# Patient Record
Sex: Female | Born: 1940 | Race: White | Hispanic: No | Smoking: Former smoker
Health system: Southern US, Community
[De-identification: ages and names within clinical notes are randomized; demographics above are authoritative.]

## PROBLEM LIST (undated history)

## (undated) DIAGNOSIS — D649 Anemia, unspecified: Secondary | ICD-10-CM

## (undated) DIAGNOSIS — F419 Anxiety disorder, unspecified: Secondary | ICD-10-CM

## (undated) DIAGNOSIS — D469 Myelodysplastic syndrome, unspecified: Secondary | ICD-10-CM

## (undated) DIAGNOSIS — F329 Major depressive disorder, single episode, unspecified: Secondary | ICD-10-CM

## (undated) HISTORY — PX: ABDOMINOPLASTY/PANNICULECTOMY: SHX5578

## (undated) HISTORY — PX: APPENDECTOMY: SHX54

## (undated) HISTORY — DX: Anxiety disorder, unspecified: F41.9

## (undated) HISTORY — DX: Myelodysplastic syndrome, unspecified: D46.9

## (undated) HISTORY — PX: OTHER SURGICAL HISTORY: SHX169

## (undated) HISTORY — PX: BREAST REDUCTION SURGERY: SHX8

## (undated) HISTORY — DX: Major depressive disorder, single episode, unspecified: F32.9

## (undated) HISTORY — PX: TUBAL LIGATION: SHX77

## (undated) HISTORY — DX: Hemochromatosis due to repeated red blood cell transfusions: E83.111

---

## 1997-11-12 ENCOUNTER — Ambulatory Visit (HOSPITAL_COMMUNITY): Admission: RE | Admit: 1997-11-12 | Discharge: 1997-11-12 | Payer: Self-pay | Admitting: *Deleted

## 1998-03-10 ENCOUNTER — Ambulatory Visit (HOSPITAL_COMMUNITY): Admission: RE | Admit: 1998-03-10 | Discharge: 1998-03-10 | Payer: Self-pay | Admitting: Gastroenterology

## 1998-04-13 ENCOUNTER — Ambulatory Visit (HOSPITAL_COMMUNITY): Admission: RE | Admit: 1998-04-13 | Discharge: 1998-04-13 | Payer: Self-pay | Admitting: *Deleted

## 1998-04-18 ENCOUNTER — Ambulatory Visit (HOSPITAL_BASED_OUTPATIENT_CLINIC_OR_DEPARTMENT_OTHER): Admission: RE | Admit: 1998-04-18 | Discharge: 1998-04-18 | Payer: Self-pay | Admitting: Specialist

## 1998-08-29 ENCOUNTER — Other Ambulatory Visit: Admission: RE | Admit: 1998-08-29 | Discharge: 1998-08-29 | Payer: Self-pay | Admitting: *Deleted

## 1999-04-17 ENCOUNTER — Ambulatory Visit (HOSPITAL_COMMUNITY): Admission: RE | Admit: 1999-04-17 | Discharge: 1999-04-17 | Payer: Self-pay | Admitting: *Deleted

## 1999-04-17 ENCOUNTER — Encounter: Payer: Self-pay | Admitting: *Deleted

## 1999-08-02 ENCOUNTER — Other Ambulatory Visit: Admission: RE | Admit: 1999-08-02 | Discharge: 1999-08-02 | Payer: Self-pay | Admitting: *Deleted

## 2000-04-18 ENCOUNTER — Encounter: Payer: Self-pay | Admitting: *Deleted

## 2000-04-18 ENCOUNTER — Ambulatory Visit (HOSPITAL_COMMUNITY): Admission: RE | Admit: 2000-04-18 | Discharge: 2000-04-18 | Payer: Self-pay | Admitting: *Deleted

## 2000-07-17 ENCOUNTER — Other Ambulatory Visit: Admission: RE | Admit: 2000-07-17 | Discharge: 2000-07-17 | Payer: Self-pay | Admitting: *Deleted

## 2001-02-19 ENCOUNTER — Ambulatory Visit (HOSPITAL_COMMUNITY): Admission: RE | Admit: 2001-02-19 | Discharge: 2001-02-19 | Payer: Self-pay | Admitting: *Deleted

## 2001-02-19 ENCOUNTER — Encounter: Payer: Self-pay | Admitting: *Deleted

## 2001-07-21 ENCOUNTER — Other Ambulatory Visit: Admission: RE | Admit: 2001-07-21 | Discharge: 2001-07-21 | Payer: Self-pay | Admitting: *Deleted

## 2001-09-05 ENCOUNTER — Observation Stay (HOSPITAL_COMMUNITY): Admission: EM | Admit: 2001-09-05 | Discharge: 2001-09-06 | Payer: Self-pay | Admitting: Emergency Medicine

## 2001-09-05 ENCOUNTER — Encounter: Payer: Self-pay | Admitting: Emergency Medicine

## 2002-02-20 ENCOUNTER — Encounter: Payer: Self-pay | Admitting: *Deleted

## 2002-02-20 ENCOUNTER — Ambulatory Visit (HOSPITAL_COMMUNITY): Admission: RE | Admit: 2002-02-20 | Discharge: 2002-02-20 | Payer: Self-pay | Admitting: *Deleted

## 2002-07-23 ENCOUNTER — Other Ambulatory Visit: Admission: RE | Admit: 2002-07-23 | Discharge: 2002-07-23 | Payer: Self-pay | Admitting: *Deleted

## 2003-02-23 ENCOUNTER — Ambulatory Visit (HOSPITAL_COMMUNITY): Admission: RE | Admit: 2003-02-23 | Discharge: 2003-02-23 | Payer: Self-pay | Admitting: *Deleted

## 2003-02-23 ENCOUNTER — Encounter: Payer: Self-pay | Admitting: *Deleted

## 2003-07-26 ENCOUNTER — Other Ambulatory Visit: Admission: RE | Admit: 2003-07-26 | Discharge: 2003-07-26 | Payer: Self-pay | Admitting: *Deleted

## 2004-02-24 ENCOUNTER — Ambulatory Visit (HOSPITAL_COMMUNITY): Admission: RE | Admit: 2004-02-24 | Discharge: 2004-02-24 | Payer: Self-pay | Admitting: *Deleted

## 2004-04-20 ENCOUNTER — Encounter: Admission: RE | Admit: 2004-04-20 | Discharge: 2004-04-20 | Payer: Self-pay | Admitting: *Deleted

## 2004-08-03 ENCOUNTER — Other Ambulatory Visit: Admission: RE | Admit: 2004-08-03 | Discharge: 2004-08-03 | Payer: Self-pay | Admitting: *Deleted

## 2005-02-27 ENCOUNTER — Ambulatory Visit (HOSPITAL_COMMUNITY): Admission: RE | Admit: 2005-02-27 | Discharge: 2005-02-27 | Payer: Self-pay | Admitting: *Deleted

## 2005-07-25 ENCOUNTER — Other Ambulatory Visit: Admission: RE | Admit: 2005-07-25 | Discharge: 2005-07-25 | Payer: Self-pay | Admitting: Gynecology

## 2006-03-05 ENCOUNTER — Ambulatory Visit (HOSPITAL_COMMUNITY): Admission: RE | Admit: 2006-03-05 | Discharge: 2006-03-05 | Payer: Self-pay | Admitting: Gynecology

## 2006-07-25 ENCOUNTER — Other Ambulatory Visit: Admission: RE | Admit: 2006-07-25 | Discharge: 2006-07-25 | Payer: Self-pay | Admitting: Gynecology

## 2006-09-16 ENCOUNTER — Ambulatory Visit: Payer: Self-pay | Admitting: Hematology & Oncology

## 2006-10-02 LAB — CBC & DIFF AND RETIC
Eosinophils Absolute: 0.6 10*3/uL — ABNORMAL HIGH (ref 0.0–0.5)
IRF: 0.35 — ABNORMAL HIGH (ref 0.130–0.330)
MONO#: 0.6 10*3/uL (ref 0.1–0.9)
NEUT#: 7 10*3/uL — ABNORMAL HIGH (ref 1.5–6.5)
Platelets: 471 10*3/uL — ABNORMAL HIGH (ref 145–400)
RBC: 3.01 10*6/uL — ABNORMAL LOW (ref 3.70–5.32)
RDW: 15.1 % — ABNORMAL HIGH (ref 11.3–14.5)
RETIC #: 88.2 10*3/uL (ref 19.7–115.1)
Retic %: 2.9 % — ABNORMAL HIGH (ref 0.4–2.3)
WBC: 12.3 10*3/uL — ABNORMAL HIGH (ref 3.9–10.0)
lymph#: 4 10*3/uL — ABNORMAL HIGH (ref 0.9–3.3)

## 2006-10-02 LAB — CHCC SMEAR

## 2006-10-04 LAB — FERRITIN: Ferritin: 793 ng/mL — ABNORMAL HIGH (ref 10–291)

## 2006-10-04 LAB — PROTEIN ELECTROPHORESIS, SERUM: Gamma Globulin: 12.3 % (ref 11.1–18.8)

## 2006-10-04 LAB — DIRECT ANTIGLOBULIN TEST (NOT AT ARMC)
DAT (Complement): NEGATIVE
DAT IgG: NEGATIVE

## 2006-10-04 LAB — VITAMIN B12: Vitamin B-12: 489 pg/mL (ref 211–911)

## 2006-10-04 LAB — HAPTOGLOBIN: Haptoglobin: 98 mg/dL (ref 16–200)

## 2006-10-16 ENCOUNTER — Ambulatory Visit (HOSPITAL_COMMUNITY): Admission: RE | Admit: 2006-10-16 | Discharge: 2006-10-16 | Payer: Self-pay | Admitting: Hematology & Oncology

## 2006-10-16 ENCOUNTER — Encounter: Payer: Self-pay | Admitting: Hematology & Oncology

## 2006-10-16 ENCOUNTER — Ambulatory Visit: Payer: Self-pay | Admitting: Hematology & Oncology

## 2006-11-14 ENCOUNTER — Ambulatory Visit: Payer: Self-pay | Admitting: Hematology & Oncology

## 2006-11-18 LAB — CBC WITH DIFFERENTIAL/PLATELET
BASO%: 0.9 % (ref 0.0–2.0)
LYMPH%: 35.9 % (ref 14.0–48.0)
MCHC: 35.5 g/dL (ref 32.0–36.0)
MONO#: 0.7 10*3/uL (ref 0.1–0.9)
RBC: 2.81 10*6/uL — ABNORMAL LOW (ref 3.70–5.32)
WBC: 12.7 10*3/uL — ABNORMAL HIGH (ref 3.9–10.0)
lymph#: 4.5 10*3/uL — ABNORMAL HIGH (ref 0.9–3.3)

## 2006-12-06 LAB — CBC & DIFF AND RETIC
Basophils Absolute: 0.4 10*3/uL — ABNORMAL HIGH (ref 0.0–0.1)
EOS%: 5.8 % (ref 0.0–7.0)
HGB: 10.5 g/dL — ABNORMAL LOW (ref 11.6–15.9)
IRF: 0.28 (ref 0.130–0.330)
MCH: 35.3 pg — ABNORMAL HIGH (ref 26.0–34.0)
NEUT#: 5 10*3/uL (ref 1.5–6.5)
RBC: 2.97 10*6/uL — ABNORMAL LOW (ref 3.70–5.32)
RDW: 16.3 % — ABNORMAL HIGH (ref 11.3–14.5)
RETIC #: 59.4 10*3/uL (ref 19.7–115.1)
Retic %: 2 % (ref 0.4–2.3)
lymph#: 4.4 10*3/uL — ABNORMAL HIGH (ref 0.9–3.3)

## 2006-12-06 LAB — CHCC SMEAR

## 2006-12-18 LAB — CBC WITH DIFFERENTIAL/PLATELET
BASO%: 0.3 % (ref 0.0–2.0)
EOS%: 4.1 % (ref 0.0–7.0)
MCH: 36 pg — ABNORMAL HIGH (ref 26.0–34.0)
MCHC: 34.9 g/dL (ref 32.0–36.0)
MCV: 103.1 fL — ABNORMAL HIGH (ref 81.0–101.0)
MONO%: 3.8 % (ref 0.0–13.0)
NEUT#: 7.4 10*3/uL — ABNORMAL HIGH (ref 1.5–6.5)
RBC: 3.05 10*6/uL — ABNORMAL LOW (ref 3.70–5.32)
RDW: 16.6 % — ABNORMAL HIGH (ref 11.3–14.5)

## 2006-12-30 ENCOUNTER — Ambulatory Visit: Payer: Self-pay | Admitting: Hematology & Oncology

## 2007-01-01 LAB — CBC WITH DIFFERENTIAL/PLATELET
BASO%: 1.6 % (ref 0.0–2.0)
Eosinophils Absolute: 0.5 10*3/uL (ref 0.0–0.5)
MCHC: 34.4 g/dL (ref 32.0–36.0)
MONO#: 0.4 10*3/uL (ref 0.1–0.9)
NEUT#: 5.8 10*3/uL (ref 1.5–6.5)
RBC: 3.23 10*6/uL — ABNORMAL LOW (ref 3.70–5.32)
RDW: 16.4 % — ABNORMAL HIGH (ref 11.3–14.5)
WBC: 11.1 10*3/uL — ABNORMAL HIGH (ref 3.9–10.0)

## 2007-01-14 ENCOUNTER — Encounter: Admission: RE | Admit: 2007-01-14 | Discharge: 2007-01-14 | Payer: Self-pay | Admitting: Family Medicine

## 2007-01-15 LAB — CBC WITH DIFFERENTIAL/PLATELET
BASO%: 1.4 % (ref 0.0–2.0)
Basophils Absolute: 0.2 10*3/uL — ABNORMAL HIGH (ref 0.0–0.1)
Eosinophils Absolute: 0.5 10*3/uL (ref 0.0–0.5)
HCT: 32.7 % — ABNORMAL LOW (ref 34.8–46.6)
HGB: 11.4 g/dL — ABNORMAL LOW (ref 11.6–15.9)
MCHC: 34.9 g/dL (ref 32.0–36.0)
MONO#: 0.5 10*3/uL (ref 0.1–0.9)
NEUT#: 6.2 10*3/uL (ref 1.5–6.5)
NEUT%: 55.2 % (ref 39.6–76.8)
WBC: 11.2 10*3/uL — ABNORMAL HIGH (ref 3.9–10.0)
lymph#: 3.9 10*3/uL — ABNORMAL HIGH (ref 0.9–3.3)

## 2007-01-22 LAB — CBC WITH DIFFERENTIAL/PLATELET
Basophils Absolute: 0.2 10*3/uL — ABNORMAL HIGH (ref 0.0–0.1)
EOS%: 4.3 % (ref 0.0–7.0)
HCT: 32.1 % — ABNORMAL LOW (ref 34.8–46.6)
HGB: 11.1 g/dL — ABNORMAL LOW (ref 11.6–15.9)
MCH: 35.6 pg — ABNORMAL HIGH (ref 26.0–34.0)
NEUT%: 56.9 % (ref 39.6–76.8)
Platelets: 614 10*3/uL — ABNORMAL HIGH (ref 145–400)
lymph#: 5 10*3/uL — ABNORMAL HIGH (ref 0.9–3.3)

## 2007-01-29 LAB — CBC WITH DIFFERENTIAL/PLATELET
Eosinophils Absolute: 0.6 10*3/uL — ABNORMAL HIGH (ref 0.0–0.5)
HCT: 32.3 % — ABNORMAL LOW (ref 34.8–46.6)
LYMPH%: 34.7 % (ref 14.0–48.0)
MCHC: 34.5 g/dL (ref 32.0–36.0)
MONO#: 0.5 10*3/uL (ref 0.1–0.9)
NEUT#: 6.2 10*3/uL (ref 1.5–6.5)
NEUT%: 54.5 % (ref 39.6–76.8)
Platelets: 335 10*3/uL (ref 145–400)
WBC: 11.4 10*3/uL — ABNORMAL HIGH (ref 3.9–10.0)

## 2007-02-12 LAB — CBC WITH DIFFERENTIAL/PLATELET
Basophils Absolute: 0.1 10*3/uL (ref 0.0–0.1)
Eosinophils Absolute: 0.4 10*3/uL (ref 0.0–0.5)
HCT: 32.3 % — ABNORMAL LOW (ref 34.8–46.6)
HGB: 11.3 g/dL — ABNORMAL LOW (ref 11.6–15.9)
LYMPH%: 36.7 % (ref 14.0–48.0)
MCV: 102.5 fL — ABNORMAL HIGH (ref 81.0–101.0)
MONO#: 0.5 10*3/uL (ref 0.1–0.9)
MONO%: 4.8 % (ref 0.0–13.0)
NEUT#: 5.6 10*3/uL (ref 1.5–6.5)
NEUT%: 53.6 % (ref 39.6–76.8)
Platelets: 344 10*3/uL (ref 145–400)
WBC: 10.4 10*3/uL — ABNORMAL HIGH (ref 3.9–10.0)

## 2007-02-24 ENCOUNTER — Ambulatory Visit: Payer: Self-pay | Admitting: Hematology & Oncology

## 2007-02-28 LAB — CBC WITH DIFFERENTIAL/PLATELET
BASO%: 0.6 % (ref 0.0–2.0)
HCT: 31.5 % — ABNORMAL LOW (ref 34.8–46.6)
LYMPH%: 32.9 % (ref 14.0–48.0)
MCHC: 35.4 g/dL (ref 32.0–36.0)
MCV: 102.9 fL — ABNORMAL HIGH (ref 81.0–101.0)
MONO#: 0.6 10*3/uL (ref 0.1–0.9)
MONO%: 4.4 % (ref 0.0–13.0)
NEUT%: 58.8 % (ref 39.6–76.8)
Platelets: 396 10*3/uL (ref 145–400)
RBC: 3.06 10*6/uL — ABNORMAL LOW (ref 3.70–5.32)
WBC: 12.8 10*3/uL — ABNORMAL HIGH (ref 3.9–10.0)

## 2007-03-10 ENCOUNTER — Ambulatory Visit (HOSPITAL_COMMUNITY): Admission: RE | Admit: 2007-03-10 | Discharge: 2007-03-10 | Payer: Self-pay | Admitting: Gynecology

## 2007-03-12 LAB — CBC & DIFF AND RETIC
BASO%: 0.8 % (ref 0.0–2.0)
Basophils Absolute: 0.1 10*3/uL (ref 0.0–0.1)
EOS%: 4.4 % (ref 0.0–7.0)
HCT: 32.9 % — ABNORMAL LOW (ref 34.8–46.6)
HGB: 11.5 g/dL — ABNORMAL LOW (ref 11.6–15.9)
MCH: 35.4 pg — ABNORMAL HIGH (ref 26.0–34.0)
MCHC: 34.8 g/dL (ref 32.0–36.0)
MCV: 101.6 fL — ABNORMAL HIGH (ref 81.0–101.0)
MONO%: 5.4 % (ref 0.0–13.0)
NEUT%: 60.1 % (ref 39.6–76.8)
RDW: 16.1 % — ABNORMAL HIGH (ref 11.3–14.5)

## 2007-04-02 LAB — CBC WITH DIFFERENTIAL/PLATELET
Basophils Absolute: 0.1 10*3/uL (ref 0.0–0.1)
Eosinophils Absolute: 0.5 10*3/uL (ref 0.0–0.5)
HGB: 9.9 g/dL — ABNORMAL LOW (ref 11.6–15.9)
LYMPH%: 27.9 % (ref 14.0–48.0)
MCV: 101.8 fL — ABNORMAL HIGH (ref 81.0–101.0)
MONO%: 5 % (ref 0.0–13.0)
NEUT#: 10.4 10*3/uL — ABNORMAL HIGH (ref 1.5–6.5)
NEUT%: 63.1 % (ref 39.6–76.8)
Platelets: 483 10*3/uL — ABNORMAL HIGH (ref 145–400)

## 2007-04-21 ENCOUNTER — Ambulatory Visit: Payer: Self-pay | Admitting: Hematology & Oncology

## 2007-04-23 LAB — CBC WITH DIFFERENTIAL/PLATELET
BASO%: 1.2 % (ref 0.0–2.0)
EOS%: 4.3 % (ref 0.0–7.0)
HCT: 28.3 % — ABNORMAL LOW (ref 34.8–46.6)
MCH: 35.7 pg — ABNORMAL HIGH (ref 26.0–34.0)
MCHC: 34.9 g/dL (ref 32.0–36.0)
MONO#: 0.7 10*3/uL (ref 0.1–0.9)
NEUT%: 49.9 % (ref 39.6–76.8)
RBC: 2.77 10*6/uL — ABNORMAL LOW (ref 3.70–5.32)
RDW: 15.8 % — ABNORMAL HIGH (ref 11.3–14.5)
WBC: 12 10*3/uL — ABNORMAL HIGH (ref 3.9–10.0)
lymph#: 4.7 10*3/uL — ABNORMAL HIGH (ref 0.9–3.3)

## 2007-05-14 LAB — CBC & DIFF AND RETIC
EOS%: 4 % (ref 0.0–7.0)
Eosinophils Absolute: 0.5 10*3/uL (ref 0.0–0.5)
HGB: 10.5 g/dL — ABNORMAL LOW (ref 11.6–15.9)
IRF: 0.36 — ABNORMAL HIGH (ref 0.130–0.330)
MCV: 103.7 fL — ABNORMAL HIGH (ref 81.0–101.0)
MONO%: 1.7 % (ref 0.0–13.0)
NEUT#: 8.6 10*3/uL — ABNORMAL HIGH (ref 1.5–6.5)
RBC: 2.92 10*6/uL — ABNORMAL LOW (ref 3.70–5.32)
RDW: 16.7 % — ABNORMAL HIGH (ref 11.3–14.5)
RETIC #: 62.5 10*3/uL (ref 19.7–115.1)
Retic %: 2.1 % (ref 0.4–2.3)
lymph#: 4 10*3/uL — ABNORMAL HIGH (ref 0.9–3.3)

## 2007-05-14 LAB — CHCC SMEAR

## 2007-06-04 LAB — CBC WITH DIFFERENTIAL/PLATELET
Basophils Absolute: 0.1 10*3/uL (ref 0.0–0.1)
EOS%: 3.8 % (ref 0.0–7.0)
Eosinophils Absolute: 0.5 10*3/uL (ref 0.0–0.5)
MCHC: 35.4 g/dL (ref 32.0–36.0)
MCV: 102.5 fL — ABNORMAL HIGH (ref 81.0–101.0)
NEUT%: 51.6 % (ref 39.6–76.8)
Platelets: 495 10*3/uL — ABNORMAL HIGH (ref 145–400)
RBC: 2.72 10*6/uL — ABNORMAL LOW (ref 3.70–5.32)
RDW: 17 % — ABNORMAL HIGH (ref 11.3–14.5)
WBC: 12.5 10*3/uL — ABNORMAL HIGH (ref 3.9–10.0)

## 2007-06-25 ENCOUNTER — Ambulatory Visit: Payer: Self-pay | Admitting: Hematology & Oncology

## 2007-06-27 LAB — CBC & DIFF AND RETIC
BASO%: 0.6 % (ref 0.0–2.0)
Basophils Absolute: 0.1 10*3/uL (ref 0.0–0.1)
EOS%: 3.6 % (ref 0.0–7.0)
HCT: 28.1 % — ABNORMAL LOW (ref 34.8–46.6)
LYMPH%: 30.4 % (ref 14.0–48.0)
MCH: 36.4 pg — ABNORMAL HIGH (ref 26.0–34.0)
MCHC: 35.2 g/dL (ref 32.0–36.0)
MCV: 103.5 fL — ABNORMAL HIGH (ref 81.0–101.0)
MONO%: 4.7 % (ref 0.0–13.0)
NEUT%: 60.7 % (ref 39.6–76.8)
Platelets: 490 10*3/uL — ABNORMAL HIGH (ref 145–400)
lymph#: 3.9 10*3/uL — ABNORMAL HIGH (ref 0.9–3.3)

## 2007-06-27 LAB — CHCC SMEAR

## 2007-07-11 LAB — CBC WITH DIFFERENTIAL/PLATELET
Basophils Absolute: 0.1 10*3/uL (ref 0.0–0.1)
EOS%: 4.1 % (ref 0.0–7.0)
Eosinophils Absolute: 0.5 10*3/uL (ref 0.0–0.5)
HCT: 32.3 % — ABNORMAL LOW (ref 34.8–46.6)
HGB: 11 g/dL — ABNORMAL LOW (ref 11.6–15.9)
MCH: 35 pg — ABNORMAL HIGH (ref 26.0–34.0)
MCV: 103 fL — ABNORMAL HIGH (ref 81.0–101.0)
MONO%: 7 % (ref 0.0–13.0)
NEUT#: 7.4 10*3/uL — ABNORMAL HIGH (ref 1.5–6.5)
NEUT%: 57.5 % (ref 39.6–76.8)

## 2007-07-25 LAB — CBC WITH DIFFERENTIAL/PLATELET
Basophils Absolute: 0.1 10*3/uL (ref 0.0–0.1)
EOS%: 2.7 % (ref 0.0–7.0)
Eosinophils Absolute: 0.3 10*3/uL (ref 0.0–0.5)
HGB: 11.3 g/dL — ABNORMAL LOW (ref 11.6–15.9)
LYMPH%: 32.3 % (ref 14.0–48.0)
MCH: 35.4 pg — ABNORMAL HIGH (ref 26.0–34.0)
MCV: 104.4 fL — ABNORMAL HIGH (ref 81.0–101.0)
MONO%: 3.4 % (ref 0.0–13.0)
NEUT#: 7.3 10*3/uL — ABNORMAL HIGH (ref 1.5–6.5)
Platelets: 418 10*3/uL — ABNORMAL HIGH (ref 145–400)
RBC: 3.18 10*6/uL — ABNORMAL LOW (ref 3.70–5.32)
RDW: 16.8 % — ABNORMAL HIGH (ref 11.3–14.5)

## 2007-07-29 ENCOUNTER — Other Ambulatory Visit: Admission: RE | Admit: 2007-07-29 | Discharge: 2007-07-29 | Payer: Self-pay | Admitting: Gynecology

## 2007-08-08 LAB — COMPREHENSIVE METABOLIC PANEL
ALT: 30 U/L (ref 0–35)
AST: 35 U/L (ref 0–37)
Alkaline Phosphatase: 85 U/L (ref 39–117)
BUN: 12 mg/dL (ref 6–23)
Calcium: 9.4 mg/dL (ref 8.4–10.5)
Creatinine, Ser: 0.61 mg/dL (ref 0.40–1.20)
Total Bilirubin: 1.4 mg/dL — ABNORMAL HIGH (ref 0.3–1.2)

## 2007-08-08 LAB — CBC & DIFF AND RETIC
BASO%: 0.8 % (ref 0.0–2.0)
EOS%: 5 % (ref 0.0–7.0)
HGB: 10.3 g/dL — ABNORMAL LOW (ref 11.6–15.9)
IRF: 0.37 — ABNORMAL HIGH (ref 0.130–0.330)
MCH: 35 pg — ABNORMAL HIGH (ref 26.0–34.0)
MCHC: 34.1 g/dL (ref 32.0–36.0)
MONO#: 0.5 10*3/uL (ref 0.1–0.9)
RDW: 16.4 % — ABNORMAL HIGH (ref 11.3–14.5)
RETIC #: 57.5 10*3/uL (ref 19.7–115.1)
WBC: 14.9 10*3/uL — ABNORMAL HIGH (ref 3.9–10.0)
lymph#: 4.1 10*3/uL — ABNORMAL HIGH (ref 0.9–3.3)

## 2007-08-08 LAB — FERRITIN: Ferritin: 1091 ng/mL — ABNORMAL HIGH (ref 10–291)

## 2007-08-20 ENCOUNTER — Ambulatory Visit: Payer: Self-pay | Admitting: Oncology

## 2007-08-22 LAB — CBC WITH DIFFERENTIAL/PLATELET
Basophils Absolute: 0.1 10*3/uL (ref 0.0–0.1)
Eosinophils Absolute: 0.6 10*3/uL — ABNORMAL HIGH (ref 0.0–0.5)
HCT: 30.3 % — ABNORMAL LOW (ref 34.8–46.6)
HGB: 10.6 g/dL — ABNORMAL LOW (ref 11.6–15.9)
MCV: 102.7 fL — ABNORMAL HIGH (ref 81.0–101.0)
MONO%: 6.3 % (ref 0.0–13.0)
NEUT#: 6.2 10*3/uL (ref 1.5–6.5)
Platelets: 439 10*3/uL — ABNORMAL HIGH (ref 145–400)
RDW: 16.5 % — ABNORMAL HIGH (ref 11.3–14.5)

## 2007-09-05 LAB — CBC WITH DIFFERENTIAL/PLATELET
BASO%: 0.7 % (ref 0.0–2.0)
Basophils Absolute: 0.1 10*3/uL (ref 0.0–0.1)
EOS%: 2.6 % (ref 0.0–7.0)
HGB: 11.1 g/dL — ABNORMAL LOW (ref 11.6–15.9)
MCH: 36.3 pg — ABNORMAL HIGH (ref 26.0–34.0)
RDW: 16.3 % — ABNORMAL HIGH (ref 11.3–14.5)
lymph#: 3.5 10*3/uL — ABNORMAL HIGH (ref 0.9–3.3)

## 2007-09-19 LAB — CBC & DIFF AND RETIC
BASO%: 0.7 % (ref 0.0–2.0)
EOS%: 4.2 % (ref 0.0–7.0)
HCT: 31.8 % — ABNORMAL LOW (ref 34.8–46.6)
LYMPH%: 33.2 % (ref 14.0–48.0)
MCH: 35.8 pg — ABNORMAL HIGH (ref 26.0–34.0)
MCHC: 34.3 g/dL (ref 32.0–36.0)
MCV: 104.5 fL — ABNORMAL HIGH (ref 81.0–101.0)
MONO%: 7.5 % (ref 0.0–13.0)
NEUT%: 54.4 % (ref 39.6–76.8)
Platelets: 396 10*3/uL (ref 145–400)

## 2007-09-22 LAB — FERRITIN: Ferritin: 1179 ng/mL — ABNORMAL HIGH (ref 10–291)

## 2007-09-26 ENCOUNTER — Emergency Department (HOSPITAL_COMMUNITY): Admission: EM | Admit: 2007-09-26 | Discharge: 2007-09-26 | Payer: Self-pay | Admitting: Emergency Medicine

## 2007-11-11 ENCOUNTER — Ambulatory Visit: Payer: Self-pay | Admitting: Oncology

## 2007-11-14 LAB — CBC WITH DIFFERENTIAL/PLATELET
BASO%: 0.4 % (ref 0.0–2.0)
LYMPH%: 31 % (ref 14.0–48.0)
MCHC: 34.8 g/dL (ref 32.0–36.0)
MONO#: 0.9 10*3/uL (ref 0.1–0.9)
RBC: 2.5 10*6/uL — ABNORMAL LOW (ref 3.70–5.32)
RDW: 16.3 % — ABNORMAL HIGH (ref 11.3–14.5)
WBC: 13.9 10*3/uL — ABNORMAL HIGH (ref 3.9–10.0)
lymph#: 4.3 10*3/uL — ABNORMAL HIGH (ref 0.9–3.3)

## 2007-11-14 LAB — MORPHOLOGY: PLT EST: INCREASED

## 2008-01-07 ENCOUNTER — Ambulatory Visit: Payer: Self-pay | Admitting: Oncology

## 2008-01-09 LAB — MORPHOLOGY: PLT EST: INCREASED

## 2008-01-09 LAB — CBC WITH DIFFERENTIAL/PLATELET
Basophils Absolute: 0.2 10*3/uL — ABNORMAL HIGH (ref 0.0–0.1)
Eosinophils Absolute: 1.1 10*3/uL — ABNORMAL HIGH (ref 0.0–0.5)
MCV: 103.3 fL — ABNORMAL HIGH (ref 81.0–101.0)
NEUT%: 53.2 % (ref 39.6–76.8)
Platelets: 511 10*3/uL — ABNORMAL HIGH (ref 145–400)
RDW: 17.9 % — ABNORMAL HIGH (ref 11.3–14.5)
WBC: 15.8 10*3/uL — ABNORMAL HIGH (ref 3.9–10.0)
lymph#: 5.5 10*3/uL — ABNORMAL HIGH (ref 0.9–3.3)

## 2008-01-22 LAB — CBC WITH DIFFERENTIAL/PLATELET
Basophils Absolute: 0.1 10*3/uL (ref 0.0–0.1)
EOS%: 5.8 % (ref 0.0–7.0)
HGB: 8.9 g/dL — ABNORMAL LOW (ref 11.6–15.9)
MCH: 35.5 pg — ABNORMAL HIGH (ref 26.0–34.0)
MCV: 103.1 fL — ABNORMAL HIGH (ref 81.0–101.0)
MONO%: 6.1 % (ref 0.0–13.0)
RBC: 2.52 10*6/uL — ABNORMAL LOW (ref 3.70–5.32)
RDW: 17.8 % — ABNORMAL HIGH (ref 11.3–14.5)

## 2008-01-22 LAB — MORPHOLOGY
Blasts: 1 % (ref 0–0)
PLT EST: INCREASED

## 2008-02-13 ENCOUNTER — Encounter: Admission: RE | Admit: 2008-02-13 | Discharge: 2008-02-13 | Payer: Self-pay | Admitting: Gynecology

## 2008-02-29 ENCOUNTER — Ambulatory Visit: Payer: Self-pay | Admitting: Oncology

## 2008-03-05 LAB — CBC WITH DIFFERENTIAL/PLATELET
Basophils Absolute: 0.1 10*3/uL (ref 0.0–0.1)
Eosinophils Absolute: 0.6 10*3/uL — ABNORMAL HIGH (ref 0.0–0.5)
HGB: 9 g/dL — ABNORMAL LOW (ref 11.6–15.9)
LYMPH%: 29.6 % (ref 14.0–48.0)
MCV: 103.8 fL — ABNORMAL HIGH (ref 81.0–101.0)
MONO%: 6.8 % (ref 0.0–13.0)
NEUT#: 10.2 10*3/uL — ABNORMAL HIGH (ref 1.5–6.5)
NEUT%: 59.2 % (ref 39.6–76.8)
Platelets: 585 10*3/uL — ABNORMAL HIGH (ref 145–400)

## 2008-03-05 LAB — MORPHOLOGY: PLT EST: INCREASED

## 2008-03-12 ENCOUNTER — Ambulatory Visit (HOSPITAL_COMMUNITY): Admission: RE | Admit: 2008-03-12 | Discharge: 2008-03-12 | Payer: Self-pay | Admitting: Gynecology

## 2008-03-19 LAB — CBC & DIFF AND RETIC
BASO%: 0.1 % (ref 0.0–2.0)
Eosinophils Absolute: 0.6 10*3/uL — ABNORMAL HIGH (ref 0.0–0.5)
HCT: 25.9 % — ABNORMAL LOW (ref 34.8–46.6)
HGB: 8.8 g/dL — ABNORMAL LOW (ref 11.6–15.9)
IRF: 0.46 — ABNORMAL HIGH (ref 0.130–0.330)
MCHC: 34.1 g/dL (ref 32.0–36.0)
MONO#: 0.3 10*3/uL (ref 0.1–0.9)
NEUT#: 8.4 10*3/uL — ABNORMAL HIGH (ref 1.5–6.5)
NEUT%: 61.3 % (ref 39.6–76.8)
Platelets: 516 10*3/uL — ABNORMAL HIGH (ref 145–400)
WBC: 13.7 10*3/uL — ABNORMAL HIGH (ref 3.9–10.0)
lymph#: 4.4 10*3/uL — ABNORMAL HIGH (ref 0.9–3.3)

## 2008-05-19 ENCOUNTER — Ambulatory Visit: Payer: Self-pay | Admitting: Oncology

## 2008-05-21 LAB — CBC WITH DIFFERENTIAL/PLATELET
BASO%: 0.7 % (ref 0.0–2.0)
EOS%: 3.5 % (ref 0.0–7.0)
LYMPH%: 35.7 % (ref 14.0–48.0)
MCH: 36.3 pg — ABNORMAL HIGH (ref 26.0–34.0)
MCHC: 34.4 g/dL (ref 32.0–36.0)
MCV: 105.7 fL — ABNORMAL HIGH (ref 81.0–101.0)
MONO%: 4.3 % (ref 0.0–13.0)
NEUT#: 7.3 10*3/uL — ABNORMAL HIGH (ref 1.5–6.5)
Platelets: 501 10*3/uL — ABNORMAL HIGH (ref 145–400)
RBC: 2.36 10*6/uL — ABNORMAL LOW (ref 3.70–5.32)
RDW: 18.2 % — ABNORMAL HIGH (ref 11.3–14.5)

## 2008-05-21 LAB — MORPHOLOGY: PLT EST: INCREASED

## 2008-07-26 ENCOUNTER — Ambulatory Visit: Payer: Self-pay | Admitting: Oncology

## 2008-07-28 LAB — CBC WITH DIFFERENTIAL/PLATELET
Basophils Absolute: 0.1 10*3/uL (ref 0.0–0.1)
Eosinophils Absolute: 0.5 10*3/uL (ref 0.0–0.5)
HGB: 9.4 g/dL — ABNORMAL LOW (ref 11.6–15.9)
LYMPH%: 22.7 % (ref 14.0–48.0)
MCV: 106.6 fL — ABNORMAL HIGH (ref 81.0–101.0)
MONO#: 0.9 10*3/uL (ref 0.1–0.9)
MONO%: 8.7 % (ref 0.0–13.0)
NEUT#: 6.8 10*3/uL — ABNORMAL HIGH (ref 1.5–6.5)
Platelets: 440 10*3/uL — ABNORMAL HIGH (ref 145–400)
RBC: 2.61 10*6/uL — ABNORMAL LOW (ref 3.70–5.32)
RDW: 18.8 % — ABNORMAL HIGH (ref 11.3–14.5)
WBC: 10.9 10*3/uL — ABNORMAL HIGH (ref 3.9–10.0)

## 2008-07-28 LAB — MORPHOLOGY

## 2008-09-14 ENCOUNTER — Ambulatory Visit: Payer: Self-pay | Admitting: Oncology

## 2008-09-16 LAB — CBC WITH DIFFERENTIAL/PLATELET
Basophils Absolute: 0.1 10*3/uL (ref 0.0–0.1)
Eosinophils Absolute: 0.5 10*3/uL (ref 0.0–0.5)
HGB: 8.4 g/dL — ABNORMAL LOW (ref 11.6–15.9)
NEUT#: 6.9 10*3/uL — ABNORMAL HIGH (ref 1.5–6.5)
RBC: 2.48 10*6/uL — ABNORMAL LOW (ref 3.70–5.45)
RDW: 19.7 % — ABNORMAL HIGH (ref 11.2–14.5)
WBC: 13.6 10*3/uL — ABNORMAL HIGH (ref 3.9–10.3)
lymph#: 5.1 10*3/uL — ABNORMAL HIGH (ref 0.9–3.3)

## 2008-09-16 LAB — MORPHOLOGY

## 2008-10-06 ENCOUNTER — Inpatient Hospital Stay (HOSPITAL_COMMUNITY): Admission: EM | Admit: 2008-10-06 | Discharge: 2008-10-10 | Payer: Self-pay | Admitting: Emergency Medicine

## 2008-10-06 ENCOUNTER — Encounter: Admission: RE | Admit: 2008-10-06 | Discharge: 2008-10-06 | Payer: Self-pay | Admitting: Family Medicine

## 2008-10-06 ENCOUNTER — Encounter (INDEPENDENT_AMBULATORY_CARE_PROVIDER_SITE_OTHER): Payer: Self-pay | Admitting: General Surgery

## 2008-10-26 LAB — CBC WITH DIFFERENTIAL/PLATELET
Eosinophils Absolute: 0.7 10*3/uL — ABNORMAL HIGH (ref 0.0–0.5)
HCT: 28.9 % — ABNORMAL LOW (ref 34.8–46.6)
LYMPH%: 37.7 % (ref 14.0–49.7)
MCHC: 33.6 g/dL (ref 31.5–36.0)
MCV: 96 fL (ref 79.5–101.0)
MONO#: 1.1 10*3/uL — ABNORMAL HIGH (ref 0.1–0.9)
MONO%: 8.3 % (ref 0.0–14.0)
NEUT#: 6.3 10*3/uL (ref 1.5–6.5)
NEUT%: 47.7 % (ref 38.4–76.8)
Platelets: 629 10*3/uL — ABNORMAL HIGH (ref 145–400)
RBC: 3.01 10*6/uL — ABNORMAL LOW (ref 3.70–5.45)
WBC: 13.1 10*3/uL — ABNORMAL HIGH (ref 3.9–10.3)
nRBC: 0 % (ref 0–0)

## 2008-12-15 ENCOUNTER — Ambulatory Visit: Payer: Self-pay | Admitting: Oncology

## 2008-12-17 LAB — MORPHOLOGY: PLT EST: INCREASED

## 2008-12-17 LAB — CBC & DIFF AND RETIC
BASO%: 0.8 % (ref 0.0–2.0)
Eosinophils Absolute: 0.6 10*3/uL — ABNORMAL HIGH (ref 0.0–0.5)
MCHC: 33.7 g/dL (ref 31.5–36.0)
MONO#: 1.3 10*3/uL — ABNORMAL HIGH (ref 0.1–0.9)
NEUT#: 10 10*3/uL — ABNORMAL HIGH (ref 1.5–6.5)
RBC: 2.66 10*6/uL — ABNORMAL LOW (ref 3.70–5.45)
RDW: 22.5 % — ABNORMAL HIGH (ref 11.2–14.5)
RETIC #: 92 10*3/uL (ref 19.7–115.1)
Retic %: 3.5 % — ABNORMAL HIGH (ref 0.4–2.3)
WBC: 18 10*3/uL — ABNORMAL HIGH (ref 3.9–10.3)
lymph#: 6 10*3/uL — ABNORMAL HIGH (ref 0.9–3.3)

## 2008-12-17 LAB — COMPREHENSIVE METABOLIC PANEL
AST: 23 U/L (ref 0–37)
Albumin: 4.5 g/dL (ref 3.5–5.2)
Alkaline Phosphatase: 69 U/L (ref 39–117)
Potassium: 4.1 mEq/L (ref 3.5–5.3)
Sodium: 138 mEq/L (ref 135–145)
Total Bilirubin: 2.2 mg/dL — ABNORMAL HIGH (ref 0.3–1.2)
Total Protein: 7.1 g/dL (ref 6.0–8.3)

## 2009-02-08 ENCOUNTER — Ambulatory Visit: Payer: Self-pay | Admitting: Oncology

## 2009-02-11 LAB — CBC WITH DIFFERENTIAL/PLATELET
Basophils Absolute: 0 10*3/uL (ref 0.0–0.1)
Eosinophils Absolute: 0.3 10*3/uL (ref 0.0–0.5)
HCT: 25 % — ABNORMAL LOW (ref 34.8–46.6)
HGB: 8.6 g/dL — ABNORMAL LOW (ref 11.6–15.9)
LYMPH%: 23.9 % (ref 14.0–49.7)
MCHC: 34.3 g/dL (ref 31.5–36.0)
MONO#: 0.7 10*3/uL (ref 0.1–0.9)
NEUT#: 10.3 10*3/uL — ABNORMAL HIGH (ref 1.5–6.5)
NEUT%: 69.1 % (ref 38.4–76.8)
Platelets: 474 10*3/uL — ABNORMAL HIGH (ref 145–400)
WBC: 14.9 10*3/uL — ABNORMAL HIGH (ref 3.9–10.3)

## 2009-02-11 LAB — MORPHOLOGY: PLT EST: INCREASED

## 2009-02-18 ENCOUNTER — Emergency Department (HOSPITAL_COMMUNITY): Admission: EM | Admit: 2009-02-18 | Discharge: 2009-02-18 | Payer: Self-pay | Admitting: Emergency Medicine

## 2009-03-22 ENCOUNTER — Ambulatory Visit (HOSPITAL_COMMUNITY): Admission: RE | Admit: 2009-03-22 | Discharge: 2009-03-22 | Payer: Self-pay | Admitting: Obstetrics & Gynecology

## 2009-04-06 ENCOUNTER — Ambulatory Visit: Payer: Self-pay | Admitting: Oncology

## 2009-04-08 LAB — CBC WITH DIFFERENTIAL/PLATELET
BASO%: 0.5 % (ref 0.0–2.0)
LYMPH%: 32.4 % (ref 14.0–49.7)
MCHC: 34.9 g/dL (ref 31.5–36.0)
MONO#: 0.5 10*3/uL (ref 0.1–0.9)
Platelets: 540 10*3/uL — ABNORMAL HIGH (ref 145–400)
RBC: 2.34 10*6/uL — ABNORMAL LOW (ref 3.70–5.45)
WBC: 12.6 10*3/uL — ABNORMAL HIGH (ref 3.9–10.3)

## 2009-04-08 LAB — MORPHOLOGY

## 2009-05-18 ENCOUNTER — Ambulatory Visit: Payer: Self-pay | Admitting: Oncology

## 2009-05-20 LAB — CBC WITH DIFFERENTIAL/PLATELET
Basophils Absolute: 0.1 10*3/uL (ref 0.0–0.1)
Eosinophils Absolute: 0.5 10*3/uL (ref 0.0–0.5)
HGB: 8.5 g/dL — ABNORMAL LOW (ref 11.6–15.9)
MONO#: 0.9 10*3/uL (ref 0.1–0.9)
NEUT#: 8.1 10*3/uL — ABNORMAL HIGH (ref 1.5–6.5)
RDW: 18.9 % — ABNORMAL HIGH (ref 11.2–14.5)
WBC: 14.4 10*3/uL — ABNORMAL HIGH (ref 3.9–10.3)
lymph#: 4.7 10*3/uL — ABNORMAL HIGH (ref 0.9–3.3)

## 2009-05-20 LAB — MORPHOLOGY

## 2009-07-12 ENCOUNTER — Ambulatory Visit: Payer: Self-pay | Admitting: Oncology

## 2009-07-14 LAB — MORPHOLOGY: PLT EST: INCREASED

## 2009-07-14 LAB — CBC WITH DIFFERENTIAL/PLATELET
Basophils Absolute: 0.2 10*3/uL — ABNORMAL HIGH (ref 0.0–0.1)
EOS%: 2.8 % (ref 0.0–7.0)
HGB: 8.9 g/dL — ABNORMAL LOW (ref 11.6–15.9)
LYMPH%: 29.4 % (ref 14.0–49.7)
MCH: 37.3 pg — ABNORMAL HIGH (ref 25.1–34.0)
MCV: 110 fL — ABNORMAL HIGH (ref 79.5–101.0)
MONO%: 5.1 % (ref 0.0–14.0)
RDW: 19.3 % — ABNORMAL HIGH (ref 11.2–14.5)

## 2009-09-06 ENCOUNTER — Ambulatory Visit: Payer: Self-pay | Admitting: Oncology

## 2009-09-08 LAB — CBC WITH DIFFERENTIAL/PLATELET
BASO%: 0.6 % (ref 0.0–2.0)
Basophils Absolute: 0.1 10*3/uL (ref 0.0–0.1)
Eosinophils Absolute: 0.5 10*3/uL (ref 0.0–0.5)
HCT: 25.2 % — ABNORMAL LOW (ref 34.8–46.6)
HGB: 8.5 g/dL — ABNORMAL LOW (ref 11.6–15.9)
MCHC: 33.9 g/dL (ref 31.5–36.0)
MONO#: 0.7 10*3/uL (ref 0.1–0.9)
NEUT#: 10.4 10*3/uL — ABNORMAL HIGH (ref 1.5–6.5)
NEUT%: 61.9 % (ref 38.4–76.8)
WBC: 16.9 10*3/uL — ABNORMAL HIGH (ref 3.9–10.3)
lymph#: 5.1 10*3/uL — ABNORMAL HIGH (ref 0.9–3.3)

## 2009-09-08 LAB — MORPHOLOGY

## 2009-10-05 ENCOUNTER — Ambulatory Visit: Payer: Self-pay | Admitting: Oncology

## 2009-10-07 LAB — COMPREHENSIVE METABOLIC PANEL
Alkaline Phosphatase: 59 U/L (ref 39–117)
BUN: 13 mg/dL (ref 6–23)
Creatinine, Ser: 0.64 mg/dL (ref 0.40–1.20)
Glucose, Bld: 100 mg/dL — ABNORMAL HIGH (ref 70–99)
Total Bilirubin: 2.1 mg/dL — ABNORMAL HIGH (ref 0.3–1.2)

## 2009-10-07 LAB — MORPHOLOGY

## 2009-10-07 LAB — CBC WITH DIFFERENTIAL/PLATELET
Eosinophils Absolute: 0.5 10*3/uL (ref 0.0–0.5)
LYMPH%: 33.6 % (ref 14.0–49.7)
MONO#: 0.7 10*3/uL (ref 0.1–0.9)
NEUT#: 9.1 10*3/uL — ABNORMAL HIGH (ref 1.5–6.5)
Platelets: 587 10*3/uL — ABNORMAL HIGH (ref 145–400)
RBC: 2.44 10*6/uL — ABNORMAL LOW (ref 3.70–5.45)
WBC: 15.9 10*3/uL — ABNORMAL HIGH (ref 3.9–10.3)
lymph#: 5.3 10*3/uL — ABNORMAL HIGH (ref 0.9–3.3)

## 2009-10-07 LAB — URIC ACID: Uric Acid, Serum: 6.9 mg/dL (ref 2.4–7.0)

## 2009-12-01 ENCOUNTER — Ambulatory Visit: Payer: Self-pay | Admitting: Oncology

## 2009-12-02 LAB — MORPHOLOGY: PLT EST: INCREASED

## 2009-12-02 LAB — CBC WITH DIFFERENTIAL/PLATELET
Basophils Absolute: 0.1 10*3/uL (ref 0.0–0.1)
EOS%: 3.1 % (ref 0.0–7.0)
Eosinophils Absolute: 0.5 10*3/uL (ref 0.0–0.5)
HGB: 8.6 g/dL — ABNORMAL LOW (ref 11.6–15.9)
MCH: 34.8 pg — ABNORMAL HIGH (ref 25.1–34.0)
NEUT#: 7.8 10*3/uL — ABNORMAL HIGH (ref 1.5–6.5)
RDW: 19.9 % — ABNORMAL HIGH (ref 11.2–14.5)
lymph#: 5.8 10*3/uL — ABNORMAL HIGH (ref 0.9–3.3)
nRBC: 1 % — ABNORMAL HIGH (ref 0–0)

## 2010-01-13 ENCOUNTER — Ambulatory Visit: Payer: Self-pay | Admitting: Oncology

## 2010-01-13 LAB — CBC WITH DIFFERENTIAL/PLATELET
Eosinophils Absolute: 0.5 10*3/uL (ref 0.0–0.5)
HCT: 23.1 % — ABNORMAL LOW (ref 34.8–46.6)
LYMPH%: 30.8 % (ref 14.0–49.7)
MCHC: 33.8 g/dL (ref 31.5–36.0)
MCV: 106.1 fL — ABNORMAL HIGH (ref 79.5–101.0)
MONO#: 0.6 10*3/uL (ref 0.1–0.9)
MONO%: 4.2 % (ref 0.0–14.0)
NEUT%: 61.8 % (ref 38.4–76.8)
Platelets: 583 10*3/uL — ABNORMAL HIGH (ref 145–400)
RBC: 2.18 10*6/uL — ABNORMAL LOW (ref 3.70–5.45)
WBC: 15.6 10*3/uL — ABNORMAL HIGH (ref 3.9–10.3)

## 2010-01-13 LAB — MORPHOLOGY: PLT EST: INCREASED

## 2010-01-18 LAB — CBC WITH DIFFERENTIAL/PLATELET
Basophils Absolute: 0.1 10*3/uL (ref 0.0–0.1)
EOS%: 3 % (ref 0.0–7.0)
HGB: 8.1 g/dL — ABNORMAL LOW (ref 11.6–15.9)
MCH: 33.3 pg (ref 25.1–34.0)
MCHC: 32.1 g/dL (ref 31.5–36.0)
MCV: 103.7 fL — ABNORMAL HIGH (ref 79.5–101.0)
MONO%: 5.2 % (ref 0.0–14.0)
RDW: 25.5 % — ABNORMAL HIGH (ref 11.2–14.5)

## 2010-01-18 LAB — HOLD TUBE, BLOOD BANK

## 2010-01-30 LAB — CBC & DIFF AND RETIC
BASO%: 0.5 % (ref 0.0–2.0)
LYMPH%: 27.5 % (ref 14.0–49.7)
MCHC: 32.2 g/dL (ref 31.5–36.0)
MCV: 103.4 fL — ABNORMAL HIGH (ref 79.5–101.0)
MONO#: 1.2 10*3/uL — ABNORMAL HIGH (ref 0.1–0.9)
MONO%: 6.4 % (ref 0.0–14.0)
Platelets: 674 10*3/uL — ABNORMAL HIGH (ref 145–400)
RBC: 2.34 10*6/uL — ABNORMAL LOW (ref 3.70–5.45)
WBC: 19.1 10*3/uL — ABNORMAL HIGH (ref 3.9–10.3)
nRBC: 1 % — ABNORMAL HIGH (ref 0–0)

## 2010-01-31 LAB — ERYTHROPOIETIN: Erythropoietin: 99.5 m[IU]/mL — ABNORMAL HIGH (ref 2.6–34.0)

## 2010-01-31 LAB — IRON AND TIBC
%SAT: 39 % (ref 20–55)
TIBC: 274 ug/dL (ref 250–470)

## 2010-02-16 ENCOUNTER — Ambulatory Visit: Payer: Self-pay | Admitting: Oncology

## 2010-02-20 LAB — CBC WITH DIFFERENTIAL/PLATELET
Basophils Absolute: 0.2 10*3/uL — ABNORMAL HIGH (ref 0.0–0.1)
Eosinophils Absolute: 0.6 10*3/uL — ABNORMAL HIGH (ref 0.0–0.5)
HCT: 24.4 % — ABNORMAL LOW (ref 34.8–46.6)
HGB: 8.4 g/dL — ABNORMAL LOW (ref 11.6–15.9)
LYMPH%: 30.5 % (ref 14.0–49.7)
MCV: 106.1 fL — ABNORMAL HIGH (ref 79.5–101.0)
MONO#: 1 10*3/uL — ABNORMAL HIGH (ref 0.1–0.9)
MONO%: 5.7 % (ref 0.0–14.0)
NEUT#: 10.3 10*3/uL — ABNORMAL HIGH (ref 1.5–6.5)
Platelets: 565 10*3/uL — ABNORMAL HIGH (ref 145–400)
RBC: 2.3 10*6/uL — ABNORMAL LOW (ref 3.70–5.45)
WBC: 17.5 10*3/uL — ABNORMAL HIGH (ref 3.9–10.3)

## 2010-03-21 ENCOUNTER — Ambulatory Visit: Payer: Self-pay | Admitting: Oncology

## 2010-03-24 LAB — CBC WITH DIFFERENTIAL/PLATELET
BASO%: 0.2 % (ref 0.0–2.0)
EOS%: 0.7 % (ref 0.0–7.0)
Eosinophils Absolute: 0.2 10*3/uL (ref 0.0–0.5)
LYMPH%: 12.9 % — ABNORMAL LOW (ref 14.0–49.7)
MCH: 34 pg (ref 25.1–34.0)
MCHC: 32.4 g/dL (ref 31.5–36.0)
MCV: 105.1 fL — ABNORMAL HIGH (ref 79.5–101.0)
MONO%: 5.5 % (ref 0.0–14.0)
NEUT#: 20.1 10*3/uL — ABNORMAL HIGH (ref 1.5–6.5)
Platelets: 853 10*3/uL — ABNORMAL HIGH (ref 145–400)
RBC: 2.62 10*6/uL — ABNORMAL LOW (ref 3.70–5.45)
RDW: 24 % — ABNORMAL HIGH (ref 11.2–14.5)

## 2010-03-24 LAB — MORPHOLOGY

## 2010-04-20 ENCOUNTER — Ambulatory Visit (HOSPITAL_COMMUNITY): Admission: RE | Admit: 2010-04-20 | Discharge: 2010-04-20 | Payer: Self-pay | Admitting: Obstetrics & Gynecology

## 2010-04-20 ENCOUNTER — Ambulatory Visit: Payer: Self-pay | Admitting: Oncology

## 2010-04-24 LAB — CBC WITH DIFFERENTIAL/PLATELET
Basophils Absolute: 0 10*3/uL (ref 0.0–0.1)
Eosinophils Absolute: 0.4 10*3/uL (ref 0.0–0.5)
HGB: 7.8 g/dL — ABNORMAL LOW (ref 11.6–15.9)
LYMPH%: 18.2 % (ref 14.0–49.7)
MCV: 102.1 fL — ABNORMAL HIGH (ref 79.5–101.0)
MONO%: 6.1 % (ref 0.0–14.0)
NEUT#: 11.2 10*3/uL — ABNORMAL HIGH (ref 1.5–6.5)
NEUT%: 72.6 % (ref 38.4–76.8)
Platelets: 585 10*3/uL — ABNORMAL HIGH (ref 145–400)

## 2010-04-24 LAB — MORPHOLOGY: PLT EST: INCREASED

## 2010-04-24 LAB — CHCC SMEAR

## 2010-05-01 LAB — CBC WITH DIFFERENTIAL/PLATELET
BASO%: 0.7 % (ref 0.0–2.0)
EOS%: 3.8 % (ref 0.0–7.0)
HCT: 22.8 % — ABNORMAL LOW (ref 34.8–46.6)
MCH: 34.5 pg — ABNORMAL HIGH (ref 25.1–34.0)
MCHC: 33.8 g/dL (ref 31.5–36.0)
NEUT%: 60.4 % (ref 38.4–76.8)
RDW: 24.9 % — ABNORMAL HIGH (ref 11.2–14.5)
lymph#: 3.8 10*3/uL — ABNORMAL HIGH (ref 0.9–3.3)

## 2010-05-09 ENCOUNTER — Encounter (HOSPITAL_COMMUNITY)
Admission: RE | Admit: 2010-05-09 | Discharge: 2010-07-14 | Payer: Self-pay | Source: Home / Self Care | Attending: Oncology | Admitting: Oncology

## 2010-05-09 LAB — CBC WITH DIFFERENTIAL/PLATELET
BASO%: 0.7 % (ref 0.0–2.0)
Basophils Absolute: 0.1 10*3/uL (ref 0.0–0.1)
EOS%: 2.6 % (ref 0.0–7.0)
HGB: 7.7 g/dL — ABNORMAL LOW (ref 11.6–15.9)
MCH: 34.5 pg — ABNORMAL HIGH (ref 25.1–34.0)
RDW: 25.9 % — ABNORMAL HIGH (ref 11.2–14.5)
lymph#: 3.5 10*3/uL — ABNORMAL HIGH (ref 0.9–3.3)

## 2010-05-09 LAB — TYPE & CROSSMATCH - CHCC

## 2010-05-25 ENCOUNTER — Ambulatory Visit: Payer: Self-pay | Admitting: Oncology

## 2010-05-29 LAB — CBC WITH DIFFERENTIAL/PLATELET
BASO%: 2 % (ref 0.0–2.0)
Basophils Absolute: 0.3 10*3/uL — ABNORMAL HIGH (ref 0.0–0.1)
EOS%: 3.2 % (ref 0.0–7.0)
HCT: 24.4 % — ABNORMAL LOW (ref 34.8–46.6)
HGB: 8.5 g/dL — ABNORMAL LOW (ref 11.6–15.9)
MCH: 33.7 pg (ref 25.1–34.0)
MCHC: 34.7 g/dL (ref 31.5–36.0)
MCV: 97.3 fL (ref 79.5–101.0)
MONO%: 6.1 % (ref 0.0–14.0)
NEUT%: 60.7 % (ref 38.4–76.8)
RDW: 22.3 % — ABNORMAL HIGH (ref 11.2–14.5)
lymph#: 4.2 10*3/uL — ABNORMAL HIGH (ref 0.9–3.3)

## 2010-05-29 LAB — MORPHOLOGY: PLT EST: INCREASED

## 2010-06-30 ENCOUNTER — Ambulatory Visit: Payer: Self-pay | Admitting: Oncology

## 2010-07-03 LAB — CBC & DIFF AND RETIC
BASO%: 0.5 % (ref 0.0–2.0)
EOS%: 2.9 % (ref 0.0–7.0)
Eosinophils Absolute: 0.4 10*3/uL (ref 0.0–0.5)
MCH: 32.8 pg (ref 25.1–34.0)
MCV: 98 fL (ref 79.5–101.0)
MONO%: 7.5 % (ref 0.0–14.0)
NEUT#: 9.8 10*3/uL — ABNORMAL HIGH (ref 1.5–6.5)
RBC: 2.44 10*6/uL — ABNORMAL LOW (ref 3.70–5.45)
RDW: 23.1 % — ABNORMAL HIGH (ref 11.2–14.5)
Retic %: 1.87 % — ABNORMAL HIGH (ref 0.50–1.50)
nRBC: 1 % — ABNORMAL HIGH (ref 0–0)

## 2010-07-03 LAB — CHCC SMEAR

## 2010-07-03 LAB — COMPREHENSIVE METABOLIC PANEL
Alkaline Phosphatase: 59 U/L (ref 39–117)
BUN: 17 mg/dL (ref 6–23)
Glucose, Bld: 109 mg/dL — ABNORMAL HIGH (ref 70–99)
Total Bilirubin: 2.5 mg/dL — ABNORMAL HIGH (ref 0.3–1.2)

## 2010-07-03 LAB — URIC ACID: Uric Acid, Serum: 6.8 mg/dL (ref 2.4–7.0)

## 2010-07-03 LAB — MORPHOLOGY

## 2010-07-28 ENCOUNTER — Encounter
Admission: RE | Admit: 2010-07-28 | Discharge: 2010-07-28 | Payer: Self-pay | Source: Home / Self Care | Attending: Family Medicine | Admitting: Family Medicine

## 2010-08-01 ENCOUNTER — Ambulatory Visit (HOSPITAL_BASED_OUTPATIENT_CLINIC_OR_DEPARTMENT_OTHER): Payer: Medicare Other | Admitting: Oncology

## 2010-08-03 LAB — MORPHOLOGY: PLT EST: INCREASED

## 2010-08-03 LAB — CBC WITH DIFFERENTIAL/PLATELET
BASO%: 0.1 % (ref 0.0–2.0)
Basophils Absolute: 0 10*3/uL (ref 0.0–0.1)
EOS%: 3.1 % (ref 0.0–7.0)
Eosinophils Absolute: 0.5 10*3/uL (ref 0.0–0.5)
HCT: 24.7 % — ABNORMAL LOW (ref 34.8–46.6)
HGB: 8.3 g/dL — ABNORMAL LOW (ref 11.6–15.9)
LYMPH%: 17.7 % (ref 14.0–49.7)
MCH: 34.8 pg — ABNORMAL HIGH (ref 25.1–34.0)
MCHC: 33.3 g/dL (ref 31.5–36.0)
MCV: 104.4 fL — ABNORMAL HIGH (ref 79.5–101.0)
MONO#: 0.2 10*3/uL (ref 0.1–0.9)
MONO%: 1.5 % (ref 0.0–14.0)
NEUT#: 11.2 10*3/uL — ABNORMAL HIGH (ref 1.5–6.5)
NEUT%: 77.6 % — ABNORMAL HIGH (ref 38.4–76.8)
Platelets: 505 10*3/uL — ABNORMAL HIGH (ref 145–400)
RBC: 2.37 10*6/uL — ABNORMAL LOW (ref 3.70–5.45)
RDW: 26.4 % — ABNORMAL HIGH (ref 11.2–14.5)
WBC: 14.5 10*3/uL — ABNORMAL HIGH (ref 3.9–10.3)
lymph#: 2.6 10*3/uL (ref 0.9–3.3)

## 2010-08-03 LAB — HOLD TUBE, BLOOD BANK

## 2010-08-06 ENCOUNTER — Encounter: Payer: Self-pay | Admitting: Obstetrics & Gynecology

## 2010-08-09 LAB — JAK-2 V617F

## 2010-08-10 ENCOUNTER — Encounter (HOSPITAL_COMMUNITY)
Admission: RE | Admit: 2010-08-10 | Discharge: 2010-08-15 | Payer: Self-pay | Source: Home / Self Care | Attending: Oncology | Admitting: Oncology

## 2010-08-10 LAB — CBC & DIFF AND RETIC
BASO%: 0.5 % (ref 0.0–2.0)
EOS%: 3.4 % (ref 0.0–7.0)
HCT: 23 % — ABNORMAL LOW (ref 34.8–46.6)
MCH: 32 pg (ref 25.1–34.0)
MCHC: 31.7 g/dL (ref 31.5–36.0)
MONO#: 1.4 10*3/uL — ABNORMAL HIGH (ref 0.1–0.9)
NEUT%: 64 % (ref 38.4–76.8)
RBC: 2.28 10*6/uL — ABNORMAL LOW (ref 3.70–5.45)
RDW: 26.2 % — ABNORMAL HIGH (ref 11.2–14.5)
Retic Ct Abs: 66.58 10*3/uL (ref 18.30–72.70)
WBC: 18.6 10*3/uL — ABNORMAL HIGH (ref 3.9–10.3)
lymph#: 4.6 10*3/uL — ABNORMAL HIGH (ref 0.9–3.3)
nRBC: 1 % — ABNORMAL HIGH (ref 0–0)

## 2010-08-11 LAB — IRON AND TIBC
%SAT: 30 % (ref 20–55)
TIBC: 261 ug/dL (ref 250–470)
UIBC: 184 ug/dL

## 2010-08-11 LAB — ERYTHROPOIETIN: Erythropoietin: 141 m[IU]/mL — ABNORMAL HIGH (ref 2.6–34.0)

## 2010-08-12 LAB — CROSSMATCH: Unit division: 0

## 2010-08-17 ENCOUNTER — Encounter (HOSPITAL_BASED_OUTPATIENT_CLINIC_OR_DEPARTMENT_OTHER): Payer: Medicare Other | Admitting: Oncology

## 2010-08-17 DIAGNOSIS — D47Z9 Other specified neoplasms of uncertain behavior of lymphoid, hematopoietic and related tissue: Secondary | ICD-10-CM

## 2010-08-17 LAB — CBC WITH DIFFERENTIAL/PLATELET
BASO%: 0.6 % (ref 0.0–2.0)
Basophils Absolute: 0.1 10*3/uL (ref 0.0–0.1)
EOS%: 3.6 % (ref 0.0–7.0)
Eosinophils Absolute: 0.5 10*3/uL (ref 0.0–0.5)
HCT: 32.7 % — ABNORMAL LOW (ref 34.8–46.6)
HGB: 10.8 g/dL — ABNORMAL LOW (ref 11.6–15.9)
LYMPH%: 36.4 % (ref 14.0–49.7)
MCH: 30.9 pg (ref 25.1–34.0)
MCHC: 33 g/dL (ref 31.5–36.0)
MCV: 93.7 fL (ref 79.5–101.0)
MONO#: 1.1 10*3/uL — ABNORMAL HIGH (ref 0.1–0.9)
MONO%: 7.7 % (ref 0.0–14.0)
NEUT#: 7.1 10*3/uL — ABNORMAL HIGH (ref 1.5–6.5)
NEUT%: 51.7 % (ref 38.4–76.8)
Platelets: 451 10*3/uL — ABNORMAL HIGH (ref 145–400)
RBC: 3.49 10*6/uL — ABNORMAL LOW (ref 3.70–5.45)
RDW: 26.6 % — ABNORMAL HIGH (ref 11.2–14.5)
WBC: 13.7 10*3/uL — ABNORMAL HIGH (ref 3.9–10.3)
lymph#: 5 10*3/uL — ABNORMAL HIGH (ref 0.9–3.3)
nRBC: 0 % (ref 0–0)

## 2010-08-24 ENCOUNTER — Encounter (HOSPITAL_BASED_OUTPATIENT_CLINIC_OR_DEPARTMENT_OTHER): Payer: Medicare Other | Admitting: Oncology

## 2010-08-24 ENCOUNTER — Other Ambulatory Visit: Payer: Self-pay | Admitting: Oncology

## 2010-08-24 DIAGNOSIS — D47Z9 Other specified neoplasms of uncertain behavior of lymphoid, hematopoietic and related tissue: Secondary | ICD-10-CM

## 2010-08-24 LAB — CBC WITH DIFFERENTIAL/PLATELET
BASO%: 1.3 % (ref 0.0–2.0)
Basophils Absolute: 0.2 10*3/uL — ABNORMAL HIGH (ref 0.0–0.1)
EOS%: 3.1 % (ref 0.0–7.0)
Eosinophils Absolute: 0.4 10*3/uL (ref 0.0–0.5)
HCT: 28.6 % — ABNORMAL LOW (ref 34.8–46.6)
HGB: 9.5 g/dL — ABNORMAL LOW (ref 11.6–15.9)
LYMPH%: 34 % (ref 14.0–49.7)
MCH: 32.2 pg (ref 25.1–34.0)
MCHC: 33.2 g/dL (ref 31.5–36.0)
MCV: 97.1 fL (ref 79.5–101.0)
MONO#: 0.9 10*3/uL (ref 0.1–0.9)
MONO%: 7.2 % (ref 0.0–14.0)
NEUT#: 6.4 10*3/uL (ref 1.5–6.5)
NEUT%: 54.4 % (ref 38.4–76.8)
Platelets: 591 10*3/uL — ABNORMAL HIGH (ref 145–400)
RBC: 2.95 10*6/uL — ABNORMAL LOW (ref 3.70–5.45)
RDW: 29.9 % — ABNORMAL HIGH (ref 11.2–14.5)
WBC: 11.8 10*3/uL — ABNORMAL HIGH (ref 3.9–10.3)
lymph#: 4 10*3/uL — ABNORMAL HIGH (ref 0.9–3.3)

## 2010-08-31 ENCOUNTER — Encounter (HOSPITAL_BASED_OUTPATIENT_CLINIC_OR_DEPARTMENT_OTHER): Payer: Medicare Other | Admitting: Oncology

## 2010-08-31 ENCOUNTER — Other Ambulatory Visit: Payer: Self-pay | Admitting: Oncology

## 2010-08-31 DIAGNOSIS — D47Z9 Other specified neoplasms of uncertain behavior of lymphoid, hematopoietic and related tissue: Secondary | ICD-10-CM

## 2010-08-31 LAB — CBC WITH DIFFERENTIAL/PLATELET
Basophils Absolute: 0.1 10*3/uL (ref 0.0–0.1)
Eosinophils Absolute: 0.4 10*3/uL (ref 0.0–0.5)
HGB: 9.2 g/dL — ABNORMAL LOW (ref 11.6–15.9)
MCV: 96.5 fL (ref 79.5–101.0)
MONO#: 0.9 10*3/uL (ref 0.1–0.9)
MONO%: 7.5 % (ref 0.0–14.0)
NEUT#: 7 10*3/uL — ABNORMAL HIGH (ref 1.5–6.5)
Platelets: 557 10*3/uL — ABNORMAL HIGH (ref 145–400)
RDW: 29.4 % — ABNORMAL HIGH (ref 11.2–14.5)
WBC: 11.9 10*3/uL — ABNORMAL HIGH (ref 3.9–10.3)

## 2010-08-31 LAB — MORPHOLOGY: PLT EST: INCREASED

## 2010-09-07 ENCOUNTER — Encounter (HOSPITAL_BASED_OUTPATIENT_CLINIC_OR_DEPARTMENT_OTHER): Payer: Medicare Other | Admitting: Oncology

## 2010-09-07 ENCOUNTER — Other Ambulatory Visit: Payer: Self-pay | Admitting: Oncology

## 2010-09-07 DIAGNOSIS — D47Z9 Other specified neoplasms of uncertain behavior of lymphoid, hematopoietic and related tissue: Secondary | ICD-10-CM

## 2010-09-07 LAB — CBC WITH DIFFERENTIAL/PLATELET
Basophils Absolute: 0.1 10*3/uL (ref 0.0–0.1)
Eosinophils Absolute: 0.6 10*3/uL — ABNORMAL HIGH (ref 0.0–0.5)
HGB: 9.6 g/dL — ABNORMAL LOW (ref 11.6–15.9)
MONO#: 1.1 10*3/uL — ABNORMAL HIGH (ref 0.1–0.9)
MONO%: 6.8 % (ref 0.0–14.0)
NEUT#: 9.1 10*3/uL — ABNORMAL HIGH (ref 1.5–6.5)
RBC: 2.96 10*6/uL — ABNORMAL LOW (ref 3.70–5.45)
RDW: 29.7 % — ABNORMAL HIGH (ref 11.2–14.5)
WBC: 15.9 10*3/uL — ABNORMAL HIGH (ref 3.9–10.3)
lymph#: 5 10*3/uL — ABNORMAL HIGH (ref 0.9–3.3)

## 2010-09-14 ENCOUNTER — Other Ambulatory Visit: Payer: Self-pay | Admitting: Oncology

## 2010-09-14 ENCOUNTER — Encounter (HOSPITAL_BASED_OUTPATIENT_CLINIC_OR_DEPARTMENT_OTHER): Payer: Medicare Other | Admitting: Oncology

## 2010-09-14 DIAGNOSIS — D696 Thrombocytopenia, unspecified: Secondary | ICD-10-CM

## 2010-09-14 DIAGNOSIS — D47Z9 Other specified neoplasms of uncertain behavior of lymphoid, hematopoietic and related tissue: Secondary | ICD-10-CM

## 2010-09-14 LAB — CBC WITH DIFFERENTIAL/PLATELET
Basophils Absolute: 0.2 10*3/uL — ABNORMAL HIGH (ref 0.0–0.1)
Eosinophils Absolute: 0.6 10*3/uL — ABNORMAL HIGH (ref 0.0–0.5)
HCT: 27.3 % — ABNORMAL LOW (ref 34.8–46.6)
HGB: 9.2 g/dL — ABNORMAL LOW (ref 11.6–15.9)
LYMPH%: 32.6 % (ref 14.0–49.7)
MONO#: 0.4 10*3/uL (ref 0.1–0.9)
NEUT#: 7.6 10*3/uL — ABNORMAL HIGH (ref 1.5–6.5)
Platelets: 611 10*3/uL — ABNORMAL HIGH (ref 145–400)
RBC: 2.81 10*6/uL — ABNORMAL LOW (ref 3.70–5.45)
WBC: 13 10*3/uL — ABNORMAL HIGH (ref 3.9–10.3)

## 2010-09-21 ENCOUNTER — Other Ambulatory Visit: Payer: Self-pay | Admitting: Oncology

## 2010-09-21 ENCOUNTER — Encounter (HOSPITAL_BASED_OUTPATIENT_CLINIC_OR_DEPARTMENT_OTHER): Payer: Medicare Other | Admitting: Oncology

## 2010-09-21 DIAGNOSIS — D47Z9 Other specified neoplasms of uncertain behavior of lymphoid, hematopoietic and related tissue: Secondary | ICD-10-CM

## 2010-09-21 LAB — CBC WITH DIFFERENTIAL/PLATELET
BASO%: 0.6 % (ref 0.0–2.0)
Eosinophils Absolute: 0.6 10*3/uL — ABNORMAL HIGH (ref 0.0–0.5)
HCT: 25.6 % — ABNORMAL LOW (ref 34.8–46.6)
LYMPH%: 35.5 % (ref 14.0–49.7)
MCHC: 33.2 g/dL (ref 31.5–36.0)
MCV: 95.2 fL (ref 79.5–101.0)
MONO#: 1.2 10*3/uL — ABNORMAL HIGH (ref 0.1–0.9)
NEUT%: 51.4 % (ref 38.4–76.8)
Platelets: 612 10*3/uL — ABNORMAL HIGH (ref 145–400)
WBC: 14.1 10*3/uL — ABNORMAL HIGH (ref 3.9–10.3)

## 2010-09-25 LAB — SAMPLE TO BLOOD BANK

## 2010-09-27 LAB — CROSSMATCH
ABO/RH(D): A POS
Unit division: 0

## 2010-09-28 ENCOUNTER — Encounter (HOSPITAL_BASED_OUTPATIENT_CLINIC_OR_DEPARTMENT_OTHER): Payer: Medicare Other | Admitting: Oncology

## 2010-09-28 ENCOUNTER — Other Ambulatory Visit: Payer: Self-pay | Admitting: Oncology

## 2010-09-28 DIAGNOSIS — D47Z9 Other specified neoplasms of uncertain behavior of lymphoid, hematopoietic and related tissue: Secondary | ICD-10-CM

## 2010-09-28 DIAGNOSIS — D649 Anemia, unspecified: Secondary | ICD-10-CM

## 2010-09-28 LAB — CBC WITH DIFFERENTIAL/PLATELET
Eosinophils Absolute: 0.5 10*3/uL (ref 0.0–0.5)
MCV: 94.7 fL (ref 79.5–101.0)
MONO%: 7.7 % (ref 0.0–14.0)
NEUT#: 8.5 10*3/uL — ABNORMAL HIGH (ref 1.5–6.5)
RBC: 2.85 10*6/uL — ABNORMAL LOW (ref 3.70–5.45)
RDW: 27.4 % — ABNORMAL HIGH (ref 11.2–14.5)
WBC: 14.4 10*3/uL — ABNORMAL HIGH (ref 3.9–10.3)
lymph#: 4.2 10*3/uL — ABNORMAL HIGH (ref 0.9–3.3)

## 2010-10-05 ENCOUNTER — Encounter (HOSPITAL_BASED_OUTPATIENT_CLINIC_OR_DEPARTMENT_OTHER): Payer: Medicare Other | Admitting: Oncology

## 2010-10-05 ENCOUNTER — Other Ambulatory Visit: Payer: Self-pay | Admitting: Oncology

## 2010-10-05 DIAGNOSIS — D47Z9 Other specified neoplasms of uncertain behavior of lymphoid, hematopoietic and related tissue: Secondary | ICD-10-CM

## 2010-10-05 LAB — MORPHOLOGY

## 2010-10-05 LAB — CBC WITH DIFFERENTIAL/PLATELET
Eosinophils Absolute: 0.5 10*3/uL (ref 0.0–0.5)
HGB: 8.6 g/dL — ABNORMAL LOW (ref 11.6–15.9)
LYMPH%: 32.8 % (ref 14.0–49.7)
MONO#: 0.6 10*3/uL (ref 0.1–0.9)
NEUT#: 8.8 10*3/uL — ABNORMAL HIGH (ref 1.5–6.5)
Platelets: 611 10*3/uL — ABNORMAL HIGH (ref 145–400)
RBC: 2.6 10*6/uL — ABNORMAL LOW (ref 3.70–5.45)
RDW: 29.5 % — ABNORMAL HIGH (ref 11.2–14.5)
WBC: 15.3 10*3/uL — ABNORMAL HIGH (ref 3.9–10.3)

## 2010-10-12 ENCOUNTER — Encounter (HOSPITAL_BASED_OUTPATIENT_CLINIC_OR_DEPARTMENT_OTHER): Payer: Medicare Other | Admitting: Oncology

## 2010-10-12 ENCOUNTER — Other Ambulatory Visit: Payer: Self-pay | Admitting: Oncology

## 2010-10-12 DIAGNOSIS — D47Z9 Other specified neoplasms of uncertain behavior of lymphoid, hematopoietic and related tissue: Secondary | ICD-10-CM

## 2010-10-12 DIAGNOSIS — D72829 Elevated white blood cell count, unspecified: Secondary | ICD-10-CM

## 2010-10-12 LAB — CBC WITH DIFFERENTIAL/PLATELET
BASO%: 0.7 % (ref 0.0–2.0)
EOS%: 3.1 % (ref 0.0–7.0)
LYMPH%: 27.4 % (ref 14.0–49.7)
MCHC: 32.4 g/dL (ref 31.5–36.0)
MCV: 97 fL (ref 79.5–101.0)
MONO%: 9.1 % (ref 0.0–14.0)
Platelets: 630 10*3/uL — ABNORMAL HIGH (ref 145–400)
RBC: 2.67 10*6/uL — ABNORMAL LOW (ref 3.70–5.45)
nRBC: 0 % (ref 0–0)

## 2010-10-19 ENCOUNTER — Other Ambulatory Visit: Payer: Self-pay | Admitting: Oncology

## 2010-10-19 ENCOUNTER — Encounter (HOSPITAL_BASED_OUTPATIENT_CLINIC_OR_DEPARTMENT_OTHER): Payer: Medicare Other | Admitting: Oncology

## 2010-10-19 DIAGNOSIS — D47Z9 Other specified neoplasms of uncertain behavior of lymphoid, hematopoietic and related tissue: Secondary | ICD-10-CM

## 2010-10-19 DIAGNOSIS — D649 Anemia, unspecified: Secondary | ICD-10-CM

## 2010-10-19 LAB — CBC WITH DIFFERENTIAL/PLATELET
Basophils Absolute: 0 10*3/uL (ref 0.0–0.1)
EOS%: 2.3 % (ref 0.0–7.0)
HGB: 8 g/dL — ABNORMAL LOW (ref 11.6–15.9)
MCH: 33.6 pg (ref 25.1–34.0)
MCV: 101.3 fL — ABNORMAL HIGH (ref 79.5–101.0)
MONO%: 2.4 % (ref 0.0–14.0)
RBC: 2.39 10*6/uL — ABNORMAL LOW (ref 3.70–5.45)
RDW: 29.9 % — ABNORMAL HIGH (ref 11.2–14.5)

## 2010-10-22 LAB — DIFFERENTIAL
Eosinophils Absolute: 0.5 10*3/uL (ref 0.0–0.7)
Lymphs Abs: 4.8 10*3/uL — ABNORMAL HIGH (ref 0.7–4.0)
Monocytes Relative: 8 % (ref 3–12)
Neutro Abs: 7.6 10*3/uL (ref 1.7–7.7)
Neutrophils Relative %: 54 % (ref 43–77)

## 2010-10-22 LAB — POCT CARDIAC MARKERS
CKMB, poc: <1 ng/mL — ABNORMAL LOW (ref 1.0–8.0)
Myoglobin, poc: 33.6 ng/mL (ref 12–200)
Troponin i, poc: <0.05 ng/mL (ref 0.00–0.09)

## 2010-10-22 LAB — POCT I-STAT, CHEM 8
Calcium, Ion: 1.19 mmol/L (ref 1.12–1.32)
Chloride: 104 mEq/L (ref 96–112)
Glucose, Bld: 106 mg/dL — ABNORMAL HIGH (ref 70–99)
HCT: 25 % — ABNORMAL LOW (ref 36.0–46.0)

## 2010-10-22 LAB — CBC
MCV: 107.9 fL — ABNORMAL HIGH (ref 78.0–100.0)
Platelets: 445 10*3/uL — ABNORMAL HIGH (ref 150–400)
RBC: 2.21 MIL/uL — ABNORMAL LOW (ref 3.87–5.11)
WBC: 14 10*3/uL — ABNORMAL HIGH (ref 4.0–10.5)

## 2010-10-22 LAB — GLUCOSE, CAPILLARY

## 2010-10-26 ENCOUNTER — Other Ambulatory Visit: Payer: Self-pay | Admitting: Oncology

## 2010-10-26 ENCOUNTER — Encounter (HOSPITAL_BASED_OUTPATIENT_CLINIC_OR_DEPARTMENT_OTHER): Payer: Medicare Other | Admitting: Oncology

## 2010-10-26 DIAGNOSIS — D649 Anemia, unspecified: Secondary | ICD-10-CM

## 2010-10-26 DIAGNOSIS — D47Z9 Other specified neoplasms of uncertain behavior of lymphoid, hematopoietic and related tissue: Secondary | ICD-10-CM

## 2010-10-26 LAB — URINALYSIS, ROUTINE W REFLEX MICROSCOPIC
Bilirubin Urine: NEGATIVE
Glucose, UA: NEGATIVE mg/dL
Hgb urine dipstick: NEGATIVE
Ketones, ur: NEGATIVE mg/dL
Nitrite: NEGATIVE
Protein, ur: NEGATIVE mg/dL
Specific Gravity, Urine: 1.013 (ref 1.005–1.030)
Urobilinogen, UA: 0.2 mg/dL (ref 0.0–1.0)
pH: 6 (ref 5.0–8.0)

## 2010-10-26 LAB — COMPREHENSIVE METABOLIC PANEL
ALT: 16 U/L (ref 0–35)
AST: 34 U/L (ref 0–37)
Albumin: 4.3 g/dL (ref 3.5–5.2)
Alkaline Phosphatase: 106 U/L (ref 39–117)
BUN: 14 mg/dL (ref 6–23)
CO2: 23 mEq/L (ref 19–32)
Calcium: 9 mg/dL (ref 8.4–10.5)
Chloride: 100 mEq/L (ref 96–112)
Creatinine, Ser: 0.68 mg/dL (ref 0.4–1.2)
GFR calc Af Amer: >60 mL/min (ref >60)
GFR calc non Af Amer: >60 mL/min (ref >60)
Glucose, Bld: 108 mg/dL — ABNORMAL HIGH (ref 70–99)
Potassium: 4.7 mEq/L (ref 3.5–5.1)
Sodium: 131 mEq/L — ABNORMAL LOW (ref 135–145)
Total Bilirubin: 2.2 mg/dL — ABNORMAL HIGH (ref 0.3–1.2)
Total Protein: 6.9 g/dL (ref 6.0–8.3)

## 2010-10-26 LAB — DIFFERENTIAL
Basophils Absolute: 0.3 10*3/uL — ABNORMAL HIGH (ref 0.0–0.1)
Basophils Relative: 1 % (ref 0–1)
Eosinophils Absolute: 0.6 10*3/uL (ref 0.0–0.7)
Eosinophils Relative: 3 % (ref 0–5)
Lymphocytes Relative: 24 % (ref 12–46)
Lymphs Abs: 5.3 10*3/uL — ABNORMAL HIGH (ref 0.7–4.0)
Monocytes Absolute: 1.2 10*3/uL — ABNORMAL HIGH (ref 0.1–1.0)
Monocytes Relative: 5 % (ref 3–12)
Neutro Abs: 14.9 10*3/uL — ABNORMAL HIGH (ref 1.7–7.7)
Neutrophils Relative %: 67 % (ref 43–77)

## 2010-10-26 LAB — CBC WITH DIFFERENTIAL/PLATELET
Eosinophils Absolute: 0.4 10*3/uL (ref 0.0–0.5)
LYMPH%: 30.6 % (ref 14.0–49.7)
MCHC: 32.9 g/dL (ref 31.5–36.0)
MCV: 102.1 fL — ABNORMAL HIGH (ref 79.5–101.0)
MONO%: 6.1 % (ref 0.0–14.0)
Platelets: 487 10*3/uL — ABNORMAL HIGH (ref 145–400)
RBC: 2.43 10*6/uL — ABNORMAL LOW (ref 3.70–5.45)

## 2010-10-26 LAB — BASIC METABOLIC PANEL
CO2: 25 mEq/L (ref 19–32)
Calcium: 8.2 mg/dL — ABNORMAL LOW (ref 8.4–10.5)
Calcium: 8.3 mg/dL — ABNORMAL LOW (ref 8.4–10.5)
Creatinine, Ser: 0.71 mg/dL (ref 0.4–1.2)
GFR calc Af Amer: >60 mL/min (ref >60)
GFR calc Af Amer: >60 mL/min (ref >60)
GFR calc non Af Amer: >60 mL/min (ref >60)
Glucose, Bld: 141 mg/dL — ABNORMAL HIGH (ref 70–99)
Sodium: 136 mEq/L (ref 135–145)

## 2010-10-26 LAB — TYPE AND SCREEN
ABO/RH(D): A POS
Antibody Screen: NEGATIVE

## 2010-10-26 LAB — CBC
HCT: 27.7 % — ABNORMAL LOW (ref 36.0–46.0)
Hemoglobin: 7.1 g/dL — CL (ref 12.0–15.0)
Hemoglobin: 8.8 g/dL — ABNORMAL LOW (ref 12.0–15.0)
Hemoglobin: 9.3 g/dL — ABNORMAL LOW (ref 12.0–15.0)
MCHC: 33.5 g/dL (ref 30.0–36.0)
MCHC: 33.7 g/dL (ref 30.0–36.0)
MCHC: 34 g/dL (ref 30.0–36.0)
MCV: 102 fL — ABNORMAL HIGH (ref 78.0–100.0)
MCV: 106.8 fL — ABNORMAL HIGH (ref 78.0–100.0)
Platelets: 554 10*3/uL — ABNORMAL HIGH (ref 150–400)
RBC: 1.93 MIL/uL — ABNORMAL LOW (ref 3.87–5.11)
RBC: 2.34 MIL/uL — ABNORMAL LOW (ref 3.87–5.11)
RBC: 2.53 MIL/uL — ABNORMAL LOW (ref 3.87–5.11)
RBC: 2.59 MIL/uL — ABNORMAL LOW (ref 3.87–5.11)
RDW: 19 % — ABNORMAL HIGH (ref 11.5–15.5)
RDW: 19 % — ABNORMAL HIGH (ref 11.5–15.5)
WBC: 22.2 10*3/uL — ABNORMAL HIGH (ref 4.0–10.5)

## 2010-10-26 LAB — APTT: aPTT: 25 seconds (ref 24–37)

## 2010-10-26 LAB — PROTIME-INR
INR: 1 (ref 0.00–1.49)
Prothrombin Time: 13.5 seconds (ref 11.6–15.2)

## 2010-10-26 LAB — TISSUE CULTURE

## 2010-10-26 LAB — ABO/RH: ABO/RH(D): A POS

## 2010-10-26 LAB — ANAEROBIC CULTURE

## 2010-11-03 ENCOUNTER — Other Ambulatory Visit: Payer: Self-pay | Admitting: Oncology

## 2010-11-03 ENCOUNTER — Encounter (HOSPITAL_BASED_OUTPATIENT_CLINIC_OR_DEPARTMENT_OTHER): Payer: Medicare Other | Admitting: Oncology

## 2010-11-03 DIAGNOSIS — D47Z9 Other specified neoplasms of uncertain behavior of lymphoid, hematopoietic and related tissue: Secondary | ICD-10-CM

## 2010-11-03 DIAGNOSIS — D649 Anemia, unspecified: Secondary | ICD-10-CM

## 2010-11-03 LAB — CBC & DIFF AND RETIC
BASO%: 0.4 % (ref 0.0–2.0)
EOS%: 3 % (ref 0.0–7.0)
LYMPH%: 29 % (ref 14.0–49.7)
MCH: 32.4 pg (ref 25.1–34.0)
MCHC: 32.1 g/dL (ref 31.5–36.0)
MONO#: 1.1 10*3/uL — ABNORMAL HIGH (ref 0.1–0.9)
MONO%: 6.5 % (ref 0.0–14.0)
Platelets: 597 10*3/uL — ABNORMAL HIGH (ref 145–400)
RBC: 2.47 10*6/uL — ABNORMAL LOW (ref 3.70–5.45)
Retic %: 3.81 % — ABNORMAL HIGH (ref 0.50–1.50)
WBC: 16.7 10*3/uL — ABNORMAL HIGH (ref 3.9–10.3)

## 2010-11-03 LAB — COMPREHENSIVE METABOLIC PANEL
ALT: 15 U/L (ref 0–35)
AST: 15 U/L (ref 0–37)
Alkaline Phosphatase: 59 U/L (ref 39–117)
Creatinine, Ser: 0.66 mg/dL (ref 0.40–1.20)
Sodium: 139 mEq/L (ref 135–145)
Total Bilirubin: 2.3 mg/dL — ABNORMAL HIGH (ref 0.3–1.2)
Total Protein: 6.6 g/dL (ref 6.0–8.3)

## 2010-11-03 LAB — MORPHOLOGY: PLT EST: INCREASED

## 2010-11-09 ENCOUNTER — Other Ambulatory Visit: Payer: Self-pay | Admitting: Oncology

## 2010-11-09 ENCOUNTER — Encounter (HOSPITAL_BASED_OUTPATIENT_CLINIC_OR_DEPARTMENT_OTHER): Payer: Medicare Other | Admitting: Oncology

## 2010-11-09 DIAGNOSIS — D47Z9 Other specified neoplasms of uncertain behavior of lymphoid, hematopoietic and related tissue: Secondary | ICD-10-CM

## 2010-11-09 LAB — CBC & DIFF AND RETIC
BASO%: 0.4 % (ref 0.0–2.0)
Basophils Absolute: 0.1 10*3/uL (ref 0.0–0.1)
EOS%: 3.3 % (ref 0.0–7.0)
HCT: 24.8 % — ABNORMAL LOW (ref 34.8–46.6)
HGB: 8.1 g/dL — ABNORMAL LOW (ref 11.6–15.9)
Immature Retic Fract: 10.9 % — ABNORMAL HIGH (ref 0.00–10.70)
LYMPH%: 29.7 % (ref 14.0–49.7)
MCH: 32.8 pg (ref 25.1–34.0)
MCHC: 32.7 g/dL (ref 31.5–36.0)
NEUT%: 59.3 % (ref 38.4–76.8)
Platelets: 523 10*3/uL — ABNORMAL HIGH (ref 145–400)

## 2010-11-09 LAB — HM PAP SMEAR: HM Pap smear: NORMAL

## 2010-11-10 LAB — COMPREHENSIVE METABOLIC PANEL
ALT: 18 U/L (ref 0–35)
AST: 19 U/L (ref 0–37)
Calcium: 9.3 mg/dL (ref 8.4–10.5)
Chloride: 104 mEq/L (ref 96–112)
Creatinine, Ser: 0.63 mg/dL (ref 0.40–1.20)

## 2010-11-10 LAB — ANTI-NUCLEAR AB-TITER (ANA TITER): ANA Titer 1: 1:40 {titer} — ABNORMAL HIGH

## 2010-11-16 ENCOUNTER — Encounter (HOSPITAL_BASED_OUTPATIENT_CLINIC_OR_DEPARTMENT_OTHER): Payer: Medicare Other | Admitting: Oncology

## 2010-11-16 ENCOUNTER — Other Ambulatory Visit: Payer: Self-pay | Admitting: Oncology

## 2010-11-16 DIAGNOSIS — D649 Anemia, unspecified: Secondary | ICD-10-CM

## 2010-11-16 DIAGNOSIS — D47Z9 Other specified neoplasms of uncertain behavior of lymphoid, hematopoietic and related tissue: Secondary | ICD-10-CM

## 2010-11-16 LAB — CBC WITH DIFFERENTIAL/PLATELET
Basophils Absolute: 0.2 10*3/uL — ABNORMAL HIGH (ref 0.0–0.1)
Eosinophils Absolute: 0.4 10*3/uL (ref 0.0–0.5)
HGB: 7.9 g/dL — ABNORMAL LOW (ref 11.6–15.9)
MONO#: 1 10*3/uL — ABNORMAL HIGH (ref 0.1–0.9)
NEUT#: 8.8 10*3/uL — ABNORMAL HIGH (ref 1.5–6.5)
RDW: 28.3 % — ABNORMAL HIGH (ref 11.2–14.5)
WBC: 14.3 10*3/uL — ABNORMAL HIGH (ref 3.9–10.3)
lymph#: 3.9 10*3/uL — ABNORMAL HIGH (ref 0.9–3.3)

## 2010-11-23 ENCOUNTER — Encounter (HOSPITAL_BASED_OUTPATIENT_CLINIC_OR_DEPARTMENT_OTHER): Payer: Medicare Other | Admitting: Oncology

## 2010-11-23 ENCOUNTER — Other Ambulatory Visit: Payer: Self-pay | Admitting: Oncology

## 2010-11-23 DIAGNOSIS — D47Z9 Other specified neoplasms of uncertain behavior of lymphoid, hematopoietic and related tissue: Secondary | ICD-10-CM

## 2010-11-23 DIAGNOSIS — D649 Anemia, unspecified: Secondary | ICD-10-CM

## 2010-11-23 LAB — CBC WITH DIFFERENTIAL/PLATELET
Basophils Absolute: 0.1 10*3/uL (ref 0.0–0.1)
Eosinophils Absolute: 0.6 10*3/uL — ABNORMAL HIGH (ref 0.0–0.5)
HGB: 7.9 g/dL — ABNORMAL LOW (ref 11.6–15.9)
LYMPH%: 30 % (ref 14.0–49.7)
MCV: 100.4 fL (ref 79.5–101.0)
MONO#: 1.6 10*3/uL — ABNORMAL HIGH (ref 0.1–0.9)
MONO%: 9.9 % (ref 0.0–14.0)
NEUT#: 9.1 10*3/uL — ABNORMAL HIGH (ref 1.5–6.5)
Platelets: 452 10*3/uL — ABNORMAL HIGH (ref 145–400)

## 2010-11-23 LAB — TECHNOLOGIST REVIEW

## 2010-11-28 NOTE — H&P (Signed)
NAMETELLY, BROBERG              ACCOUNT NO.:  [id number]   MEDICAL RECORD NO.:  [id number]          PATIENT TYPE:  INP   LOCATION:  0098                         FACILITY:  [facility]   PHYSICIAN:  Angelia Mould. Derrell Lolling, M.D.DATE OF BIRTH:  February 19, 1941   DATE OF ADMISSION:  10/06/2008  DATE OF DISCHARGE:                              HISTORY & PHYSICAL   CHIEF COMPLAINT:  Abdominal pain and abnormal CT of appendix.   HISTORY:  This is a 70 year old Caucasian female, referred to me by Dr.  Shaune Pollack for possible appendicitis.   The patient states that 45 weeks ago she saw Dr. Kevan Ny because she felt  weird.  Nothing specific was found.  She has not any fever, chills,or  diarrhea or blood in her stools.   One week ago she had some abdominal cramps and gassiness and anorexia,  but her bowel movements were normal and she felt fine after 24 hours.   Starting yesterday, she has had right lower quadrant pain and gassiness  and cramps and bloating sensation, but no nausea, no vomiting.  She has  been able to eat.  She said she had a bowel movement today, but he does  not remember what it looked like.  She denies fever or chills.  She is a  little bit worse today, drove her car.  Had a CT scan of the abdomen and  pelvis today.  This is read as showing acute appendicitis with some  fluid in the area suggesting a possibility of a rupture and the appendix  seems to be stuck onto the sigmoid colon with perhaps a little bit of  secondary inflammation.  There is no free air or obstruction, but the x-  ray is read as showing acute appendicitis.  She is being evaluated and  admitted for possible appendectomy.   PAST HISTORY:  Myelodysplastic syndrome for 2 years followed by Dr.  Cyndie Chime.  Remote history of rheumatic fever.  Remote history of  bilateral tubal ligation.  Remote history of the uterine cryosurgery.  She has had abdominoplasty and breast reduction by Dr. Shon Hough.  She  had a  colonoscopy by Dr. Sherin Quarry in July of 2009, showing only a few  diverticula.   CURRENT MEDICATIONS:  1. Prozac 40 mg a day.  2. Aspirin 81 mg a day.  3. Caltrate.  4. Vitamin D.  5. Multivitamins.  6. Premarin cream.   DRUG ALLERGIES:  NONE KNOWN.   FAMILY HISTORY:  Mother died of breast cancer.  Father died of colon  cancer.  She is only child.   SOCIAL HISTORY:  She lives alone in [place].  She is divorced.  She  has no children.  She quit smoking and quit alcohol intake about 10  years ago.  She is from [place], but lived in [place] for some  time and moved back to [place] 10-15 years ago.   REVIEW OF SYSTEMS:  A 10-system review of systems is performed and is  noncontributory except as described above.   PHYSICAL EXAMINATION:  GENERAL:  Pleasant, stoic Caucasian female in  minimal distress.  VITAL SIGNS:  Temperature 99.1.  Pulse 88, respirations 18, blood  pressure 121/69.  EYES:  Sclerae clear.  Extraocular movements intact.  ENT:  Ears, nose, mouth and throat, lips, tongue and oropharynx are  without gross lesions.  NECK:  Supple, nontender.  No mass.  No jugular distention.  LUNGS: Clear to auscultation.  No chest wall tenderness.  HEART:  Regular rate and rhythm.  No murmur.  No ectopy, no tachycardia.  Radial and femoral pulses are palpable.  BREASTS:  Show well-healed scars of reduction mammoplasty  ABDOMEN:  Slightly obese.  Mostly soft in the upper abdomen without any  tenderness or guarding in the upper abdomen.  She does have some  tenderness in the lower abdomen, more so in the right lower quadrant  than the left lower quadrant.  There has been a mass.  There are scars  from an abdominoplasty.  I do not feel any hernias.  EXTREMITIES:  She moves all 4 extremities well, without pain.  NEUROLOGIC:  No gross motor or sensory deficits.   ADMISSION DATA:  CT scan as described above.  Lab work is pending.   ASSESSMENT:  1. Appendicitis is by  far the most likely diagnosis.  I cannot      completely rule out a Meckel's diverticulitis, Crohn disease or      sigmoid diverticulitis, but I think the chances of those other      diagnoses are certainly 5% or less in probability.  I feel that she      should undergo diagnostic laparoscopy tonight.  2. Myelodysplastic syndrome.  3. Status post abdominoplasty.  4. Status post breast reduction.   PLAN:  1. The patient will be admitted to the hospital, started on IV      antibiotics and IV fluid hydration.  2. She will be taken to operating room for diagnostic laparoscopy,      probable laparoscopic appendectomy, possible open appendectomy.   I have discussed the indication and details with the patient and her  family.  Risks and complications have been outlined, including, but not  limited to bleeding, infection, conversion to open laparotomy, more  extensive surgery  including colon resection if necessary given the diagnosis, wound  problems, cardiac, pulmonary and thromboembolic problems.  She seems to  understand these issues well.  At this time all of her questions are  answered.  She is in full agreement with this plan.      Angelia Mould. Derrell Lolling, M.D.  Electronically Signed     HMI/MEDQ  D:  10/06/2008  T:  10/06/2008  Job:  [id number]   cc:   Duncan Dull, M.D.  Fax: [phone]   Genene Churn. Cyndie Chime, M.D.  Fax: [phone]

## 2010-11-28 NOTE — Discharge Summary (Signed)
Carolyn Brennan, Carolyn Brennan              ACCOUNT NO.:  [id number]   MEDICAL RECORD NO.:  [id number]          PATIENT TYPE:  INP   LOCATION:  1527                         FACILITY[facility]  [facility]   PHYSICIAN:  Angelia Mould. Derrell Lolling, M.D.DATE OF BIRTH:  [birth year]   DATE OF ADMISSION:  10/06/2008  DATE OF DISCHARGE:  10/10/2008                               DISCHARGE SUMMARY   FINAL DIAGNOSIS:  Ruptured appendicitis with abscess.   OPERATION PERFORMED:  Diagnostic laparoscopy, conversion to open  appendectomy and drainage of appendiceal abscess on October 06, 2008.   HISTORY OF PRESENT ILLNESS:  This is a 70 year old female who reported  of some abdominal cramps and gases one week prior to admission which  improved.  She presented with a 24-hour history of right lower quadrant  pain and cramping and bloating but denied nausea or vomiting.  On  examination, she had some tenderness in the lower abdomen, especially in  the right lower quadrant with some guarding.  She had scars from a  abdominoplasty.  No hernias were felt.  Dr. Kevan Ny sent her for a CT scan  which showed acute appendicitis with some fluid in the area suggesting  the possibility of rupture and possibly that the appendix was stuck onto  the sigmoid colon.  She was sent to [facility] emergency room for my  evaluation and admitted for further management.   It is notable that she has a myelodysplastic syndrome and has been  followed for 2 years by Dr. Cephas Darby and this has been stable.   HOSPITAL COURSE:  On the day of admission, the patient was hydrated with  IV fluids, given broad-spectrum antibiotics and taken to the operating  room.  Diagnostic laparoscopy was performed, and she had lots of  adhesions from her abdominoplasty.  We were able to take those down, but  we found that the terminal ileum, appendix, cecum and sigmoid colon were  densely adherent to each other and it was not safe to dissect these  laparoscopically.  We  converted to an open operation and found that she  had advanced appendicitis which looked like it had been present for  several days.  There was a clear-cut abscess in the area which was  drained.  Ultimately, I was able to perform an appendectomy with good  closure of the appendiceal stump.  There did not appear to be prior  primary disease of the terminal ileum, cecum or sigmoid colon.   Postoperatively, she was maintained on broad-spectrum antibiotics.  She  had a little bit of bleeding from her wound which was self-limited and  resolved quickly.  On March 26, it was noted that her hemoglobin was 7.1  which was lower than her baseline of around 9.  She was doing well at  this time.  This was a multifactorial anemia, due to both acute blood  loss as well as her chronic anemia for myelodysplastic syndrome.  She  was transfused 2 units of packed  red blood cells.  Hemoglobin came up to 8.8 and she did well.  She  progressed in her diet and activities  fairly well thereafter, and was  discharged home on March 28.  At that time, she was afebrile.  Abdomen  was soft, wound looked good.  She tolerated diet.  She was asked to  return see me in the office in about one week.      Angelia Mould. Derrell Lolling, M.D.  Electronically Signed     HMI/MEDQ  D:  10/22/2008  T:  10/22/2008  Job:  [id number]   cc:   Duncan Dull, M.D.  Fax: [phone]

## 2010-11-28 NOTE — Op Note (Signed)
NAMELEROY, TRIM              ACCOUNT NO.:  [id number]   MEDICAL RECORD NO.:  [id number]          PATIENT TYPE:  INP   LOCATION:  0098                         FACILITY:  [facility]   PHYSICIAN:  Angelia Mould. Derrell Lolling, M.D.DATE OF BIRTH:  01/12/1941   DATE OF PROCEDURE:  10/06/2008  DATE OF DISCHARGE:                               OPERATIVE REPORT   PREOPERATIVE DIAGNOSIS:  Acute appendicitis, possibly ruptured.   POSTOPERATIVE DIAGNOSIS:  Ruptured appendicitis with abscess and  peritonitis.   OPERATION PERFORMED:  Diagnostic laparoscopy, open appendectomy.   SURGEON:  Dr. Claud Kelp.   OPERATIVE INDICATIONS:  This is a 70 year old Caucasian female who has  had some anorexia and abdominal cramping for about a week.  She really  said it was not bothering her very much except for the last 24 hours and  then now it bothers her more with more pain in the right lower quadrant.  She denies nausea, vomiting, fever or chills or change her bowel habits.  She was seen by Dr. Shaune Pollack as an outpatient.  A CT scan was  obtained which showed acute appendicitis and specifically showed a very  thickened appendix with some fluid around it with the appendix extending  across and touching the sigmoid colon that could not exclude rupture or  abscess, although no well defined abscess was seen.  There was no bowel  obstruction.   OPERATIVE FINDINGS:  The patient had advanced appendicitis with walled  off abscess and localized peritonitis in the lower abdomen.  There were  extensive adhesions in the lower midline from her previous  abdominoplasty.  The appendix was very densely adherent to the terminal  ileum as well as the sigmoid colon but there was no primary disease  process of the sigmoid colon or terminal ileum that I could see.  The  appendix had obviously ruptured and there was an abscess with foul  smelling pus present which was cultured and evacuated.   OPERATIVE TECHNIQUE:  Following  induction of general endotracheal  anesthesia, a Foley catheter was placed.  The abdomen was prepped and  draped in a sterile fashion.  Intravenous antibiotics had been  previously given in the emergency room.  The patient was identified as  correct patient, correct procedure.   I focused my attention on the umbilicus.  I made a curved incision in  the superior rim of the umbilicus, through the previous abdominoplasty  scar.  The fascia above the appendix was incised in the midline.  We  entered the peritoneal cavity.  The 11 mm Hassan trocar was inserted and  secured with the simple sutures of 0 Vicryl.  Pneumoperitoneum was  created.  5 mm trocar was placed in the right upper quadrant and a 5-mm  trocar placed the left midabdomen.  We spent some time trying to  identify the anatomy.  We took down some omental adhesions from the  midline.  These got more extensive as we got toward the symphysis pubis.  We went down toward the right lower quadrant and we found that the  sigmoid colon and terminal ileum and cecum  were very densely adherent to  each other and rock hard and could not be dissected laparoscopically.  At this point we abandoned the laparoscopic approach.  All the trocars  were removed.  The fascia above the umbilicus was closed with 3  interrupted sutures of #1 Novofil.   A lower midline incision was made.  Dissection was carried down through  subcutaneous tissue.  The fascia was incised in the midline.  The  abdominal cavity was entered and explored with findings as described  above.  We had some more omental adhesions that we took down from the  undersurface of the abdominal wall, but ultimately these were completely  taken down with good hemostasis.   We then explored the right lower quadrant and pelvis.  We slowly,  bluntly dissected the terminal ileum and the appendix and the cecum away  from the sigmoid colon.  In doing so I entered the abscess cavity which  was  cultured and evacuated.  At this point I could see the appendix was  obviously inflamed with a large ruptured area but the base of the  appendix where it inserted in the cecum looked fairly normal.  The  sigmoid colon showed only mild signs of secondary inflammation as did  the terminal ileum.  We divided the appendiceal mesentery with Harmonic  scalpel.  We transected the base of the appendix at the level of the  cecum with a GIA stapler and removed the specimen.  We imbricated the  staple line with 3 interrupted imbricating sutures of 3-0 Vicryl.  This  provided a very nice closure to the appendiceal stump.  Hemostasis was  excellent and achieved with electrocautery and harmonic scalpel.  We  irrigated it out thoroughly.  The upper abdomen really was not involved  in the inflammatory process.  After thoroughly irrigating everything, we  were satisfied that we had evacuated all of the infected fluid and that  we had thoroughly irrigated out the lower abdomen.  We did not feel that  drains were necessary because the abscess cavity was not that well  formed.  We returned the intestine and omentum back to their anatomic  positions.  The midline fascia was closed with running suture of double-  stranded #1 PDS and 4 or 5 interrupted sutures of #1 Novofil.  The skin  incision was irrigated with saline and the skin closed loosely with skin  staples and Telfa wicks were placed in between the staples to promote  drainage.  Clean bandages were placed and the patient taken recovery in  stable condition.  Estimated blood loss was about 50 mL.  Complications  were none.  Sponge, needle and instrument counts were correct.      Angelia Mould. Derrell Lolling, M.D.  Electronically Signed     HMI/MEDQ  D:  10/06/2008  T:  10/07/2008  Job:  [id number]   cc:   Duncan Dull, M.D.  Fax: [phone]   Genene Churn. Cyndie Chime, M.D.  Fax: [phone]

## 2010-11-30 ENCOUNTER — Other Ambulatory Visit: Payer: Self-pay | Admitting: Oncology

## 2010-11-30 ENCOUNTER — Encounter (HOSPITAL_BASED_OUTPATIENT_CLINIC_OR_DEPARTMENT_OTHER): Payer: Medicare Other | Admitting: Oncology

## 2010-11-30 DIAGNOSIS — D47Z9 Other specified neoplasms of uncertain behavior of lymphoid, hematopoietic and related tissue: Secondary | ICD-10-CM

## 2010-11-30 LAB — CBC WITH DIFFERENTIAL/PLATELET
Eosinophils Absolute: 0.4 10*3/uL (ref 0.0–0.5)
MCV: 106 fL — ABNORMAL HIGH (ref 79.5–101.0)
MONO#: 0.8 10*3/uL (ref 0.1–0.9)
MONO%: 7.3 % (ref 0.0–14.0)
NEUT#: 6.4 10*3/uL (ref 1.5–6.5)
RBC: 2.27 10*6/uL — ABNORMAL LOW (ref 3.70–5.45)
RDW: 26.2 % — ABNORMAL HIGH (ref 11.2–14.5)
WBC: 11 10*3/uL — ABNORMAL HIGH (ref 3.9–10.3)
lymph#: 3.3 10*3/uL (ref 0.9–3.3)

## 2010-12-07 ENCOUNTER — Encounter (HOSPITAL_BASED_OUTPATIENT_CLINIC_OR_DEPARTMENT_OTHER): Payer: Medicare Other | Admitting: Oncology

## 2010-12-07 ENCOUNTER — Other Ambulatory Visit: Payer: Self-pay | Admitting: Oncology

## 2010-12-07 DIAGNOSIS — D72829 Elevated white blood cell count, unspecified: Secondary | ICD-10-CM

## 2010-12-07 DIAGNOSIS — D47Z9 Other specified neoplasms of uncertain behavior of lymphoid, hematopoietic and related tissue: Secondary | ICD-10-CM

## 2010-12-07 LAB — CBC WITH DIFFERENTIAL/PLATELET
Eosinophils Absolute: 0.5 10*3/uL (ref 0.0–0.5)
HGB: 8.2 g/dL — ABNORMAL LOW (ref 11.6–15.9)
LYMPH%: 23 % (ref 14.0–49.7)
MONO#: 0.4 10*3/uL (ref 0.1–0.9)
NEUT#: 11.9 10*3/uL — ABNORMAL HIGH (ref 1.5–6.5)
Platelets: 563 10*3/uL — ABNORMAL HIGH (ref 145–400)
RBC: 2.32 10*6/uL — ABNORMAL LOW (ref 3.70–5.45)
WBC: 16.8 10*3/uL — ABNORMAL HIGH (ref 3.9–10.3)

## 2010-12-14 ENCOUNTER — Other Ambulatory Visit: Payer: Self-pay | Admitting: Oncology

## 2010-12-14 ENCOUNTER — Encounter (HOSPITAL_BASED_OUTPATIENT_CLINIC_OR_DEPARTMENT_OTHER): Payer: Medicare Other | Admitting: Oncology

## 2010-12-14 DIAGNOSIS — D47Z9 Other specified neoplasms of uncertain behavior of lymphoid, hematopoietic and related tissue: Secondary | ICD-10-CM

## 2010-12-14 DIAGNOSIS — D696 Thrombocytopenia, unspecified: Secondary | ICD-10-CM

## 2010-12-14 LAB — CBC WITH DIFFERENTIAL/PLATELET
Eosinophils Absolute: 0.4 10*3/uL (ref 0.0–0.5)
HCT: 24.7 % — ABNORMAL LOW (ref 34.8–46.6)
LYMPH%: 31.8 % (ref 14.0–49.7)
MCHC: 32.4 g/dL (ref 31.5–36.0)
MONO#: 1.4 10*3/uL — ABNORMAL HIGH (ref 0.1–0.9)
NEUT#: 8.2 10*3/uL — ABNORMAL HIGH (ref 1.5–6.5)
NEUT%: 55.6 % (ref 38.4–76.8)
Platelets: 510 10*3/uL — ABNORMAL HIGH (ref 145–400)
WBC: 14.8 10*3/uL — ABNORMAL HIGH (ref 3.9–10.3)

## 2010-12-21 ENCOUNTER — Encounter (HOSPITAL_BASED_OUTPATIENT_CLINIC_OR_DEPARTMENT_OTHER): Payer: Medicare Other | Admitting: Oncology

## 2010-12-21 ENCOUNTER — Other Ambulatory Visit: Payer: Self-pay | Admitting: Oncology

## 2010-12-21 DIAGNOSIS — D47Z9 Other specified neoplasms of uncertain behavior of lymphoid, hematopoietic and related tissue: Secondary | ICD-10-CM

## 2010-12-21 LAB — CBC WITH DIFFERENTIAL/PLATELET
Basophils Absolute: 0.1 10*3/uL (ref 0.0–0.1)
EOS%: 3.3 % (ref 0.0–7.0)
HCT: 23.7 % — ABNORMAL LOW (ref 34.8–46.6)
HGB: 7.9 g/dL — ABNORMAL LOW (ref 11.6–15.9)
MCH: 35.7 pg — ABNORMAL HIGH (ref 25.1–34.0)
MCV: 106.9 fL — ABNORMAL HIGH (ref 79.5–101.0)
MONO%: 5.4 % (ref 0.0–14.0)
NEUT%: 58.5 % (ref 38.4–76.8)
Platelets: 495 10*3/uL — ABNORMAL HIGH (ref 145–400)

## 2010-12-28 ENCOUNTER — Other Ambulatory Visit: Payer: Self-pay | Admitting: Oncology

## 2010-12-28 ENCOUNTER — Encounter (HOSPITAL_BASED_OUTPATIENT_CLINIC_OR_DEPARTMENT_OTHER): Payer: Medicare Other | Admitting: Oncology

## 2010-12-28 DIAGNOSIS — D649 Anemia, unspecified: Secondary | ICD-10-CM

## 2010-12-28 DIAGNOSIS — D47Z9 Other specified neoplasms of uncertain behavior of lymphoid, hematopoietic and related tissue: Secondary | ICD-10-CM

## 2010-12-28 LAB — CBC WITH DIFFERENTIAL/PLATELET
Eosinophils Absolute: 0.5 10*3/uL (ref 0.0–0.5)
HCT: 23.8 % — ABNORMAL LOW (ref 34.8–46.6)
LYMPH%: 28.9 % (ref 14.0–49.7)
MONO#: 0.6 10*3/uL (ref 0.1–0.9)
NEUT#: 7.6 10*3/uL — ABNORMAL HIGH (ref 1.5–6.5)
NEUT%: 59.3 % (ref 38.4–76.8)
Platelets: 527 10*3/uL — ABNORMAL HIGH (ref 145–400)
RBC: 2.24 10*6/uL — ABNORMAL LOW (ref 3.70–5.45)
WBC: 12.9 10*3/uL — ABNORMAL HIGH (ref 3.9–10.3)
lymph#: 3.7 10*3/uL — ABNORMAL HIGH (ref 0.9–3.3)

## 2011-01-25 ENCOUNTER — Other Ambulatory Visit: Payer: Self-pay | Admitting: Oncology

## 2011-01-25 ENCOUNTER — Ambulatory Visit (HOSPITAL_COMMUNITY): Payer: Medicare Other | Attending: Oncology

## 2011-01-25 DIAGNOSIS — D469 Myelodysplastic syndrome, unspecified: Secondary | ICD-10-CM

## 2011-01-25 DIAGNOSIS — D4620 Refractory anemia with excess of blasts, unspecified: Secondary | ICD-10-CM | POA: Insufficient documentation

## 2011-01-25 LAB — DIFFERENTIAL
Basophils Absolute: 0.1 10*3/uL (ref 0.0–0.1)
Basophils Relative: 1 % (ref 0–1)
Lymphs Abs: 3.6 10*3/uL (ref 0.7–4.0)
Monocytes Relative: 8 % (ref 3–12)
Neutro Abs: 6.5 10*3/uL (ref 1.7–7.7)

## 2011-01-25 LAB — CBC
HCT: 22.4 % — ABNORMAL LOW (ref 36.0–46.0)
Hemoglobin: 7.4 g/dL — ABNORMAL LOW (ref 12.0–15.0)
MCV: 102.8 fL — ABNORMAL HIGH (ref 78.0–100.0)
RDW: 27.2 % — ABNORMAL HIGH (ref 11.5–15.5)
WBC: 11.6 10*3/uL — ABNORMAL HIGH (ref 4.0–10.5)

## 2011-01-29 NOTE — Op Note (Signed)
  [name], Carolyn Brennan              ACCOUNT NO.:  [id number]  MEDICAL RECORD NO.:  [id number]  LOCATION:  MDC                          FACILITY:  [facility]  PHYSICIAN:  Levert Feinstein, M.D., F.A.C.P.DATE OF BIRTH: [birth year]  DATE OF PROCEDURE:  01/25/2011 DATE OF DISCHARGE:                              OPERATIVE REPORT   PROCEDURE NOTE:  Right posterior iliac crest bone marrow aspiration and biopsy done under local 2% lidocaine anesthesia with 5 mg IV Versed premedication without complication.  ASA class I, airway status II.  INDICATIONS:  Reevaluation of refractory anemia/myelodysplastic syndrome.     Levert Feinstein, M.D., F.A.C.P.     JMG/MEDQ  D:  01/25/2011  T:  01/25/2011  Job:  [id number]  Electronically Signed by Cephas Darby M.D. on 01/29/2011 02:08:33 PM

## 2011-02-02 ENCOUNTER — Encounter (HOSPITAL_BASED_OUTPATIENT_CLINIC_OR_DEPARTMENT_OTHER): Payer: Medicare Other | Admitting: Oncology

## 2011-02-02 DIAGNOSIS — D4620 Refractory anemia with excess of blasts, unspecified: Secondary | ICD-10-CM

## 2011-02-15 ENCOUNTER — Encounter (HOSPITAL_BASED_OUTPATIENT_CLINIC_OR_DEPARTMENT_OTHER): Payer: Medicare Other | Admitting: Oncology

## 2011-02-15 ENCOUNTER — Other Ambulatory Visit: Payer: Self-pay | Admitting: Oncology

## 2011-02-15 DIAGNOSIS — D649 Anemia, unspecified: Secondary | ICD-10-CM

## 2011-02-15 DIAGNOSIS — D47Z9 Other specified neoplasms of uncertain behavior of lymphoid, hematopoietic and related tissue: Secondary | ICD-10-CM

## 2011-02-15 LAB — COMPREHENSIVE METABOLIC PANEL
ALT: 21 U/L (ref 0–35)
AST: 21 U/L (ref 0–37)
Albumin: 4.7 g/dL (ref 3.5–5.2)
Alkaline Phosphatase: 57 U/L (ref 39–117)
Glucose, Bld: 112 mg/dL — ABNORMAL HIGH (ref 70–99)
Potassium: 4.7 mEq/L (ref 3.5–5.3)
Sodium: 138 mEq/L (ref 135–145)
Total Protein: 6.6 g/dL (ref 6.0–8.3)

## 2011-02-15 LAB — CBC WITH DIFFERENTIAL/PLATELET
BASO%: 1.2 % (ref 0.0–2.0)
EOS%: 3.2 % (ref 0.0–7.0)
Eosinophils Absolute: 0.4 10*3/uL (ref 0.0–0.5)
MCV: 106.5 fL — ABNORMAL HIGH (ref 79.5–101.0)
MONO%: 5.1 % (ref 0.0–14.0)
NEUT#: 8.5 10*3/uL — ABNORMAL HIGH (ref 1.5–6.5)
RBC: 2.28 10*6/uL — ABNORMAL LOW (ref 3.70–5.45)
RDW: 26 % — ABNORMAL HIGH (ref 11.2–14.5)
WBC: 13.7 10*3/uL — ABNORMAL HIGH (ref 3.9–10.3)

## 2011-02-27 ENCOUNTER — Other Ambulatory Visit: Payer: Self-pay | Admitting: Oncology

## 2011-02-27 ENCOUNTER — Encounter (HOSPITAL_BASED_OUTPATIENT_CLINIC_OR_DEPARTMENT_OTHER): Payer: Medicare Other | Admitting: Oncology

## 2011-02-27 DIAGNOSIS — D649 Anemia, unspecified: Secondary | ICD-10-CM

## 2011-02-27 DIAGNOSIS — D47Z9 Other specified neoplasms of uncertain behavior of lymphoid, hematopoietic and related tissue: Secondary | ICD-10-CM

## 2011-02-27 LAB — COMPREHENSIVE METABOLIC PANEL
AST: 15 U/L (ref 0–37)
Albumin: 4.5 g/dL (ref 3.5–5.2)
Alkaline Phosphatase: 53 U/L (ref 39–117)
BUN: 14 mg/dL (ref 6–23)
Potassium: 4.3 mEq/L (ref 3.5–5.3)
Sodium: 134 mEq/L — ABNORMAL LOW (ref 135–145)
Total Protein: 6.5 g/dL (ref 6.0–8.3)

## 2011-02-27 LAB — CBC WITH DIFFERENTIAL/PLATELET
EOS%: 3 % (ref 0.0–7.0)
MCH: 36.4 pg — ABNORMAL HIGH (ref 25.1–34.0)
MCHC: 34.3 g/dL (ref 31.5–36.0)
MCV: 106.2 fL — ABNORMAL HIGH (ref 79.5–101.0)
MONO%: 3.6 % (ref 0.0–14.0)
RBC: 2.18 10*6/uL — ABNORMAL LOW (ref 3.70–5.45)
RDW: 27.3 % — ABNORMAL HIGH (ref 11.2–14.5)

## 2011-03-15 ENCOUNTER — Other Ambulatory Visit: Payer: Self-pay | Admitting: Oncology

## 2011-03-15 ENCOUNTER — Encounter (HOSPITAL_BASED_OUTPATIENT_CLINIC_OR_DEPARTMENT_OTHER): Payer: Medicare Other | Admitting: Oncology

## 2011-03-15 DIAGNOSIS — D649 Anemia, unspecified: Secondary | ICD-10-CM

## 2011-03-15 DIAGNOSIS — D47Z9 Other specified neoplasms of uncertain behavior of lymphoid, hematopoietic and related tissue: Secondary | ICD-10-CM

## 2011-03-15 LAB — CBC WITH DIFFERENTIAL/PLATELET
Basophils Absolute: 0 10*3/uL (ref 0.0–0.1)
EOS%: 3.6 % (ref 0.0–7.0)
HCT: 23.8 % — ABNORMAL LOW (ref 34.8–46.6)
HGB: 8 g/dL — ABNORMAL LOW (ref 11.6–15.9)
LYMPH%: 16.1 % (ref 14.0–49.7)
MCH: 35.5 pg — ABNORMAL HIGH (ref 25.1–34.0)
MCV: 105.9 fL — ABNORMAL HIGH (ref 79.5–101.0)
NEUT%: 79.2 % — ABNORMAL HIGH (ref 38.4–76.8)
Platelets: 600 10*3/uL — ABNORMAL HIGH (ref 145–400)
lymph#: 2.3 10*3/uL (ref 0.9–3.3)

## 2011-03-15 LAB — COMPREHENSIVE METABOLIC PANEL
Albumin: 4.9 g/dL (ref 3.5–5.2)
BUN: 20 mg/dL (ref 6–23)
CO2: 21 mEq/L (ref 19–32)
Glucose, Bld: 92 mg/dL (ref 70–99)
Potassium: 4.5 mEq/L (ref 3.5–5.3)
Sodium: 138 mEq/L (ref 135–145)
Total Bilirubin: 2.9 mg/dL — ABNORMAL HIGH (ref 0.3–1.2)
Total Protein: 6.7 g/dL (ref 6.0–8.3)

## 2011-03-29 ENCOUNTER — Other Ambulatory Visit: Payer: Self-pay | Admitting: Oncology

## 2011-03-29 ENCOUNTER — Encounter (HOSPITAL_BASED_OUTPATIENT_CLINIC_OR_DEPARTMENT_OTHER): Payer: Medicare Other | Admitting: Oncology

## 2011-03-29 DIAGNOSIS — D649 Anemia, unspecified: Secondary | ICD-10-CM

## 2011-03-29 DIAGNOSIS — D47Z9 Other specified neoplasms of uncertain behavior of lymphoid, hematopoietic and related tissue: Secondary | ICD-10-CM

## 2011-03-29 LAB — CBC WITH DIFFERENTIAL/PLATELET
Eosinophils Absolute: 0.5 10*3/uL (ref 0.0–0.5)
MONO#: 0.3 10*3/uL (ref 0.1–0.9)
NEUT#: 7.2 10*3/uL — ABNORMAL HIGH (ref 1.5–6.5)
RBC: 2.22 10*6/uL — ABNORMAL LOW (ref 3.70–5.45)
RDW: 27.5 % — ABNORMAL HIGH (ref 11.2–14.5)
WBC: 12.8 10*3/uL — ABNORMAL HIGH (ref 3.9–10.3)

## 2011-03-29 LAB — COMPREHENSIVE METABOLIC PANEL
Albumin: 4.8 g/dL (ref 3.5–5.2)
CO2: 26 mEq/L (ref 19–32)
Chloride: 106 mEq/L (ref 96–112)
Glucose, Bld: 100 mg/dL — ABNORMAL HIGH (ref 70–99)
Potassium: 4.5 mEq/L (ref 3.5–5.3)
Sodium: 140 mEq/L (ref 135–145)
Total Protein: 6.6 g/dL (ref 6.0–8.3)

## 2011-04-06 ENCOUNTER — Encounter (HOSPITAL_BASED_OUTPATIENT_CLINIC_OR_DEPARTMENT_OTHER): Payer: Medicare Other | Admitting: Oncology

## 2011-04-06 DIAGNOSIS — D47Z9 Other specified neoplasms of uncertain behavior of lymphoid, hematopoietic and related tissue: Secondary | ICD-10-CM

## 2011-04-06 DIAGNOSIS — D4620 Refractory anemia with excess of blasts, unspecified: Secondary | ICD-10-CM

## 2011-04-09 LAB — D-DIMER, QUANTITATIVE: D-Dimer, Quant: 0.37

## 2011-05-09 ENCOUNTER — Encounter (HOSPITAL_COMMUNITY)
Admission: RE | Admit: 2011-05-09 | Discharge: 2011-05-09 | Disposition: A | Discharge status: Home / Self Care | Payer: Medicare Other | Source: Ambulatory Visit | Attending: Oncology | Admitting: Oncology

## 2011-05-09 ENCOUNTER — Encounter (HOSPITAL_BASED_OUTPATIENT_CLINIC_OR_DEPARTMENT_OTHER): Payer: Medicare Other | Admitting: Oncology

## 2011-05-09 ENCOUNTER — Other Ambulatory Visit: Payer: Self-pay | Admitting: Oncology

## 2011-05-09 DIAGNOSIS — D4620 Refractory anemia with excess of blasts, unspecified: Secondary | ICD-10-CM

## 2011-05-09 DIAGNOSIS — D649 Anemia, unspecified: Secondary | ICD-10-CM | POA: Insufficient documentation

## 2011-05-09 DIAGNOSIS — D47Z9 Other specified neoplasms of uncertain behavior of lymphoid, hematopoietic and related tissue: Secondary | ICD-10-CM

## 2011-05-09 DIAGNOSIS — D696 Thrombocytopenia, unspecified: Secondary | ICD-10-CM

## 2011-05-09 LAB — CBC WITH DIFFERENTIAL/PLATELET
Basophils Absolute: 0 10*3/uL (ref 0.0–0.1)
EOS%: 3.5 % (ref 0.0–7.0)
Eosinophils Absolute: 0.5 10*3/uL (ref 0.0–0.5)
HCT: 22.1 % — ABNORMAL LOW (ref 34.8–46.6)
HGB: 7.4 g/dL — ABNORMAL LOW (ref 11.6–15.9)
MCH: 34.9 pg — ABNORMAL HIGH (ref 25.1–34.0)
MCV: 104.3 fL — ABNORMAL HIGH (ref 79.5–101.0)
NEUT#: 8.1 10*3/uL — ABNORMAL HIGH (ref 1.5–6.5)
NEUT%: 58.2 % (ref 38.4–76.8)
RDW: 28.6 % — ABNORMAL HIGH (ref 11.2–14.5)
lymph#: 4.8 10*3/uL — ABNORMAL HIGH (ref 0.9–3.3)

## 2011-05-10 LAB — TYPE & CROSSMATCH - CHCC

## 2011-05-11 ENCOUNTER — Encounter (HOSPITAL_BASED_OUTPATIENT_CLINIC_OR_DEPARTMENT_OTHER): Payer: Medicare Other | Admitting: Oncology

## 2011-05-11 DIAGNOSIS — R5381 Other malaise: Secondary | ICD-10-CM

## 2011-05-11 DIAGNOSIS — R0602 Shortness of breath: Secondary | ICD-10-CM

## 2011-05-11 DIAGNOSIS — D47Z9 Other specified neoplasms of uncertain behavior of lymphoid, hematopoietic and related tissue: Secondary | ICD-10-CM

## 2011-05-12 LAB — CROSSMATCH
ABO/RH(D): A POS
Antibody Screen: NEGATIVE
Unit division: 0
Unit division: 0

## 2011-06-11 ENCOUNTER — Other Ambulatory Visit: Payer: Self-pay | Admitting: Oncology

## 2011-06-11 ENCOUNTER — Other Ambulatory Visit (HOSPITAL_BASED_OUTPATIENT_CLINIC_OR_DEPARTMENT_OTHER): Payer: Medicare Other | Admitting: Lab

## 2011-06-11 DIAGNOSIS — D47Z9 Other specified neoplasms of uncertain behavior of lymphoid, hematopoietic and related tissue: Secondary | ICD-10-CM

## 2011-06-11 DIAGNOSIS — D4620 Refractory anemia with excess of blasts, unspecified: Secondary | ICD-10-CM

## 2011-06-11 DIAGNOSIS — D649 Anemia, unspecified: Secondary | ICD-10-CM

## 2011-06-11 LAB — CBC WITH DIFFERENTIAL/PLATELET
BASO%: 1.3 % (ref 0.0–2.0)
EOS%: 3.9 % (ref 0.0–7.0)
MCH: 32.7 pg (ref 25.1–34.0)
MCHC: 33.1 g/dL (ref 31.5–36.0)
MONO#: 0.9 10*3/uL (ref 0.1–0.9)
NEUT%: 62.1 % (ref 38.4–76.8)
RBC: 2.42 10*6/uL — ABNORMAL LOW (ref 3.70–5.45)
RDW: 28.1 % — ABNORMAL HIGH (ref 11.2–14.5)
WBC: 15.7 10*3/uL — ABNORMAL HIGH (ref 3.9–10.3)
lymph#: 4.3 10*3/uL — ABNORMAL HIGH (ref 0.9–3.3)

## 2011-06-13 ENCOUNTER — Telehealth: Payer: Self-pay | Admitting: *Deleted

## 2011-06-13 NOTE — Telephone Encounter (Signed)
Pt. Notified that HGB at her baseline & she reports that she is very happy.

## 2011-06-29 ENCOUNTER — Other Ambulatory Visit: Payer: Self-pay

## 2011-06-29 ENCOUNTER — Other Ambulatory Visit (HOSPITAL_BASED_OUTPATIENT_CLINIC_OR_DEPARTMENT_OTHER): Payer: Medicare Other | Admitting: Lab

## 2011-06-29 DIAGNOSIS — D649 Anemia, unspecified: Secondary | ICD-10-CM

## 2011-06-29 DIAGNOSIS — D471 Chronic myeloproliferative disease: Secondary | ICD-10-CM

## 2011-06-29 DIAGNOSIS — D47Z9 Other specified neoplasms of uncertain behavior of lymphoid, hematopoietic and related tissue: Secondary | ICD-10-CM

## 2011-06-29 LAB — MORPHOLOGY

## 2011-06-29 LAB — COMPREHENSIVE METABOLIC PANEL
ALT: 12 U/L (ref 0–35)
Albumin: 4.7 g/dL (ref 3.5–5.2)
CO2: 24 mEq/L (ref 19–32)
Calcium: 9.6 mg/dL (ref 8.4–10.5)
Chloride: 105 mEq/L (ref 96–112)
Glucose, Bld: 94 mg/dL (ref 70–99)
Potassium: 4.3 mEq/L (ref 3.5–5.3)
Sodium: 138 mEq/L (ref 135–145)
Total Bilirubin: 2.5 mg/dL — ABNORMAL HIGH (ref 0.3–1.2)
Total Protein: 6.1 g/dL (ref 6.0–8.3)

## 2011-06-29 LAB — CBC WITH DIFFERENTIAL/PLATELET
Eosinophils Absolute: 0.5 10*3/uL (ref 0.0–0.5)
HCT: 22.4 % — ABNORMAL LOW (ref 34.8–46.6)
LYMPH%: 31.4 % (ref 14.0–49.7)
MONO#: 0.5 10*3/uL (ref 0.1–0.9)
NEUT#: 7.8 10*3/uL — ABNORMAL HIGH (ref 1.5–6.5)
Platelets: 538 10*3/uL — ABNORMAL HIGH (ref 145–400)
RBC: 2.27 10*6/uL — ABNORMAL LOW (ref 3.70–5.45)
WBC: 13.1 10*3/uL — ABNORMAL HIGH (ref 3.9–10.3)
lymph#: 4.1 10*3/uL — ABNORMAL HIGH (ref 0.9–3.3)

## 2011-06-29 LAB — LACTATE DEHYDROGENASE: LDH: 149 U/L (ref 94–250)

## 2011-07-02 ENCOUNTER — Other Ambulatory Visit: Payer: Self-pay

## 2011-07-02 ENCOUNTER — Telehealth: Payer: Self-pay

## 2011-07-02 DIAGNOSIS — D471 Chronic myeloproliferative disease: Secondary | ICD-10-CM

## 2011-07-02 NOTE — Telephone Encounter (Signed)
Received VM from pt requesting a call back.  Pt notes that her last Hgb was 7.4 on 12/14.  Pt states that she would like to "resist transfusions," but is questioning if she should have another lab appt before 08/10/11 as scheduled.    Note to Dr Cyndie Chime. dph

## 2011-07-02 NOTE — Telephone Encounter (Signed)
Message to schedulers per Dr Cyndie Chime - pt to have repeat CBC in 2 weeks.  dph

## 2011-07-11 ENCOUNTER — Telehealth: Payer: Self-pay | Admitting: Oncology

## 2011-07-11 NOTE — Telephone Encounter (Signed)
Talked to pt , gave her appt with MD at the end of January 2013

## 2011-07-16 ENCOUNTER — Telehealth: Payer: Self-pay

## 2011-07-16 ENCOUNTER — Other Ambulatory Visit: Payer: Self-pay | Admitting: Oncology

## 2011-07-16 ENCOUNTER — Encounter (HOSPITAL_COMMUNITY)
Admission: RE | Admit: 2011-07-16 | Discharge: 2011-07-16 | Disposition: A | Discharge status: Home / Self Care | Payer: Medicare Other | Source: Ambulatory Visit | Attending: Oncology | Admitting: Oncology

## 2011-07-16 ENCOUNTER — Other Ambulatory Visit (HOSPITAL_BASED_OUTPATIENT_CLINIC_OR_DEPARTMENT_OTHER): Payer: Medicare Other | Admitting: Lab

## 2011-07-16 ENCOUNTER — Other Ambulatory Visit: Payer: Self-pay | Admitting: Nurse Practitioner

## 2011-07-16 DIAGNOSIS — D649 Anemia, unspecified: Secondary | ICD-10-CM | POA: Insufficient documentation

## 2011-07-16 DIAGNOSIS — D47Z9 Other specified neoplasms of uncertain behavior of lymphoid, hematopoietic and related tissue: Secondary | ICD-10-CM

## 2011-07-16 DIAGNOSIS — D471 Chronic myeloproliferative disease: Secondary | ICD-10-CM

## 2011-07-16 LAB — CBC WITH DIFFERENTIAL/PLATELET
BASO%: 0.5 % (ref 0.0–2.0)
Basophils Absolute: 0.1 10e3/uL (ref 0.0–0.1)
EOS%: 3 % (ref 0.0–7.0)
Eosinophils Absolute: 0.4 10e3/uL (ref 0.0–0.5)
HCT: 19.9 % — ABNORMAL LOW (ref 34.8–46.6)
HGB: 6.4 g/dL — CL (ref 11.6–15.9)
LYMPH%: 24.3 % (ref 14.0–49.7)
MCH: 31.1 pg (ref 25.1–34.0)
MCHC: 32.2 g/dL (ref 31.5–36.0)
MCV: 96.6 fL (ref 79.5–101.0)
MONO#: 1.1 10e3/uL — ABNORMAL HIGH (ref 0.1–0.9)
MONO%: 8 % (ref 0.0–14.0)
NEUT#: 8.8 10e3/uL — ABNORMAL HIGH (ref 1.5–6.5)
NEUT%: 64.2 % (ref 38.4–76.8)
Platelets: 555 10e3/uL — ABNORMAL HIGH (ref 145–400)
RBC: 2.06 10e6/uL — ABNORMAL LOW (ref 3.70–5.45)
RDW: 29.4 % — ABNORMAL HIGH (ref 11.2–14.5)
WBC: 13.8 10e3/uL — ABNORMAL HIGH (ref 3.9–10.3)
lymph#: 3.4 10e3/uL — ABNORMAL HIGH (ref 0.9–3.3)
nRBC: 1 % — ABNORMAL HIGH (ref 0–0)

## 2011-07-16 NOTE — Telephone Encounter (Signed)
Spoke with pt re: lab results &  appt for blood transfusion on Wednesday, 1/2 at 1315.  Pt agrees and is comfortable waiting until Wednesday.  Pt confirms she is wearing a blue bloodbank bracelet.   Called admitting to initiate HAR, but was instructed to call back on Wednesday, since it is a new month.  Misty Stanley, NP to order T&C and PRBC x 2. dph

## 2011-07-18 ENCOUNTER — Ambulatory Visit (HOSPITAL_COMMUNITY)
Admission: RE | Admit: 2011-07-18 | Discharge: 2011-07-18 | Disposition: A | Discharge status: Home / Self Care | Payer: Medicare Other | Source: Ambulatory Visit | Attending: Oncology | Admitting: Oncology

## 2011-07-18 ENCOUNTER — Ambulatory Visit (HOSPITAL_BASED_OUTPATIENT_CLINIC_OR_DEPARTMENT_OTHER): Payer: Medicare Other

## 2011-07-18 VITALS — BP 125/58 | HR 69 | Temp 98.2°F

## 2011-07-18 DIAGNOSIS — D649 Anemia, unspecified: Secondary | ICD-10-CM

## 2011-07-18 LAB — HOLD TUBE, BLOOD BANK

## 2011-07-19 ENCOUNTER — Encounter (HOSPITAL_COMMUNITY): Payer: Medicare Other | Attending: Oncology

## 2011-07-19 DIAGNOSIS — D649 Anemia, unspecified: Secondary | ICD-10-CM | POA: Insufficient documentation

## 2011-07-19 LAB — TYPE AND SCREEN
Antibody Screen: NEGATIVE
Unit division: 0

## 2011-07-20 ENCOUNTER — Encounter (HOSPITAL_COMMUNITY): Payer: Medicare Other

## 2011-08-10 ENCOUNTER — Other Ambulatory Visit (HOSPITAL_BASED_OUTPATIENT_CLINIC_OR_DEPARTMENT_OTHER): Payer: Medicare Other | Admitting: Lab

## 2011-08-10 ENCOUNTER — Ambulatory Visit (HOSPITAL_BASED_OUTPATIENT_CLINIC_OR_DEPARTMENT_OTHER): Payer: Medicare Other | Admitting: Oncology

## 2011-08-10 ENCOUNTER — Telehealth: Payer: Self-pay | Admitting: Oncology

## 2011-08-10 ENCOUNTER — Other Ambulatory Visit: Payer: Medicare Other | Admitting: Lab

## 2011-08-10 ENCOUNTER — Ambulatory Visit: Payer: Medicare Other | Admitting: Oncology

## 2011-08-10 VITALS — BP 134/58 | HR 103 | Temp 99.0°F | Ht 62.5 in | Wt 173.9 lb

## 2011-08-10 DIAGNOSIS — F419 Anxiety disorder, unspecified: Secondary | ICD-10-CM

## 2011-08-10 DIAGNOSIS — F341 Dysthymic disorder: Secondary | ICD-10-CM

## 2011-08-10 DIAGNOSIS — D4620 Refractory anemia with excess of blasts, unspecified: Secondary | ICD-10-CM

## 2011-08-10 DIAGNOSIS — D72829 Elevated white blood cell count, unspecified: Secondary | ICD-10-CM

## 2011-08-10 DIAGNOSIS — D47Z9 Other specified neoplasms of uncertain behavior of lymphoid, hematopoietic and related tissue: Secondary | ICD-10-CM

## 2011-08-10 DIAGNOSIS — D649 Anemia, unspecified: Secondary | ICD-10-CM

## 2011-08-10 DIAGNOSIS — D469 Myelodysplastic syndrome, unspecified: Secondary | ICD-10-CM

## 2011-08-10 LAB — CBC WITH DIFFERENTIAL/PLATELET
Basophils Absolute: 0 10*3/uL (ref 0.0–0.1)
Eosinophils Absolute: 0.7 10*3/uL — ABNORMAL HIGH (ref 0.0–0.5)
HCT: 24.1 % — ABNORMAL LOW (ref 34.8–46.6)
HGB: 8 g/dL — ABNORMAL LOW (ref 11.6–15.9)
MONO#: 0.8 10*3/uL (ref 0.1–0.9)
NEUT%: 66.9 % (ref 38.4–76.8)
WBC: 16.7 10*3/uL — ABNORMAL HIGH (ref 3.9–10.3)
lymph#: 4 10*3/uL — ABNORMAL HIGH (ref 0.9–3.3)

## 2011-08-10 LAB — COMPREHENSIVE METABOLIC PANEL
ALT: 22 U/L (ref 0–35)
AST: 21 U/L (ref 0–37)
Albumin: 5 g/dL (ref 3.5–5.2)
CO2: 28 mEq/L (ref 19–32)
Calcium: 9.5 mg/dL (ref 8.4–10.5)
Chloride: 105 mEq/L (ref 96–112)
Creatinine, Ser: 0.72 mg/dL (ref 0.50–1.10)
Potassium: 4.2 mEq/L (ref 3.5–5.3)

## 2011-08-10 LAB — MORPHOLOGY

## 2011-08-10 LAB — LACTATE DEHYDROGENASE: LDH: 169 U/L (ref 94–250)

## 2011-08-10 NOTE — Telephone Encounter (Signed)
Talked to pt , gave her appt for  February, March , April and May with MD visit

## 2011-08-12 ENCOUNTER — Encounter: Payer: Self-pay | Admitting: Oncology

## 2011-08-12 DIAGNOSIS — D469 Myelodysplastic syndrome, unspecified: Secondary | ICD-10-CM

## 2011-08-12 DIAGNOSIS — C946 Myelodysplastic disease, not classified: Secondary | ICD-10-CM | POA: Insufficient documentation

## 2011-08-12 DIAGNOSIS — F329 Major depressive disorder, single episode, unspecified: Secondary | ICD-10-CM | POA: Insufficient documentation

## 2011-08-12 DIAGNOSIS — F32A Depression, unspecified: Secondary | ICD-10-CM

## 2011-08-12 DIAGNOSIS — F419 Anxiety disorder, unspecified: Secondary | ICD-10-CM

## 2011-08-12 HISTORY — DX: Myelodysplastic syndrome, unspecified: D46.9

## 2011-08-12 HISTORY — DX: Depression, unspecified: F32.A

## 2011-08-12 HISTORY — DX: Myelodysplastic disease, not elsewhere classified: C94.6

## 2011-08-12 HISTORY — DX: Anxiety disorder, unspecified: F41.9

## 2011-08-12 NOTE — Progress Notes (Signed)
Hematology and Oncology Follow Up Visit  LANDY MACE [id number] 07-Mar-1941 71 y.o. 08/12/2011 12:50 PM   Principle Diagnosis: Encounter Diagnoses  Name Primary?  [name] Anemia   . MDS/MPN (myelodysplastic/myeloproliferative neoplasms) Yes     Interim History:  Followup visit for this 71 year old woman with a myeloproliferative disorder with dysplastic features. She tested negative for the Jack-2 mutation. 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 Recent bone marrow done 01/25/2011 shows normal cytogenetics. No evidence for the 5Q deletion. This marrow changed little compared with the initial study done at diagnosis in April of 2008. Marrow hypercellular for age 48% with prominent erythroid proliferation, megakaryocyte proliferation with the large and abnormal forms. No excess blasts. Normal maturation in the myeloid series. dysplastic changes in the erythroid series. She had no convincing response to erythrocyte stimulating agents. She did not tolerate Aranesp. She tolerated Epogen but had no response despite dose increases. She is now requiring periodic blood transfusions. Hemoglobin has fallen as low as 6.4 g. (07/16/2011). Main symptom is fatigue which is significantly limits her daily activities.  Medications: reviewed  Allergies: GI intolerance to codeine  Review of Systems: Constitutional:   See above Respiratory: Dyspnea on exertion. Dyspnea at rest when her hemoglobin falls Cardiovascular: No ischemic type chest pain or pressure no palpitations  Gastrointestinal: No abdominal pain no change in bowel habit Genito-Urinary: No urinary tract symptoms Musculoskeletal: No bone pain Neurologic: No headache or change in vision Skin: No rash or ecchymosis Remaining ROS negative.  Physical Exam: Blood pressure 134/58, pulse 103, temperature 99 F (37.2 C), temperature source Oral, height 5' 2.5" (1.588 m), weight 173 lb 14.4 oz (78.881 kg). Wt Readings from Last 3 Encounters:    08/10/11 173 lb 14.4 oz (78.881 kg)     General appearance: Well-nourished Caucasian woman HENNT: Pharynx no erythema or exudate Lymph nodes: No adenopathy Breasts: Not examined Lungs: Clear to auscultation resonant to percussion Heart: Regular cardiac rhythm Abdomen: Soft nontender no mass no organomegaly Extremities: No edema no calf tenderness Vascular: No cyanosis Neurologic: Mental status intact cranial nerves intact motor strength 5 over 5 reflexes 1+ symmetric Skin: No rash or ecchymosis  Lab Results: Lab Results  Component Value Date   WBC 16.7* 08/10/2011   HGB 8.0* 08/10/2011   HCT 24.1* 08/10/2011   MCV 97.8 08/10/2011   PLT 603* 08/10/2011     Chemistry      Component Value Date/Time   NA 141 08/10/2011 1338   K 4.2 08/10/2011 1338   CL 105 08/10/2011 1338   CO2 28 08/10/2011 1338   BUN 12 08/10/2011 1338   CREATININE 0.72 08/10/2011 1338      Component Value Date/Time   CALCIUM 9.5 08/10/2011 1338   ALKPHOS 75 08/10/2011 1338   AST 21 08/10/2011 1338   ALT 22 08/10/2011 1338   BILITOT 2.0* 08/10/2011 1338       Radiological Studies:   Impression and Plan: Myeloproliferative disorder with dysplastic features. We had another lengthy discussion with respect to risks versus benefit of Revlimid. She is still contact so during a trial of this drug but still not ready to start. We discussed the problems with repetitive transfusions with respect to iron overload. Once about 10 transfusions are given, there is significant iron accumulation in the tissues particularly the heart and liver. If we reach that point we will need to start her on a chelating agent.   CC:. Dr. Merlene Laughter Dr. Teodora Medici   Levert Feinstein, MD 1/27/201312:50  PM  

## 2011-08-31 ENCOUNTER — Telehealth: Payer: Self-pay | Admitting: *Deleted

## 2011-08-31 ENCOUNTER — Other Ambulatory Visit: Payer: Medicare Other | Admitting: Lab

## 2011-08-31 DIAGNOSIS — C946 Myelodysplastic disease, not classified: Secondary | ICD-10-CM

## 2011-08-31 DIAGNOSIS — D469 Myelodysplastic syndrome, unspecified: Secondary | ICD-10-CM

## 2011-08-31 DIAGNOSIS — D649 Anemia, unspecified: Secondary | ICD-10-CM

## 2011-08-31 LAB — CBC WITH DIFFERENTIAL/PLATELET
BASO%: 0.4 % (ref 0.0–2.0)
Basophils Absolute: 0 10*3/uL (ref 0.0–0.1)
EOS%: 3 % (ref 0.0–7.0)
MCH: 32.7 pg (ref 25.1–34.0)
MCHC: 33.1 g/dL (ref 31.5–36.0)
MCV: 98.7 fL (ref 79.5–101.0)
MONO%: 4.2 % (ref 0.0–14.0)
RBC: 2.34 10*6/uL — ABNORMAL LOW (ref 3.70–5.45)
RDW: 29.9 % — ABNORMAL HIGH (ref 11.2–14.5)
lymph#: 3.2 10*3/uL (ref 0.9–3.3)

## 2011-08-31 NOTE — Telephone Encounter (Signed)
Pt called & notified of hgb result & pt was very happy with this result & doesn't want a transufsion at this time.

## 2011-08-31 NOTE — Telephone Encounter (Signed)
Message copied by Sabino Snipes on Fri Aug 31, 2011  6:22 PM ------      Message from: Levert Feinstein      Created: Caleen Essex Aug 31, 2011 12:19 PM       Call pt w result please  Transfusion optional

## 2011-09-20 ENCOUNTER — Telehealth: Payer: Self-pay | Admitting: Oncology

## 2011-09-20 ENCOUNTER — Other Ambulatory Visit: Payer: Self-pay

## 2011-09-20 ENCOUNTER — Ambulatory Visit (HOSPITAL_BASED_OUTPATIENT_CLINIC_OR_DEPARTMENT_OTHER): Payer: Medicare Other | Admitting: Lab

## 2011-09-20 ENCOUNTER — Encounter (HOSPITAL_COMMUNITY)
Admission: RE | Admit: 2011-09-20 | Discharge: 2011-09-20 | Disposition: A | Discharge status: Home / Self Care | Payer: Medicare Other | Source: Ambulatory Visit | Attending: Oncology | Admitting: Oncology

## 2011-09-20 ENCOUNTER — Ambulatory Visit (HOSPITAL_BASED_OUTPATIENT_CLINIC_OR_DEPARTMENT_OTHER): Payer: Medicare Other

## 2011-09-20 VITALS — BP 140/80 | HR 68 | Temp 98.3°F | Resp 18

## 2011-09-20 DIAGNOSIS — D649 Anemia, unspecified: Secondary | ICD-10-CM

## 2011-09-20 DIAGNOSIS — D469 Myelodysplastic syndrome, unspecified: Secondary | ICD-10-CM

## 2011-09-20 DIAGNOSIS — D72829 Elevated white blood cell count, unspecified: Secondary | ICD-10-CM

## 2011-09-20 LAB — CBC WITH DIFFERENTIAL/PLATELET
Basophils Absolute: 0.1 10*3/uL (ref 0.0–0.1)
Eosinophils Absolute: 0.4 10*3/uL (ref 0.0–0.5)
HGB: 6.2 g/dL — CL (ref 11.6–15.9)
MCV: 101.1 fL — ABNORMAL HIGH (ref 79.5–101.0)
MONO%: 2.8 % (ref 0.0–14.0)
NEUT#: 8.4 10*3/uL — ABNORMAL HIGH (ref 1.5–6.5)
RBC: 1.87 10*6/uL — ABNORMAL LOW (ref 3.70–5.45)
RDW: 32.6 % — ABNORMAL HIGH (ref 11.2–14.5)
WBC: 12.5 10*3/uL — ABNORMAL HIGH (ref 3.9–10.3)
lymph#: 3.3 10*3/uL (ref 0.9–3.3)

## 2011-09-20 LAB — TECHNOLOGIST REVIEW

## 2011-09-20 NOTE — Telephone Encounter (Signed)
Added appt inf appt for 3/8. gv pt new appt schedule for march - other appts remain the same.

## 2011-09-21 ENCOUNTER — Ambulatory Visit: Payer: Medicare Other

## 2011-09-21 ENCOUNTER — Other Ambulatory Visit: Payer: Medicare Other | Admitting: Lab

## 2011-09-21 LAB — TYPE AND SCREEN
ABO/RH(D): A POS
Antibody Screen: NEGATIVE

## 2011-10-10 ENCOUNTER — Telehealth: Payer: Self-pay | Admitting: Oncology

## 2011-10-10 NOTE — Telephone Encounter (Signed)
S/w the pt and she is aware of her r/s may 10th appt to 11/20/2011 due to the md will be on vac that day

## 2011-10-11 ENCOUNTER — Telehealth: Payer: Self-pay | Admitting: *Deleted

## 2011-10-11 NOTE — Telephone Encounter (Signed)
Pt called & reports that her trigger finger is worse & she saw the hand dr today, Dr. Dairl Ponder & surgery is planned for wed.  She reports that she was told that it would be minor surgery but she will have an anesthetic & will be done at the surgical center.  She would like to know if there is any problem with her labs.  She is scheduled for lab on Monday at our office but if Dr. Cyndie Chime is OK based on previous labs, it may be scheduled earlier.  Dr. Ronie Spies office is closed tomorrow, therefore if Dr. Cyndie Chime is able to tell her something today she could let Dr. Ronie Spies office know something today.

## 2011-10-12 ENCOUNTER — Other Ambulatory Visit (HOSPITAL_BASED_OUTPATIENT_CLINIC_OR_DEPARTMENT_OTHER): Payer: Medicare Other

## 2011-10-12 ENCOUNTER — Encounter: Payer: Self-pay | Admitting: *Deleted

## 2011-10-12 DIAGNOSIS — D469 Myelodysplastic syndrome, unspecified: Secondary | ICD-10-CM

## 2011-10-12 DIAGNOSIS — C946 Myelodysplastic disease, not classified: Secondary | ICD-10-CM

## 2011-10-12 DIAGNOSIS — D649 Anemia, unspecified: Secondary | ICD-10-CM

## 2011-10-12 LAB — CBC WITH DIFFERENTIAL/PLATELET
Basophils Absolute: 0.1 10*3/uL (ref 0.0–0.1)
HCT: 26.6 % — ABNORMAL LOW (ref 34.8–46.6)
HGB: 8.7 g/dL — ABNORMAL LOW (ref 11.6–15.9)
MCH: 30.9 pg (ref 25.1–34.0)
MONO#: 1.3 10*3/uL — ABNORMAL HIGH (ref 0.1–0.9)
NEUT%: 58.3 % (ref 38.4–76.8)
Platelets: 642 10*3/uL — ABNORMAL HIGH (ref 145–400)
WBC: 14.6 10*3/uL — ABNORMAL HIGH (ref 3.9–10.3)
lymph#: 4.2 10*3/uL — ABNORMAL HIGH (ref 0.9–3.3)

## 2011-10-12 NOTE — Progress Notes (Signed)
Pt picked up Handicap form today.

## 2011-10-15 ENCOUNTER — Telehealth: Payer: Self-pay

## 2011-10-15 NOTE — Telephone Encounter (Signed)
Message copied by Albertha Ghee on Mon Oct 15, 2011 10:58 AM ------      Message from: Levert Feinstein      Created: Fri Oct 12, 2011  5:19 PM       Call pt Hb 8.7  OK for hand surgery

## 2011-10-15 NOTE — Telephone Encounter (Signed)
Message left on personalized VM per Dr Patsy Lager attached note. dph

## 2011-10-23 ENCOUNTER — Telehealth: Payer: Self-pay | Admitting: *Deleted

## 2011-10-23 NOTE — Telephone Encounter (Signed)
patient confirmed over the phone the new date and time 

## 2011-11-02 ENCOUNTER — Other Ambulatory Visit (HOSPITAL_BASED_OUTPATIENT_CLINIC_OR_DEPARTMENT_OTHER): Payer: Medicare Other | Admitting: Lab

## 2011-11-02 ENCOUNTER — Telehealth: Payer: Self-pay | Admitting: *Deleted

## 2011-11-02 ENCOUNTER — Other Ambulatory Visit: Payer: Self-pay | Admitting: *Deleted

## 2011-11-02 DIAGNOSIS — D469 Myelodysplastic syndrome, unspecified: Secondary | ICD-10-CM

## 2011-11-02 DIAGNOSIS — D649 Anemia, unspecified: Secondary | ICD-10-CM

## 2011-11-02 LAB — CBC WITH DIFFERENTIAL/PLATELET
BASO%: 0.9 % (ref 0.0–2.0)
Basophils Absolute: 0.1 10*3/uL (ref 0.0–0.1)
EOS%: 3.2 % (ref 0.0–7.0)
Eosinophils Absolute: 0.4 10*3/uL (ref 0.0–0.5)
HCT: 22 % — ABNORMAL LOW (ref 34.8–46.6)
HGB: 7 g/dL — ABNORMAL LOW (ref 11.6–15.9)
MONO#: 1 10*3/uL — ABNORMAL HIGH (ref 0.1–0.9)
MONO%: 7.7 % (ref 0.0–14.0)
NEUT#: 8 10*3/uL — ABNORMAL HIGH (ref 1.5–6.5)
NEUT%: 59.3 % (ref 38.4–76.8)
Platelets: 537 10*3/uL — ABNORMAL HIGH (ref 145–400)
WBC: 13.6 10*3/uL — ABNORMAL HIGH (ref 3.9–10.3)

## 2011-11-02 LAB — TECHNOLOGIST REVIEW

## 2011-11-02 NOTE — Telephone Encounter (Signed)
I have scheduled the patient for lab/transfuison on Tuesday next week. I have called and gave the aptient the appt time and day.  JMW

## 2011-11-05 ENCOUNTER — Other Ambulatory Visit: Payer: Self-pay | Admitting: Certified Registered Nurse Anesthetist

## 2011-11-05 ENCOUNTER — Encounter (HOSPITAL_COMMUNITY)
Admission: RE | Admit: 2011-11-05 | Discharge: 2011-11-05 | Disposition: A | Discharge status: Home / Self Care | Payer: Medicare Other | Source: Ambulatory Visit | Attending: Oncology | Admitting: Oncology

## 2011-11-05 DIAGNOSIS — D649 Anemia, unspecified: Secondary | ICD-10-CM

## 2011-11-06 ENCOUNTER — Other Ambulatory Visit: Payer: Self-pay

## 2011-11-06 ENCOUNTER — Ambulatory Visit (HOSPITAL_BASED_OUTPATIENT_CLINIC_OR_DEPARTMENT_OTHER): Payer: Medicare Other

## 2011-11-06 ENCOUNTER — Other Ambulatory Visit: Payer: Self-pay | Admitting: Nurse Practitioner

## 2011-11-06 ENCOUNTER — Other Ambulatory Visit (HOSPITAL_BASED_OUTPATIENT_CLINIC_OR_DEPARTMENT_OTHER): Payer: Medicare Other | Admitting: Lab

## 2011-11-06 VITALS — BP 131/67 | HR 72 | Temp 98.1°F | Resp 20

## 2011-11-06 DIAGNOSIS — D47Z9 Other specified neoplasms of uncertain behavior of lymphoid, hematopoietic and related tissue: Secondary | ICD-10-CM

## 2011-11-06 DIAGNOSIS — D649 Anemia, unspecified: Secondary | ICD-10-CM

## 2011-11-06 DIAGNOSIS — D4620 Refractory anemia with excess of blasts, unspecified: Secondary | ICD-10-CM

## 2011-11-06 LAB — CBC WITH DIFFERENTIAL/PLATELET
BASO%: 0.4 % (ref 0.0–2.0)
Eosinophils Absolute: 0.4 10*3/uL (ref 0.0–0.5)
HCT: 23 % — ABNORMAL LOW (ref 34.8–46.6)
MCHC: 32.2 g/dL (ref 31.5–36.0)
MONO#: 1.1 10*3/uL — ABNORMAL HIGH (ref 0.1–0.9)
NEUT#: 8.2 10*3/uL — ABNORMAL HIGH (ref 1.5–6.5)
NEUT%: 58.7 % (ref 38.4–76.8)
Platelets: 555 10*3/uL — ABNORMAL HIGH (ref 145–400)
RBC: 2.41 10*6/uL — ABNORMAL LOW (ref 3.70–5.45)
WBC: 13.9 10*3/uL — ABNORMAL HIGH (ref 3.9–10.3)
lymph#: 4.2 10*3/uL — ABNORMAL HIGH (ref 0.9–3.3)

## 2011-11-06 LAB — TECHNOLOGIST REVIEW

## 2011-11-06 MED ORDER — SODIUM CHLORIDE 0.9 % IV SOLN: 250.0000 mL | Freq: Once | INTRAVENOUS | Status: DC

## 2011-11-06 MED ORDER — LORAZEPAM 2 MG/ML IJ SOLN
1.0000 mg | INTRAMUSCULAR | Status: DC | PRN
  Administered 2011-11-06: 1 mg via INTRAVENOUS

## 2011-11-06 MED ORDER — SODIUM CHLORIDE 0.9 % IJ SOLN
10.0000 mL | INTRAMUSCULAR | Status: DC | PRN
  Filled 2011-11-06: qty 10

## 2011-11-06 MED ORDER — HEPARIN SOD (PORK) LOCK FLUSH 100 UNIT/ML IV SOLN
500.0000 [IU] | Freq: Every day | INTRAVENOUS | Status: DC | PRN
  Filled 2011-11-06: qty 5

## 2011-11-07 ENCOUNTER — Other Ambulatory Visit: Payer: Self-pay

## 2011-11-07 DIAGNOSIS — D469 Myelodysplastic syndrome, unspecified: Secondary | ICD-10-CM

## 2011-11-07 LAB — TYPE AND SCREEN
ABO/RH(D): A POS
Antibody Screen: NEGATIVE
Unit division: 0

## 2011-11-07 MED ORDER — LORAZEPAM 0.5 MG PO TABS: 0.5000 mg | ORAL_TABLET | Freq: Three times a day (TID) | ORAL | Status: AC | PRN

## 2011-11-20 ENCOUNTER — Other Ambulatory Visit: Payer: Medicare Other

## 2011-11-20 ENCOUNTER — Ambulatory Visit: Payer: Medicare Other | Admitting: Oncology

## 2011-11-23 ENCOUNTER — Other Ambulatory Visit: Payer: Medicare Other | Admitting: Lab

## 2011-11-23 ENCOUNTER — Ambulatory Visit: Payer: Medicare Other | Admitting: Oncology

## 2011-11-27 ENCOUNTER — Ambulatory Visit (HOSPITAL_BASED_OUTPATIENT_CLINIC_OR_DEPARTMENT_OTHER): Payer: Medicare Other | Admitting: Oncology

## 2011-11-27 ENCOUNTER — Other Ambulatory Visit (HOSPITAL_BASED_OUTPATIENT_CLINIC_OR_DEPARTMENT_OTHER): Payer: Medicare Other | Admitting: Lab

## 2011-11-27 VITALS — BP 135/68 | HR 94 | Temp 97.1°F | Ht 63.0 in | Wt 174.7 lb

## 2011-11-27 DIAGNOSIS — D47Z9 Other specified neoplasms of uncertain behavior of lymphoid, hematopoietic and related tissue: Secondary | ICD-10-CM

## 2011-11-27 DIAGNOSIS — D469 Myelodysplastic syndrome, unspecified: Secondary | ICD-10-CM

## 2011-11-27 DIAGNOSIS — F341 Dysthymic disorder: Secondary | ICD-10-CM

## 2011-11-27 DIAGNOSIS — D649 Anemia, unspecified: Secondary | ICD-10-CM

## 2011-11-27 LAB — CBC WITH DIFFERENTIAL/PLATELET
Eosinophils Absolute: 0.5 10*3/uL (ref 0.0–0.5)
HCT: 24.2 % — ABNORMAL LOW (ref 34.8–46.6)
LYMPH%: 27.2 % (ref 14.0–49.7)
MCHC: 33.1 g/dL (ref 31.5–36.0)
MCV: 94.2 fL (ref 79.5–101.0)
MONO%: 8.8 % (ref 0.0–14.0)
NEUT#: 8.1 10*3/uL — ABNORMAL HIGH (ref 1.5–6.5)
NEUT%: 60.2 % (ref 38.4–76.8)
Platelets: 532 10*3/uL — ABNORMAL HIGH (ref 145–400)
RBC: 2.57 10*6/uL — ABNORMAL LOW (ref 3.70–5.45)
nRBC: 1 % — ABNORMAL HIGH (ref 0–0)

## 2011-11-27 NOTE — Progress Notes (Signed)
Hematology and Oncology Follow Up Visit  Carolyn Brennan [id number] [birth year] 71 y.o. 11/27/2011 6:55 PM   Principle Diagnosis: Encounter Diagnosis  Name Primary?  [name] MDS/MPN (myelodysplastic/myeloproliferative neoplasms) Yes     Interim History:   Followup visit for this 71 year old woman with a myeloproliferative disorder with dysplastic features. Blood counts as far back as July 2007 showed a macrocytic anemia with hemoglobin 11 g, leukocytosis with white count 13,600, and mild elevation of her platelet count of 399,000. She was referred to our office in April 2008. Initial bone marrow done by my partner Dr. Myna Hidalgo showed a hypercellular marrow with ring sideroblasts. Routine cytogenetics were normal. FISH studies not done on the marrow. She tested negative for the Jack-2 mutation. Most recent bone marrow done 01/25/2011 shows normal cytogenetics. No evidence for the 5Q deletion on FISH. This marrow changed little compared with the initial study done at diagnosis in April of 2008. Marrow hypercellular for age 64% with prominent erythroid proliferation, megakaryocyte proliferation with the large and abnormal forms. No excess blasts. Normal maturation in the myeloid series. dysplastic changes in the erythroid series.  She had no convincing response to erythrocyte stimulating agents. She did not tolerate Aranesp. She tolerated Epogen but had no response despite dose increases.  She is now requiring periodic blood transfusions. Hemoglobin has fallen as low as 6.4 g. (07/16/2011).  Main symptom is fatigue which is significantly limits her daily activities. She got a blood transfusion last on April 23. Hemoglobin was 7 g on April 19. She had energized by this transfusion. Now 3 weeks later hemoglobin is 8 g. She is in good spirits today. 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 She has found new meaning in  her life.  Medications: reviewed  Allergies: No Known Allergies  Review of Systems: Constitutional:   Minimal constitutional symptoms following recent transfusion Respiratory: No cough or dyspnea Cardiovascular:  No chest pain or palpitation Gastrointestinal: She complains of a lot of gas lately Genito-Urinary: No urinary tract symptoms Musculoskeletal: No muscle or bone pain Neurologic: No headache or change in vision Skin: No rash or ecchymosis She reports she had a recent followup exam by her gynecologist. She had recent surgery on a tendon in her left hand. Remaining ROS negative.  Physical Exam: Blood pressure 135/68, pulse 94, temperature 97.1 F (36.2 C), temperature source Oral, height 5\' 3"  (1.6 m), weight 174 lb 11.2 oz (79.243 kg). Wt Readings from Last 3 Encounters:  11/27/11 174 lb 11.2 oz (79.243 kg)  08/10/11 173 lb 14.4 oz (78.881 kg)     General appearance: Well-nourished Caucasian woman HENNT: Pharynx no erythema or exudate Lymph nodes: No lymphadenopathy Breasts: Not examined Lungs: Clear to auscultation resonant to percussion Heart: Regular rhythm no murmur Abdomen: Soft nontender no mass no organomegaly Extremities: No edema no calf tenderness Vascular: No cyanosis Neurologic: Motor strength 5 over 5 reflexes 2+ symmetric Skin: No rash or ecchymosis  Lab Results: Lab Results  Component Value Date   WBC 13.4* 11/27/2011   HGB 8.0* 11/27/2011   HCT 24.2* 11/27/2011   MCV 94.2 11/27/2011   PLT 532* 11/27/2011     Chemistry      Component Value Date/Time   NA 141 08/10/2011 1338   K 4.2 08/10/2011 1338   CL 105 08/10/2011 1338   CO2 28 08/10/2011 1338   BUN 12 08/10/2011 1338   CREATININE 0.72 08/10/2011 1338  Component Value Date/Time   CALCIUM 9.5 08/10/2011 1338   ALKPHOS 75 08/10/2011 1338   AST 21 08/10/2011 1338   ALT 22 08/10/2011 1338   BILITOT 2.0* 08/10/2011 1338       Impression and Plan: #1. Myeloproliferative disorder with  dysplastic features. Plan is continue periodic transfusion support. We are checking lab every 3 weeks. We discussed potential problems related to iron overload with repetitive transfusions at time of her visit here in January. We reviewed this again today. I will monitor her ferritin levels and begin chelation therapy if indicated in the future.  #2. Chronic anxiety and depression. She is in a much better frame of mind today than she has been in the past.  CC:. Dr. Langston Masker; Dr. Leda Quail   Levert Feinstein, MD 5/14/20136:55 PM

## 2011-11-28 LAB — SAMPLE TO BLOOD BANK

## 2011-12-03 ENCOUNTER — Telehealth: Payer: Self-pay | Admitting: Oncology

## 2011-12-03 ENCOUNTER — Ambulatory Visit: Payer: Medicare Other | Admitting: Oncology

## 2011-12-03 NOTE — Telephone Encounter (Signed)
Talked to pt, gave her all the appts until September 2013

## 2011-12-18 ENCOUNTER — Encounter (HOSPITAL_COMMUNITY)
Admission: RE | Admit: 2011-12-18 | Discharge: 2011-12-18 | Disposition: A | Discharge status: Home / Self Care | Payer: Medicare Other | Source: Ambulatory Visit | Attending: Oncology | Admitting: Oncology

## 2011-12-18 ENCOUNTER — Other Ambulatory Visit: Payer: Medicare Other | Admitting: Lab

## 2011-12-18 ENCOUNTER — Ambulatory Visit (HOSPITAL_BASED_OUTPATIENT_CLINIC_OR_DEPARTMENT_OTHER): Payer: Medicare Other | Admitting: Lab

## 2011-12-18 ENCOUNTER — Other Ambulatory Visit: Payer: Self-pay | Admitting: *Deleted

## 2011-12-18 DIAGNOSIS — F419 Anxiety disorder, unspecified: Secondary | ICD-10-CM

## 2011-12-18 DIAGNOSIS — D649 Anemia, unspecified: Secondary | ICD-10-CM

## 2011-12-18 DIAGNOSIS — F329 Major depressive disorder, single episode, unspecified: Secondary | ICD-10-CM

## 2011-12-18 DIAGNOSIS — D469 Myelodysplastic syndrome, unspecified: Secondary | ICD-10-CM

## 2011-12-18 LAB — PREPARE RBC (CROSSMATCH)

## 2011-12-18 LAB — CBC WITH DIFFERENTIAL/PLATELET
Eosinophils Absolute: 0.3 10*3/uL (ref 0.0–0.5)
HCT: 20.3 % — ABNORMAL LOW (ref 34.8–46.6)
LYMPH%: 23.5 % (ref 14.0–49.7)
MCHC: 32.8 g/dL (ref 31.5–36.0)
MCV: 100.7 fL (ref 79.5–101.0)
MONO%: 6.9 % (ref 0.0–14.0)
NEUT#: 6.9 10*3/uL — ABNORMAL HIGH (ref 1.5–6.5)
NEUT%: 65.4 % (ref 38.4–76.8)
Platelets: 420 10*3/uL — ABNORMAL HIGH (ref 145–400)
RBC: 2.02 10*6/uL — ABNORMAL LOW (ref 3.70–5.45)
nRBC: 0 % (ref 0–0)

## 2011-12-18 LAB — COMPREHENSIVE METABOLIC PANEL
ALT: 15 U/L (ref 0–35)
BUN: 14 mg/dL (ref 6–23)
CO2: 20 mEq/L (ref 19–32)
Creatinine, Ser: 0.64 mg/dL (ref 0.50–1.10)
Total Bilirubin: 2.4 mg/dL — ABNORMAL HIGH (ref 0.3–1.2)

## 2011-12-18 LAB — LACTATE DEHYDROGENASE: LDH: 149 U/L (ref 94–250)

## 2011-12-18 LAB — MORPHOLOGY

## 2011-12-18 LAB — FERRITIN: Ferritin: 1174 ng/mL — ABNORMAL HIGH (ref 10–291)

## 2011-12-18 MED ORDER — LORAZEPAM 2 MG/ML IJ SOLN: 1.0000 mg | Freq: Once | INTRAMUSCULAR | Status: DC

## 2011-12-19 ENCOUNTER — Ambulatory Visit (HOSPITAL_BASED_OUTPATIENT_CLINIC_OR_DEPARTMENT_OTHER): Payer: Medicare Other

## 2011-12-19 VITALS — BP 119/63 | HR 71 | Temp 97.6°F | Resp 20

## 2011-12-19 DIAGNOSIS — D649 Anemia, unspecified: Secondary | ICD-10-CM

## 2011-12-19 LAB — PREPARE RBC (CROSSMATCH)

## 2011-12-19 MED ORDER — SODIUM CHLORIDE 0.9 % IV SOLN
250.0000 mL | Freq: Once | INTRAVENOUS | Status: AC
  Administered 2011-12-19: 250 mL via INTRAVENOUS

## 2011-12-19 MED ORDER — LORAZEPAM 2 MG/ML IJ SOLN
1.0000 mg | Freq: Once | INTRAMUSCULAR | Status: AC
  Administered 2011-12-19: 1 mg via INTRAVENOUS

## 2011-12-19 NOTE — Patient Instructions (Signed)
Blood Transfusion Information  WHAT IS A BLOOD TRANSFUSION?  A transfusion is the replacement of blood or some of its parts. Blood is made up of multiple cells which provide different functions.   Red blood cells carry oxygen and are used for blood loss replacement.   White blood cells fight against infection.   Platelets control bleeding.   Plasma helps clot blood.   Other blood products are available for specialized needs, such as hemophilia or other clotting disorders.  BEFORE THE TRANSFUSION   Who gives blood for transfusions?    You may be able to donate blood to be used at a later date on yourself (autologous donation).   Relatives can be asked to donate blood. This is generally not any safer than if you have received blood from a stranger. The same precautions are taken to ensure safety when a relative's blood is donated.   Healthy volunteers who are fully evaluated to make sure their blood is safe. This is blood bank blood.  Transfusion therapy is the safest it has ever been in the practice of medicine. Before blood is taken from a donor, a complete history is taken to make sure that person has no history of diseases nor engages in risky social behavior (examples are intravenous drug use or sexual activity with multiple partners). The donor's travel history is screened to minimize risk of transmitting infections, such as malaria. The donated blood is tested for signs of infectious diseases, such as HIV and hepatitis. The blood is then tested to be sure it is compatible with you in order to minimize the chance of a transfusion reaction. If you or a relative donates blood, this is often done in anticipation of surgery and is not appropriate for emergency situations. It takes many days to process the donated blood.  RISKS AND COMPLICATIONS  Although transfusion therapy is very safe and saves many lives, the main dangers of transfusion include:    Getting an infectious disease.   Developing a  transfusion reaction. This is an allergic reaction to something in the blood you were given. Every precaution is taken to prevent this.  The decision to have a blood transfusion has been considered carefully by your caregiver before blood is given. Blood is not given unless the benefits outweigh the risks.  AFTER THE TRANSFUSION   Right after receiving a blood transfusion, you will usually feel much better and more energetic. This is especially true if your red blood cells have gotten low (anemic). The transfusion raises the level of the red blood cells which carry oxygen, and this usually causes an energy increase.   The nurse administering the transfusion will monitor you carefully for complications.  HOME CARE INSTRUCTIONS   No special instructions are needed after a transfusion. You may find your energy is better. Speak with your caregiver about any limitations on activity for underlying diseases you may have.  SEEK MEDICAL CARE IF:    Your condition is not improving after your transfusion.   You develop redness or irritation at the intravenous (IV) site.  SEEK IMMEDIATE MEDICAL CARE IF:   Any of the following symptoms occur over the next 12 hours:   Shaking chills.   You have a temperature by mouth above 102 F (38.9 C), not controlled by medicine.   Chest, back, or muscle pain.   People around you feel you are not acting correctly or are confused.   Shortness of breath or difficulty breathing.   Dizziness and fainting.     You get a rash or develop hives.   You have a decrease in urine output.   Your urine turns a dark color or changes to pink, red, or brown.  Any of the following symptoms occur over the next 10 days:   You have a temperature by mouth above 102 F (38.9 C), not controlled by medicine.   Shortness of breath.   Weakness after normal activity.   The white part of the eye turns yellow (jaundice).   You have a decrease in the amount of urine or are urinating less often.   Your  urine turns a dark color or changes to pink, red, or brown.  Document Released: 06/29/2000 Document Revised: 06/21/2011 Document Reviewed: 02/16/2008  ExitCare Patient Information 2012 [employer].

## 2011-12-20 LAB — TYPE AND SCREEN
ABO/RH(D): A POS
Antibody Screen: NEGATIVE
Unit division: 0

## 2011-12-27 ENCOUNTER — Telehealth: Payer: Self-pay | Admitting: *Deleted

## 2011-12-27 NOTE — Telephone Encounter (Signed)
Pt notified that Dr. Cyndie Chime did not think any of her symptoms were related to the blood transfusions & she should call back if symptoms are worse.

## 2011-12-27 NOTE — Telephone Encounter (Signed)
Received vm call from pt stating that she is feeling fine/OK/good actually but thinks she might have something normal related to the 5 blood transfusions she has had.  She reports that she has some pea size bruises on her body that come & go & she hasn't done anything to hurt herself.  She also reports places that itch, particularly left leg b/t knee & thigh. Returned call & left message to call back. Note to Dr.Granfortuna.

## 2012-01-08 ENCOUNTER — Other Ambulatory Visit (HOSPITAL_BASED_OUTPATIENT_CLINIC_OR_DEPARTMENT_OTHER): Payer: Medicare Other | Admitting: Lab

## 2012-01-08 DIAGNOSIS — D649 Anemia, unspecified: Secondary | ICD-10-CM

## 2012-01-08 DIAGNOSIS — D47Z9 Other specified neoplasms of uncertain behavior of lymphoid, hematopoietic and related tissue: Secondary | ICD-10-CM

## 2012-01-08 DIAGNOSIS — D469 Myelodysplastic syndrome, unspecified: Secondary | ICD-10-CM

## 2012-01-08 LAB — CBC WITH DIFFERENTIAL/PLATELET
BASO%: 0.4 % (ref 0.0–2.0)
Basophils Absolute: 0 10*3/uL (ref 0.0–0.1)
EOS%: 3.6 % (ref 0.0–7.0)
MCH: 30.7 pg (ref 25.1–34.0)
MCHC: 32.2 g/dL (ref 31.5–36.0)
MCV: 95.3 fL (ref 79.5–101.0)
MONO%: 7.5 % (ref 0.0–14.0)
NEUT%: 57.5 % (ref 38.4–76.8)
RDW: 23.6 % — ABNORMAL HIGH (ref 11.2–14.5)
lymph#: 3.5 10*3/uL — ABNORMAL HIGH (ref 0.9–3.3)

## 2012-01-08 LAB — MORPHOLOGY

## 2012-01-29 ENCOUNTER — Other Ambulatory Visit (HOSPITAL_BASED_OUTPATIENT_CLINIC_OR_DEPARTMENT_OTHER): Payer: Medicare Other | Admitting: Lab

## 2012-01-29 DIAGNOSIS — D47Z9 Other specified neoplasms of uncertain behavior of lymphoid, hematopoietic and related tissue: Secondary | ICD-10-CM

## 2012-01-29 DIAGNOSIS — D469 Myelodysplastic syndrome, unspecified: Secondary | ICD-10-CM

## 2012-01-29 LAB — CBC WITH DIFFERENTIAL/PLATELET
BASO%: 0.5 % (ref 0.0–2.0)
Eosinophils Absolute: 0.4 10*3/uL (ref 0.0–0.5)
MCHC: 32.9 g/dL (ref 31.5–36.0)
MONO#: 1 10*3/uL — ABNORMAL HIGH (ref 0.1–0.9)
NEUT#: 8.3 10*3/uL — ABNORMAL HIGH (ref 1.5–6.5)
Platelets: 542 10*3/uL — ABNORMAL HIGH (ref 145–400)
RBC: 2.42 10*6/uL — ABNORMAL LOW (ref 3.70–5.45)
WBC: 13.1 10*3/uL — ABNORMAL HIGH (ref 3.9–10.3)
lymph#: 3.4 10*3/uL — ABNORMAL HIGH (ref 0.9–3.3)
nRBC: 1 % — ABNORMAL HIGH (ref 0–0)

## 2012-01-29 LAB — MORPHOLOGY

## 2012-01-30 ENCOUNTER — Telehealth: Payer: Self-pay | Admitting: *Deleted

## 2012-01-30 NOTE — Telephone Encounter (Signed)
Spoke to pt yest. after her labs & she reports feeling OK & doesn't feel that she needs blood at this time.

## 2012-02-18 ENCOUNTER — Ambulatory Visit (HOSPITAL_BASED_OUTPATIENT_CLINIC_OR_DEPARTMENT_OTHER): Payer: Medicare Other

## 2012-02-18 ENCOUNTER — Other Ambulatory Visit: Payer: Self-pay | Admitting: Nurse Practitioner

## 2012-02-18 ENCOUNTER — Other Ambulatory Visit (HOSPITAL_BASED_OUTPATIENT_CLINIC_OR_DEPARTMENT_OTHER): Payer: Medicare Other | Admitting: Lab

## 2012-02-18 ENCOUNTER — Encounter (HOSPITAL_COMMUNITY)
Admission: RE | Admit: 2012-02-18 | Discharge: 2012-02-18 | Disposition: A | Discharge status: Home / Self Care | Payer: Medicare Other | Source: Ambulatory Visit | Attending: Oncology | Admitting: Oncology

## 2012-02-18 ENCOUNTER — Telehealth: Payer: Self-pay | Admitting: Oncology

## 2012-02-18 VITALS — BP 119/53 | HR 76 | Temp 98.0°F | Resp 20

## 2012-02-18 DIAGNOSIS — D696 Thrombocytopenia, unspecified: Secondary | ICD-10-CM

## 2012-02-18 DIAGNOSIS — D649 Anemia, unspecified: Secondary | ICD-10-CM

## 2012-02-18 DIAGNOSIS — D469 Myelodysplastic syndrome, unspecified: Secondary | ICD-10-CM

## 2012-02-18 LAB — CBC WITH DIFFERENTIAL/PLATELET
Basophils Absolute: 0 10*3/uL (ref 0.0–0.1)
EOS%: 2.9 % (ref 0.0–7.0)
HCT: 20.5 % — ABNORMAL LOW (ref 34.8–46.6)
HGB: 6.7 g/dL — CL (ref 11.6–15.9)
LYMPH%: 21.9 % (ref 14.0–49.7)
MCH: 31.5 pg (ref 25.1–34.0)
MCHC: 32.7 g/dL (ref 31.5–36.0)
MCV: 96.2 fL (ref 79.5–101.0)
MONO%: 6.2 % (ref 0.0–14.0)
NEUT%: 68.7 % (ref 38.4–76.8)
Platelets: 552 10*3/uL — ABNORMAL HIGH (ref 145–400)
lymph#: 3 10*3/uL (ref 0.9–3.3)

## 2012-02-18 LAB — MORPHOLOGY

## 2012-02-18 MED ORDER — SODIUM CHLORIDE 0.9 % IV SOLN
250.0000 mL | Freq: Once | INTRAVENOUS | Status: AC
  Administered 2012-02-18: 250 mL via INTRAVENOUS

## 2012-02-18 NOTE — Patient Instructions (Signed)
Blood Transfusion Information WHAT IS A BLOOD TRANSFUSION? A transfusion is the replacement of blood or some of its parts. Blood is made up of multiple cells which provide different functions.  Red blood cells carry oxygen and are used for blood loss replacement.   White blood cells fight against infection.   Platelets control bleeding.   Plasma helps clot blood.   Other blood products are available for specialized needs, such as hemophilia or other clotting disorders.  BEFORE THE TRANSFUSION  Who gives blood for transfusions?   You may be able to donate blood to be used at a later date on yourself (autologous donation).   Relatives can be asked to donate blood. This is generally not any safer than if you have received blood from a stranger. The same precautions are taken to ensure safety when a relative's blood is donated.   Healthy volunteers who are fully evaluated to make sure their blood is safe. This is blood bank blood.  Transfusion therapy is the safest it has ever been in the practice of medicine. Before blood is taken from a donor, a complete history is taken to make sure that person has no history of diseases nor engages in risky social behavior (examples are intravenous drug use or sexual activity with multiple partners). The donor's travel history is screened to minimize risk of transmitting infections, such as malaria. The donated blood is tested for signs of infectious diseases, such as HIV and hepatitis. The blood is then tested to be sure it is compatible with you in order to minimize the chance of a transfusion reaction. If you or a relative donates blood, this is often done in anticipation of surgery and is not appropriate for emergency situations. It takes many days to process the donated blood. RISKS AND COMPLICATIONS Although transfusion therapy is very safe and saves many lives, the main dangers of transfusion include:   Getting an infectious disease.   Developing a  transfusion reaction. This is an allergic reaction to something in the blood you were given. Every precaution is taken to prevent this.  The decision to have a blood transfusion has been considered carefully by your caregiver before blood is given. Blood is not given unless the benefits outweigh the risks. AFTER THE TRANSFUSION  Right after receiving a blood transfusion, you will usually feel much better and more energetic. This is especially true if your red blood cells have gotten low (anemic). The transfusion raises the level of the red blood cells which carry oxygen, and this usually causes an energy increase.   The nurse administering the transfusion will monitor you carefully for complications.  HOME CARE INSTRUCTIONS  No special instructions are needed after a transfusion. You may find your energy is better. Speak with your caregiver about any limitations on activity for underlying diseases you may have. SEEK MEDICAL CARE IF:   Your condition is not improving after your transfusion.   You develop redness or irritation at the intravenous (IV) site.  SEEK IMMEDIATE MEDICAL CARE IF:  Any of the following symptoms occur over the next 12 hours:  Shaking chills.   You have a temperature by mouth above 102 F (38.9 C), not controlled by medicine.   Chest, back, or muscle pain.   People around you feel you are not acting correctly or are confused.   Shortness of breath or difficulty breathing.   Dizziness and fainting.   You get a rash or develop hives.   You have a decrease   in urine output.   Your urine turns a dark color or changes to pink, red, or brown.  Any of the following symptoms occur over the next 10 days:  You have a temperature by mouth above 102 F (38.9 C), not controlled by medicine.   Shortness of breath.   Weakness after normal activity.   The white part of the eye turns yellow (jaundice).   You have a decrease in the amount of urine or are urinating less  often.   Your urine turns a dark color or changes to pink, red, or brown.  Document Released: 06/29/2000 Document Revised: 06/21/2011 Document Reviewed: 02/16/2008 ExitCare Patient Information 2012 [employer].Blood Products Information This is information about transfusions of blood products. All blood that is to be transfused is tested for blood type, compatibility with the recipient, and for infections. Except in emergencies, giving a transfusion requires a written consent. Blood transfusions are often given as packed red blood cells. This means the other parts of the blood have been taken out. Blood may be needed to treat severe anemia or bleeding. Other blood products include plasma, platelets, immune globulin, and cryoprecipitate. Blood for transfusion is mostly donated by volunteers. The blood donors are carefully screened for risk factors that could cause disease. Donors are all tested for infections that could be transmitted by blood. The blood product supply today is the safest it has ever been. Some risks do remain.  A minor reaction with fever, chills, or rash happens in about 1% of blood product transfusions.   Life-threatening reactions occur in less than 1 in a million transfusions.   Infection with germs (bacteria), viruses or parasites like malaria can still happen. The risk is very low.   Hepatitis B occurs in about 1 case in 150,000 transfusions.   Hepatitis C is seen once in 500,000.   HIV is transmitted less than once every million transfusions.  When you receive a transfusion of packed red blood cells, your blood is tested for blood group and Rh type. Your blood is also screened for antibodies that could cause a serious reaction. A cross-match test is done to make sure the blood is safe to give.  Talk with your caregiver if you have any concerns about receiving a transfusion of blood products. Make sure your questions are answered. Transfusions are not given if your  caregiver feels the risk is greater than the need. Document Released: 07/02/2005 Document Revised: 06/21/2011 Document Reviewed: 12/20/2006 Exit[employer] Patient Information 2012 [employer]. 

## 2012-02-18 NOTE — Telephone Encounter (Signed)
Added blood for today and 8/7 per Jan. Pt given schedule for August/September by Jan.

## 2012-02-19 ENCOUNTER — Other Ambulatory Visit: Payer: Medicare Other | Admitting: Lab

## 2012-02-20 ENCOUNTER — Other Ambulatory Visit: Payer: Self-pay | Admitting: Oncology

## 2012-02-20 ENCOUNTER — Ambulatory Visit (HOSPITAL_BASED_OUTPATIENT_CLINIC_OR_DEPARTMENT_OTHER): Payer: Medicare Other

## 2012-02-20 ENCOUNTER — Other Ambulatory Visit: Payer: Self-pay | Admitting: *Deleted

## 2012-02-20 VITALS — BP 128/68 | HR 67 | Temp 98.9°F | Resp 20

## 2012-02-20 DIAGNOSIS — D649 Anemia, unspecified: Secondary | ICD-10-CM

## 2012-02-20 MED ORDER — SODIUM CHLORIDE 0.9 % IV SOLN
250.0000 mL | Freq: Once | INTRAVENOUS | Status: AC
  Administered 2012-02-20: 250 mL via INTRAVENOUS

## 2012-02-20 NOTE — Progress Notes (Unsigned)
1355 Patient states she is having heaviness in her chest accompanied by left sided chest pain. Patient states it's harder to breathe than normal.  Vitals signs stable. Dr. Cyndie Chime notified.

## 2012-02-20 NOTE — Patient Instructions (Signed)
Blood Transfusion Information  WHAT IS A BLOOD TRANSFUSION?  A transfusion is the replacement of blood or some of its parts. Blood is made up of multiple cells which provide different functions.   Red blood cells carry oxygen and are used for blood loss replacement.   White blood cells fight against infection.   Platelets control bleeding.   Plasma helps clot blood.   Other blood products are available for specialized needs, such as hemophilia or other clotting disorders.  BEFORE THE TRANSFUSION   Who gives blood for transfusions?    You may be able to donate blood to be used at a later date on yourself (autologous donation).   Relatives can be asked to donate blood. This is generally not any safer than if you have received blood from a stranger. The same precautions are taken to ensure safety when a relative's blood is donated.   Healthy volunteers who are fully evaluated to make sure their blood is safe. This is blood bank blood.  Transfusion therapy is the safest it has ever been in the practice of medicine. Before blood is taken from a donor, a complete history is taken to make sure that person has no history of diseases nor engages in risky social behavior (examples are intravenous drug use or sexual activity with multiple partners). The donor's travel history is screened to minimize risk of transmitting infections, such as malaria. The donated blood is tested for signs of infectious diseases, such as HIV and hepatitis. The blood is then tested to be sure it is compatible with you in order to minimize the chance of a transfusion reaction. If you or a relative donates blood, this is often done in anticipation of surgery and is not appropriate for emergency situations. It takes many days to process the donated blood.  RISKS AND COMPLICATIONS  Although transfusion therapy is very safe and saves many lives, the main dangers of transfusion include:    Getting an infectious disease.   Developing a  transfusion reaction. This is an allergic reaction to something in the blood you were given. Every precaution is taken to prevent this.  The decision to have a blood transfusion has been considered carefully by your caregiver before blood is given. Blood is not given unless the benefits outweigh the risks.  AFTER THE TRANSFUSION   Right after receiving a blood transfusion, you will usually feel much better and more energetic. This is especially true if your red blood cells have gotten low (anemic). The transfusion raises the level of the red blood cells which carry oxygen, and this usually causes an energy increase.   The nurse administering the transfusion will monitor you carefully for complications.  HOME CARE INSTRUCTIONS   No special instructions are needed after a transfusion. You may find your energy is better. Speak with your caregiver about any limitations on activity for underlying diseases you may have.  SEEK MEDICAL CARE IF:    Your condition is not improving after your transfusion.   You develop redness or irritation at the intravenous (IV) site.  SEEK IMMEDIATE MEDICAL CARE IF:   Any of the following symptoms occur over the next 12 hours:   Shaking chills.   You have a temperature by mouth above 102 F (38.9 C), not controlled by medicine.   Chest, back, or muscle pain.   People around you feel you are not acting correctly or are confused.   Shortness of breath or difficulty breathing.   Dizziness and fainting.     You get a rash or develop hives.   You have a decrease in urine output.   Your urine turns a dark color or changes to pink, red, or brown.  Any of the following symptoms occur over the next 10 days:   You have a temperature by mouth above 102 F (38.9 C), not controlled by medicine.   Shortness of breath.   Weakness after normal activity.   The white part of the eye turns yellow (jaundice).   You have a decrease in the amount of urine or are urinating less often.   Your  urine turns a dark color or changes to pink, red, or brown.  Document Released: 06/29/2000 Document Revised: 06/21/2011 Document Reviewed: 02/16/2008  ExitCare Patient Information 2012 [employer].

## 2012-02-21 ENCOUNTER — Other Ambulatory Visit: Payer: Self-pay | Admitting: *Deleted

## 2012-02-21 ENCOUNTER — Ambulatory Visit (HOSPITAL_BASED_OUTPATIENT_CLINIC_OR_DEPARTMENT_OTHER): Payer: Medicare Other | Admitting: Nurse Practitioner

## 2012-02-21 ENCOUNTER — Emergency Department (HOSPITAL_COMMUNITY)
Admission: EM | Admit: 2012-02-21 | Discharge: 2012-02-21 | Disposition: A | Discharge status: Home / Self Care | Payer: Medicare Other | Attending: Emergency Medicine | Admitting: Emergency Medicine

## 2012-02-21 ENCOUNTER — Emergency Department (HOSPITAL_COMMUNITY): Payer: Medicare Other

## 2012-02-21 ENCOUNTER — Encounter (HOSPITAL_COMMUNITY): Payer: Self-pay | Admitting: Emergency Medicine

## 2012-02-21 ENCOUNTER — Other Ambulatory Visit: Payer: Self-pay

## 2012-02-21 ENCOUNTER — Ambulatory Visit (HOSPITAL_BASED_OUTPATIENT_CLINIC_OR_DEPARTMENT_OTHER): Payer: Medicare Other

## 2012-02-21 VITALS — BP 124/57 | HR 79 | Temp 97.5°F | Resp 20 | Ht 63.0 in | Wt 177.1 lb

## 2012-02-21 DIAGNOSIS — D469 Myelodysplastic syndrome, unspecified: Secondary | ICD-10-CM

## 2012-02-21 DIAGNOSIS — R0609 Other forms of dyspnea: Secondary | ICD-10-CM | POA: Insufficient documentation

## 2012-02-21 DIAGNOSIS — R0602 Shortness of breath: Secondary | ICD-10-CM

## 2012-02-21 DIAGNOSIS — D649 Anemia, unspecified: Secondary | ICD-10-CM

## 2012-02-21 DIAGNOSIS — D47Z9 Other specified neoplasms of uncertain behavior of lymphoid, hematopoietic and related tissue: Secondary | ICD-10-CM

## 2012-02-21 DIAGNOSIS — R0989 Other specified symptoms and signs involving the circulatory and respiratory systems: Secondary | ICD-10-CM | POA: Insufficient documentation

## 2012-02-21 DIAGNOSIS — R0789 Other chest pain: Secondary | ICD-10-CM

## 2012-02-21 LAB — URINALYSIS, ROUTINE W REFLEX MICROSCOPIC
Bilirubin Urine: NEGATIVE
Hgb urine dipstick: NEGATIVE
Ketones, ur: NEGATIVE mg/dL
Nitrite: NEGATIVE
Protein, ur: NEGATIVE mg/dL
Specific Gravity, Urine: 1.016 (ref 1.005–1.030)
Urobilinogen, UA: 1 mg/dL (ref 0.0–1.0)

## 2012-02-21 LAB — COMPREHENSIVE METABOLIC PANEL
ALT: 21 U/L (ref 0–35)
CO2: 27 mEq/L (ref 19–32)
Calcium: 9.2 mg/dL (ref 8.4–10.5)
Creatinine, Ser: 0.61 mg/dL (ref 0.50–1.10)
GFR calc Af Amer: >90 mL/min (ref >90)
GFR calc non Af Amer: 88 mL/min — ABNORMAL LOW (ref >90)
Glucose, Bld: 91 mg/dL (ref 70–99)
Sodium: 137 mEq/L (ref 135–145)
Total Protein: 6.1 g/dL (ref 6.0–8.3)

## 2012-02-21 LAB — CBC WITH DIFFERENTIAL/PLATELET
BASO%: 0.5 % (ref 0.0–2.0)
Basophils Absolute: 0.1 10*3/uL (ref 0.0–0.1)
EOS%: 3.5 % (ref 0.0–7.0)
Eosinophils Absolute: 0.4 10*3/uL (ref 0.0–0.7)
HCT: 27.6 % — ABNORMAL LOW (ref 34.8–46.6)
LYMPH%: 24.8 % (ref 14.0–49.7)
Lymphocytes Relative: 25 % (ref 12–46)
MCH: 31.7 pg (ref 25.1–34.0)
MCH: 31.3 pg (ref 26.0–34.0)
MCHC: 32.6 g/dL (ref 31.5–36.0)
MCHC: 33.8 g/dL (ref 30.0–36.0)
MONO%: 6.6 % (ref 0.0–14.0)
Monocytes Absolute: 0.7 10*3/uL (ref 0.1–1.0)
NEUT%: 64.6 % (ref 38.4–76.8)
Neutro Abs: 7.9 10*3/uL — ABNORMAL HIGH (ref 1.7–7.7)
Neutrophils Relative %: 65 % (ref 43–77)
Platelets: 395 10*3/uL (ref 145–400)
RDW: 25.7 % — ABNORMAL HIGH (ref 11.5–15.5)

## 2012-02-21 LAB — TYPE AND SCREEN: Unit division: 0

## 2012-02-21 LAB — POCT I-STAT TROPONIN I

## 2012-02-21 LAB — PROTIME-INR
INR: 1.09 (ref 0.00–1.49)
Prothrombin Time: 14.3 seconds (ref 11.6–15.2)

## 2012-02-21 LAB — PRO B NATRIURETIC PEPTIDE: Pro B Natriuretic peptide (BNP): 277.6 pg/mL — ABNORMAL HIGH (ref 0–125)

## 2012-02-21 LAB — APTT: aPTT: 45 seconds — ABNORMAL HIGH (ref 24–37)

## 2012-02-21 NOTE — Progress Notes (Signed)
OFFICE PROGRESS NOTE  Interval history:  Carolyn Brennan is a 71 year old woman with a myeloproliferative disorder with dysplastic features. She did not respond to a trial of erythrocyte stimulating agents. She requires periodic blood transfusions.  Labs on 02/18/2012 returned with a hemoglobin of 6.7. She received one unit of blood on 02/18/2012 and a second unit on 02/20/2012.   In general Carolyn Brennan feels that "something is not right". When she woke up yesterday morning she was did not feel well.  She noted an abnormal sensation in her chest which has continued intermittently. She thinks the sensation may be exertion related but is not sure. She does not describe the sensation as pain or pressure. She considered going to the emergency department last night and again this morning but decided against this. She has not felt any better since the blood transfusion.   She typically experiences shortness of breath on exertion when her hemoglobin is low. Despite the recent blood transfusion she continues to note dyspnea on exertion. When taking deep breaths in the office today she reported pain at the right chest. She has not been nauseated or diaphoretic. She denies fever or cough. She denies leg swelling or calf pain. No recent travel or prolonged immobility. She is occasionally dizzy.   Objective: Blood pressure 124/57, pulse 79, temperature 97.5 F (36.4 C), temperature source Oral, resp. rate 20, height 5\' 3"  (1.6 m), weight 177 lb 1.6 oz (80.332 kg). oxygen saturation 96% on room air.  Oropharynx is without thrush or ulceration. Lungs are clear. No wheezes or rales. Regular cardiac rhythm. No murmur. Abdomen is soft and nontender. No organomegaly. Extremities are without edema. Calves are soft and nontender. She is alert and oriented. No acute distress.  Lab Results: Lab Results  Component Value Date   WBC 12.2* 02/21/2012   HGB 9.0* 02/21/2012   HCT 27.6* 02/21/2012   MCV 97.3 02/21/2012   PLT 395  02/21/2012    Chemistry:    Chemistry      Component Value Date/Time   NA 139 12/18/2011 1015   K 4.0 12/18/2011 1015   CL 107 12/18/2011 1015   CO2 20 12/18/2011 1015   BUN 14 12/18/2011 1015   CREATININE 0.64 12/18/2011 1015      Component Value Date/Time   CALCIUM 8.9 12/18/2011 1015   ALKPHOS 50 12/18/2011 1015   AST 14 12/18/2011 1015   ALT 15 12/18/2011 1015   BILITOT 2.4* 12/18/2011 1015       Studies/Results: No results found.  Medications: I have reviewed the patient's current medications.  Assessment/Plan:  1. Myeloproliferative disorder with dysplastic features. She requires periodic red cell transfusion support. She received one unit of blood on 02/18/2012 and a second on 02/20/2012. 2. Chest discomfort, dyspnea on exertion. Symptoms persist despite the recent blood transfusion.  Disposition-I discussed Carolyn Brennan's case with Dr. Cyndie Chime. We decided to refer her to the Emergency Department to rule out a cardiac etiology of her symptoms. She, and a friend accompanying her to today's visit, are in agreement.  Lonna Cobb ANP/GNP-BC

## 2012-02-21 NOTE — ED Provider Notes (Signed)
History     CSN: [id number]  Arrival date & time 02/21/12  1454   First MD Initiated Contact with Patient 02/21/12 1535      Chief Complaint  Patient presents with  . Chest Pain  . Shortness of Breath    (Consider location/radiation/quality/duration/timing/severity/associated sxs/prior treatment) HPI Pt referred from cancer center due to SOb with exertion, wheezing and questionable chest pain. Pt denied any chest pain to me. States she had mild R-side supraclavicular pain worse with deep inspiration and turning of her head. States she thinks its the angle she lays in bed while watching TV. SOB is minimal. No lower ext swelling or pain. No cough or fever. 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 Received 1 unit of PRBC's on 8/5 and another on 8/7.  Past Medical History  Diagnosis Date  . 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 Anxiety and depression 08/12/2011    History reviewed. No pertinent past surgical history.  History reviewed. No pertinent family history.  History  Substance Use Topics  . Smoking status: Not on file  . Smokeless tobacco: Not on file  . Alcohol Use: Not on file    OB History    Grav Para Term Preterm Abortions TAB SAB Ect Mult Living                  Review of Systems  Constitutional: Negative for fever and chills.  Respiratory: Positive for shortness of breath and wheezing. Negative for cough.   Cardiovascular: Negative for chest pain, palpitations and leg swelling.  Gastrointestinal: Negative for nausea, vomiting and abdominal pain.  Skin: Negative for rash and wound.  Neurological: Negative for weakness, numbness and headaches.    Allergies  Review of patient's allergies indicates no known allergies.  Home Medications   Current Outpatient Rx  Name Route Sig Dispense Refill  . ASPIRIN EC 81 MG PO TBEC Oral Take 81 mg by mouth daily.    [name] DIPHENHYDRAMINE HCL (SLEEP) 25 MG PO TABS Oral  Take 12.5 mg by mouth at bedtime as needed. For sleep.    [name] ERGOCALCIFEROL 50000 UNITS PO CAPS Oral Take 50,000 Units by mouth every 14 (fourteen) days. Taken on every other Monday.    [name] FLUOXETINE HCL 40 MG PO CAPS Oral Take 40 mg by mouth daily.      BP 139/63  Pulse 64  Temp 97.8 F (36.6 C) (Oral)  Resp 13  SpO2 98%  Physical Exam  Nursing note and vitals reviewed. Constitutional: She is oriented to person, place, and time. She appears well-developed and well-nourished. No distress.  HENT:  Head: Normocephalic and atraumatic.  Mouth/Throat: Oropharynx is clear and moist.  Eyes: EOM are normal. Pupils are equal, round, and reactive to light.  Neck: Normal range of motion. Neck supple. No JVD present.  Cardiovascular: Normal rate and regular rhythm.   Pulmonary/Chest: Effort normal and breath sounds normal. No respiratory distress. She has no wheezes. She has no rales. She exhibits no tenderness.  Abdominal: Soft. Bowel sounds are normal. She exhibits no distension and no mass. There is no tenderness. There is no rebound and no guarding.  Musculoskeletal: Normal range of motion. She exhibits no edema and no tenderness.       No calf tenderness or swelling  Neurological: She is alert and oriented to person, place, and time.  Skin: Skin is warm and dry. No rash noted. No erythema.  Psychiatric: She has a normal mood and affect. Her  behavior is normal.    ED Course  Procedures (including critical care time)  Labs Reviewed  CBC WITH DIFFERENTIAL - Abnormal; Notable for the following:    WBC 12.2 (*)     RBC 2.81 (*)     Hemoglobin 8.8 (*)     HCT 26.0 (*)     RDW 25.7 (*)     Platelets 468 (*)     Neutro Abs 7.9 (*)     All other components within normal limits  COMPREHENSIVE METABOLIC PANEL - Abnormal; Notable for the following:    Total Bilirubin 2.7 (*)     GFR calc non Af Amer 88 (*)     All other components within normal limits  PRO B NATRIURETIC PEPTIDE - Abnormal;  Notable for the following:    Pro B Natriuretic peptide (BNP) 277.6 (*)     All other components within normal limits  APTT - Abnormal; Notable for the following:    aPTT 45 (*)     All other components within normal limits  URINALYSIS, ROUTINE W REFLEX MICROSCOPIC - Abnormal; Notable for the following:    APPearance CLOUDY (*)     Leukocytes, UA TRACE (*)     All other components within normal limits  PROTIME-INR  POCT I-STAT TROPONIN I  URINE MICROSCOPIC-ADD ON   Dg Chest 2 View  02/21/2012  *RADIOLOGY REPORT*  Clinical Data: Shortness of breath, wheezing  CHEST - 2 VIEW  Comparison: 07/28/2010  Findings: Mild cardiomegaly noted without overt edema.  Lung fields are clear.  No pleural effusion.  No acute osseous abnormality.  IMPRESSION: No acute cardiopulmonary process.  Mild cardiomegaly again noted.  Original Report Authenticated By: Harrel Lemon, M.D.     1. Dyspnea on exertion      Date: 02/21/2012  Rate:   Rhythm: normal sinus rhythm  QRS Axis: normal  Intervals: normal  ST/T Wave abnormalities: normal  Conduction Disutrbances:none  Narrative Interpretation:   Old EKG Reviewed: unchanged    MDM   Pt is symptoms free. Suspect mild fluid overload from recent transfusions. F/u with PMD. Return for worsening symptoms       Loren Racer, MD 02/21/12 [phone]

## 2012-03-11 ENCOUNTER — Other Ambulatory Visit (HOSPITAL_BASED_OUTPATIENT_CLINIC_OR_DEPARTMENT_OTHER): Payer: Medicare Other | Admitting: Lab

## 2012-03-11 DIAGNOSIS — D469 Myelodysplastic syndrome, unspecified: Secondary | ICD-10-CM

## 2012-03-11 DIAGNOSIS — D47Z9 Other specified neoplasms of uncertain behavior of lymphoid, hematopoietic and related tissue: Secondary | ICD-10-CM

## 2012-03-11 LAB — CBC WITH DIFFERENTIAL/PLATELET
Basophils Absolute: 0 10*3/uL (ref 0.0–0.1)
Eosinophils Absolute: 0.4 10*3/uL (ref 0.0–0.5)
HCT: 24.8 % — ABNORMAL LOW (ref 34.8–46.6)
HGB: 8.1 g/dL — ABNORMAL LOW (ref 11.6–15.9)
LYMPH%: 26.3 % (ref 14.0–49.7)
MCV: 97.6 fL (ref 79.5–101.0)
MONO#: 1 10*3/uL — ABNORMAL HIGH (ref 0.1–0.9)
MONO%: 7.1 % (ref 0.0–14.0)
NEUT#: 8.9 10*3/uL — ABNORMAL HIGH (ref 1.5–6.5)
Platelets: 513 10*3/uL — ABNORMAL HIGH (ref 145–400)
RBC: 2.55 10*6/uL — ABNORMAL LOW (ref 3.70–5.45)
WBC: 14 10*3/uL — ABNORMAL HIGH (ref 3.9–10.3)

## 2012-03-11 LAB — MORPHOLOGY

## 2012-03-21 ENCOUNTER — Telehealth: Payer: Self-pay | Admitting: *Deleted

## 2012-03-21 ENCOUNTER — Other Ambulatory Visit: Payer: Self-pay | Admitting: *Deleted

## 2012-03-21 DIAGNOSIS — D469 Myelodysplastic syndrome, unspecified: Secondary | ICD-10-CM

## 2012-03-21 NOTE — Telephone Encounter (Signed)
Pt called requesting CBC for this mon.  She states she is resting & OK but maybe borderline & wants to check lab.  Order to scheduler to call her at home.

## 2012-03-21 NOTE — Telephone Encounter (Signed)
called pt and made appt for lab

## 2012-03-24 ENCOUNTER — Other Ambulatory Visit (HOSPITAL_BASED_OUTPATIENT_CLINIC_OR_DEPARTMENT_OTHER): Payer: Medicare Other | Admitting: Lab

## 2012-03-24 ENCOUNTER — Telehealth: Payer: Self-pay | Admitting: *Deleted

## 2012-03-24 DIAGNOSIS — D469 Myelodysplastic syndrome, unspecified: Secondary | ICD-10-CM

## 2012-03-24 DIAGNOSIS — D47Z9 Other specified neoplasms of uncertain behavior of lymphoid, hematopoietic and related tissue: Secondary | ICD-10-CM

## 2012-03-24 LAB — CBC WITH DIFFERENTIAL/PLATELET
BASO%: 0.6 % (ref 0.0–2.0)
EOS%: 3.7 % (ref 0.0–7.0)
HCT: 22.5 % — ABNORMAL LOW (ref 34.8–46.6)
LYMPH%: 30.3 % (ref 14.0–49.7)
MCH: 30.6 pg (ref 25.1–34.0)
MCHC: 32.9 g/dL (ref 31.5–36.0)
NEUT%: 58.7 % (ref 38.4–76.8)
Platelets: 540 10*3/uL — ABNORMAL HIGH (ref 145–400)
RBC: 2.42 10*6/uL — ABNORMAL LOW (ref 3.70–5.45)
nRBC: 1 % — ABNORMAL HIGH (ref 0–0)

## 2012-03-24 NOTE — Telephone Encounter (Signed)
Called patient.  Hgb is 7.4 Offered to transfuse tomorrow and Wednesday/ one unit each day.  Patient declined at this point but states she will keep her armband on in case she changes her mind. Let her know that we may not have availability again until Friday.  She said she would just take her chances because "someone might cancel".  Let our lab know and they will send type and hold and they will hold for 72hrs.

## 2012-03-25 ENCOUNTER — Encounter (HOSPITAL_COMMUNITY)
Admission: RE | Admit: 2012-03-25 | Discharge: 2012-03-25 | Disposition: A | Discharge status: Home / Self Care | Payer: Medicare Other | Source: Ambulatory Visit | Attending: Oncology | Admitting: Oncology

## 2012-03-25 ENCOUNTER — Telehealth: Payer: Self-pay | Admitting: Oncology

## 2012-03-25 ENCOUNTER — Other Ambulatory Visit: Payer: Self-pay | Admitting: *Deleted

## 2012-03-25 DIAGNOSIS — D649 Anemia, unspecified: Secondary | ICD-10-CM

## 2012-03-25 DIAGNOSIS — D469 Myelodysplastic syndrome, unspecified: Secondary | ICD-10-CM

## 2012-03-25 DIAGNOSIS — D47Z9 Other specified neoplasms of uncertain behavior of lymphoid, hematopoietic and related tissue: Secondary | ICD-10-CM | POA: Insufficient documentation

## 2012-03-25 NOTE — Telephone Encounter (Signed)
Pt called this morning and says she wants to keep her appt for lb/blood this Friday @ 8:30 am. Pt does have appts still on schedule and Marcelino Duster and desk nurse aware.

## 2012-03-26 ENCOUNTER — Other Ambulatory Visit: Payer: Self-pay | Admitting: *Deleted

## 2012-03-26 DIAGNOSIS — D649 Anemia, unspecified: Secondary | ICD-10-CM

## 2012-03-26 DIAGNOSIS — D469 Myelodysplastic syndrome, unspecified: Secondary | ICD-10-CM

## 2012-03-28 ENCOUNTER — Ambulatory Visit (HOSPITAL_BASED_OUTPATIENT_CLINIC_OR_DEPARTMENT_OTHER): Payer: Medicare Other

## 2012-03-28 ENCOUNTER — Telehealth: Payer: Self-pay | Admitting: *Deleted

## 2012-03-28 ENCOUNTER — Other Ambulatory Visit (HOSPITAL_BASED_OUTPATIENT_CLINIC_OR_DEPARTMENT_OTHER): Payer: Medicare Other | Admitting: Lab

## 2012-03-28 VITALS — BP 124/69 | HR 76 | Temp 98.1°F | Resp 20

## 2012-03-28 DIAGNOSIS — D649 Anemia, unspecified: Secondary | ICD-10-CM

## 2012-03-28 DIAGNOSIS — D469 Myelodysplastic syndrome, unspecified: Secondary | ICD-10-CM

## 2012-03-28 LAB — CBC WITH DIFFERENTIAL/PLATELET
Basophils Absolute: 0.1 10*3/uL (ref 0.0–0.1)
EOS%: 3.2 % (ref 0.0–7.0)
Eosinophils Absolute: 0.4 10*3/uL (ref 0.0–0.5)
HGB: 7.2 g/dL — ABNORMAL LOW (ref 11.6–15.9)
MCH: 33.1 pg (ref 25.1–34.0)
MONO#: 0.9 10*3/uL (ref 0.1–0.9)
NEUT#: 8.8 10*3/uL — ABNORMAL HIGH (ref 1.5–6.5)
RDW: 29 % — ABNORMAL HIGH (ref 11.2–14.5)
WBC: 13.1 10*3/uL — ABNORMAL HIGH (ref 3.9–10.3)
lymph#: 3 10*3/uL (ref 0.9–3.3)

## 2012-03-28 LAB — MORPHOLOGY

## 2012-03-28 MED ORDER — SODIUM CHLORIDE 0.9 % IV SOLN
250.0000 mL | Freq: Once | INTRAVENOUS | Status: AC
  Administered 2012-03-28: 250 mL via INTRAVENOUS

## 2012-03-28 MED ORDER — LORAZEPAM 2 MG/ML IJ SOLN
1.0000 mg | Freq: Once | INTRAMUSCULAR | Status: AC
  Administered 2012-03-28: 1 mg via INTRAVENOUS

## 2012-03-28 NOTE — Telephone Encounter (Signed)
Per POF I have canceled lab appt for 9/17 and moved to 9/23. Patient given calendar.  JMW

## 2012-03-28 NOTE — Patient Instructions (Signed)
Blood Products Information This is information about transfusions of blood products. All blood that is to be transfused is tested for blood type, compatibility with the recipient, and for infections. Except in emergencies, giving a transfusion requires a written consent. Blood transfusions are often given as packed red blood cells. This means the other parts of the blood have been taken out. Blood may be needed to treat severe anemia or bleeding. Other blood products include plasma, platelets, immune globulin, and cryoprecipitate. Blood for transfusion is mostly donated by volunteers. The blood donors are carefully screened for risk factors that could cause disease. Donors are all tested for infections that could be transmitted by blood. The blood product supply today is the safest it has ever been. Some risks do remain.  A minor reaction with fever, chills, or rash happens in about 1% of blood product transfusions.   Life-threatening reactions occur in less than 1 in a million transfusions.   Infection with germs (bacteria), viruses or parasites like malaria can still happen. The risk is very low.   Hepatitis B occurs in about 1 case in 150,000 transfusions.   Hepatitis C is seen once in 500,000.   HIV is transmitted less than once every million transfusions.  When you receive a transfusion of packed red blood cells, your blood is tested for blood group and Rh type. Your blood is also screened for antibodies that could cause a serious reaction. A cross-match test is done to make sure the blood is safe to give.  Talk with your caregiver if you have any concerns about receiving a transfusion of blood products. Make sure your questions are answered. Transfusions are not given if your caregiver feels the risk is greater than the need. Document Released: 07/02/2005 Document Revised: 06/21/2011 Document Reviewed: 12/20/2006 [employer] Patient Information 2012 [employer]. 

## 2012-03-30 LAB — TYPE AND SCREEN: Unit division: 0

## 2012-04-01 ENCOUNTER — Other Ambulatory Visit: Payer: Medicare Other | Admitting: Lab

## 2012-04-07 ENCOUNTER — Other Ambulatory Visit (HOSPITAL_BASED_OUTPATIENT_CLINIC_OR_DEPARTMENT_OTHER): Payer: Medicare Other | Admitting: Lab

## 2012-04-07 ENCOUNTER — Ambulatory Visit (HOSPITAL_BASED_OUTPATIENT_CLINIC_OR_DEPARTMENT_OTHER): Payer: Medicare Other | Admitting: Oncology

## 2012-04-07 VITALS — BP 139/74 | HR 91 | Temp 97.6°F | Resp 18 | Ht 63.0 in | Wt 175.4 lb

## 2012-04-07 DIAGNOSIS — D47Z9 Other specified neoplasms of uncertain behavior of lymphoid, hematopoietic and related tissue: Secondary | ICD-10-CM

## 2012-04-07 DIAGNOSIS — D469 Myelodysplastic syndrome, unspecified: Secondary | ICD-10-CM

## 2012-04-07 DIAGNOSIS — D649 Anemia, unspecified: Secondary | ICD-10-CM

## 2012-04-07 DIAGNOSIS — C946 Myelodysplastic disease, not classified: Secondary | ICD-10-CM

## 2012-04-07 DIAGNOSIS — F341 Dysthymic disorder: Secondary | ICD-10-CM

## 2012-04-07 LAB — CBC WITH DIFFERENTIAL/PLATELET
BASO%: 0.5 % (ref 0.0–2.0)
EOS%: 3.1 % (ref 0.0–7.0)
HCT: 26 % — ABNORMAL LOW (ref 34.8–46.6)
LYMPH%: 27.7 % (ref 14.0–49.7)
MCH: 31.4 pg (ref 25.1–34.0)
MCHC: 32.7 g/dL (ref 31.5–36.0)
NEUT%: 60.8 % (ref 38.4–76.8)
lymph#: 3.4 10*3/uL — ABNORMAL HIGH (ref 0.9–3.3)

## 2012-04-07 LAB — MORPHOLOGY: PLT EST: INCREASED

## 2012-04-07 NOTE — Progress Notes (Signed)
Hematology and Oncology Follow Up Visit  Carolyn Brennan [id number] January 05, 1941 71 y.o. 04/07/2012 7:43 PM   Principle Diagnosis: Encounter Diagnosis  Name Primary?  [name] MDS/MPN (myelodysplastic/myeloproliferative neoplasms) Yes     Interim History:   Followup visit for this 71 year old woman with a myeloproliferative disorder with dysplastic features. Blood counts as far back as July 2007 showed a macrocytic anemia with hemoglobin 11 g, leukocytosis with white count 13,600, and mild elevation of her platelet count of 399,000. She was referred to our office in April 2008. Initial bone marrow done by my partner Dr. Myna Hidalgo showed a hypercellular marrow with ring sideroblasts. Routine cytogenetics were normal. FISH studies not done on the marrow. She tested negative for the Jack-2 mutation. Most recent bone marrow done 01/25/2011 shows normal cytogenetics. No evidence for the 5Q deletion on FISH. This marrow changed little compared with the initial study done at diagnosis in April of 2008. Marrow hypercellular for age 35% with prominent erythroid proliferation, megakaryocyte proliferation with the large and abnormal forms. No excess blasts. Normal maturation in the myeloid series. dysplastic changes in the erythroid series.  She had no convincing response to erythrocyte stimulating agents. She did not tolerate Aranesp. She tolerated Epogen but had no response despite dose increases.  She is now requiring periodic blood transfusions. Hemoglobin has fallen as low as 6.4 g. (07/16/2011).  Main symptom is fatigue which is significantly limits her daily activities.  We spent our usual 45 minutes addressing her concerns about life and death. She brought in a copy of her living will.  She is adamant that she would not want to be resuscitated in the event of an irreversible deterioration in her condition.  She asked if she is a candidate for bone marrow transplant. I told her before and I told her again this  is totally out of the question. She would require an allogeneic transplant which still carries over 50% mortality rate in the first year in somebody her age.  We talked again about going on a trial of Revlimid which would have on approximate 30% chance of improving her hemoglobin. She is still apprehensive about this and still does not want to try it.  We spent a long time talking about iron overload and its consequences-arrhythmias and/or heart failure. We discussed the use of chelating agents such as Exjade and I gave her printed information from the up-to-date database to review. We discussed potential side effects the most serious of which would be renal dysfunction or problems with vision. If she decides to go on the drug, she will need a baseline slit lamp examination by an ophthalmologist. She is due to see Dr. Emily Filbert soon for a routine followup.  She has had no more episodes of atypical chest pain. She attributes the previous episode to increased salt intake.  Hemoglobin fell down to 7.2 on September 13 and we gave her 2 units of blood. Hemoglobin is 8.5 today and she feels good.    Medications: reviewed  Allergies: No Known Allergies  Review of Systems: Constitutional:   See above Respiratory: No cough or dyspnea Cardiovascular: No chest pain or palpitations  Gastrointestinal: No abdominal pain Genito-Urinary: Not questioned Musculoskeletal: No muscle or bone pain Neurologic: No headache or change in vision Skin: No rash Remaining ROS negative.  Physical Exam: Blood pressure 139/74, pulse 91, temperature 97.6 F (36.4 C), temperature source Oral, resp. rate 18, height 5\' 3"  (1.6 m), weight 175 lb 6.4 oz (79.561 kg). Wt Readings from Last  3 Encounters:  04/07/12 175 lb 6.4 oz (79.561 kg)  02/21/12 177 lb 1.6 oz (80.332 kg)  11/27/11 174 lb 11.2 oz (79.243 kg)     General appearance: Well-nourished Caucasian woman HENNT: Pharynx no erythema or exudate Lymph nodes: No  adenopathy Breasts: Not examined Lungs: Clear to auscultation resonant to percussion Heart: Regular rhythm no murmur no gallop Abdomen: Soft nontender, no mass, no organomegaly Extremities: No edema, no calf tenderness Vascular: No cyanosis Neurologic: Motor strength 5 over 5, reflexes 1+ symmetric Skin: No rash or ecchymosis  Lab Results: Lab Results  Component Value Date   WBC 12.1* 04/07/2012   HGB 8.5* 04/07/2012   HCT 26.0* 04/07/2012   MCV 96.1 04/07/2012   PLT 454* 04/07/2012     Chemistry      Component Value Date/Time   NA 137 02/21/2012 1608   K 4.1 02/21/2012 1608   CL 102 02/21/2012 1608   CO2 27 02/21/2012 1608   BUN 13 02/21/2012 1608   CREATININE 0.61 02/21/2012 1608      Component Value Date/Time   CALCIUM 9.2 02/21/2012 1608   ALKPHOS 59 02/21/2012 1608   AST 22 02/21/2012 1608   ALT 21 02/21/2012 1608   BILITOT 2.7* 02/21/2012 1608       Impression and Plan: #1. Myeloproliferative disorder with dysplastic features. Plan continue periodic blood monitoring and transfusions as needed. She will review the information I gave her about Exjade but I doubt that she will actually take the drug given her difficulty in coming to a decision.  #2. Chronic anxiety and depression    CC:. Dr. Merlene Laughter; Dr. Leda Quail; Dr Elise Benne   Levert Feinstein, MD 9/23/20137:43 PM

## 2012-04-08 ENCOUNTER — Telehealth: Payer: Self-pay | Admitting: Oncology

## 2012-04-08 NOTE — Telephone Encounter (Signed)
Gave pt appt for labs every 3 weeks and see MD in January 2014 with labs

## 2012-04-16 ENCOUNTER — Ambulatory Visit (INDEPENDENT_AMBULATORY_CARE_PROVIDER_SITE_OTHER): Payer: Medicare Other | Admitting: Family Medicine

## 2012-04-16 ENCOUNTER — Ambulatory Visit: Payer: Medicare Other

## 2012-04-16 VITALS — BP 133/74 | HR 76 | Temp 98.2°F | Resp 16 | Ht 62.0 in | Wt 174.8 lb

## 2012-04-16 DIAGNOSIS — R0789 Other chest pain: Secondary | ICD-10-CM

## 2012-04-16 DIAGNOSIS — D649 Anemia, unspecified: Secondary | ICD-10-CM

## 2012-04-16 DIAGNOSIS — D469 Myelodysplastic syndrome, unspecified: Secondary | ICD-10-CM

## 2012-04-16 LAB — POCT CBC
Granulocyte percent: 58 %G (ref 37–80)
HCT, POC: 27.2 % — AB (ref 37.7–47.9)
Hemoglobin: 8.4 g/dL — AB (ref 12.2–16.2)
Lymph, poc: 5.8 — AB (ref 0.6–3.4)
MCH, POC: 30 pg (ref 27–31.2)
MCHC: 30.9 g/dL — AB (ref 31.8–35.4)
MCV: 97.1 fL — AB (ref 80–97)
MID (cbc): 1.1 — AB (ref 0–0.9)
MPV: 10 fL (ref 0–99.8)
POC Granulocyte: 9.6 — AB (ref 2–6.9)
POC LYMPH PERCENT: 35.1 %L (ref 10–50)
POC MID %: 6.9 %M (ref 0–12)
Platelet Count, POC: 580 10*3/uL — AB (ref 142–424)
RBC: 2.8 M/uL — AB (ref 4.04–5.48)
RDW, POC: 23.3 %
WBC: 16.6 10*3/uL — AB (ref 4.6–10.2)

## 2012-04-16 MED ORDER — GI COCKTAIL ~~LOC~~
30.0000 mL | Freq: Once | ORAL | Status: AC
  Administered 2012-04-16: 30 mL via ORAL

## 2012-04-16 MED ORDER — SUCRALFATE 1 GM/10ML PO SUSP: ORAL | Status: DC

## 2012-04-16 NOTE — Progress Notes (Signed)
This is a 71 year old woman with myelodysplasia comes in with chest discomfort for the last 3 hours. She says she's had some shortness of breath but this has not appreciably different from the way she normally feels. She's had no nausea, leg pains. She has had some mild dizziness but this is also a symptom she has periodically.  Patient had this kind of chest pain before and was directed to the emergency room at which time she was told that this was not related to her heart.  Patient states that the pain began when she was lying down started in the right chest and then migrated to the left anterior chest. The pain is very mild on a scale of 10, perhaps 2 or 3. He states that the pain is worse when she lies down flat it improves when she sits up.  In the past month patient is received a transfusion for hemoglobin 6.5. The most recent hemoglobin was on 9/23 at which time she had a hemoglobin of 8.5.  Objective: Patient was brought back urgently because of her chest pain. She's in no acute distress. Patient appears to be overweight but has reasonable color.  HEENT is unremarkable Neck: No JVD or thyromegaly, neck is supple Chest: clear Heart:  Regular, no murmur or gallop or rub Abdomen: soft, nontender Extremities: no edema EKG: no acute changes [facility] reading (PRIMARY) by  Dr. Milus Glazier:  NAD Results for orders placed in visit on 04/16/12  POCT CBC      Component Value Range   WBC 16.6 (*) 4.6 - 10.2 K/uL   Lymph, poc 5.8 (*) 0.6 - 3.4   POC LYMPH PERCENT 35.1  10 - 50 %L   MID (cbc) 1.1 (*) 0 - 0.9   POC MID % 6.9  0 - 12 %M   POC Granulocyte 9.6 (*) 2 - 6.9   Granulocyte percent 58.0  37 - 80 %G   RBC 2.80 (*) 4.04 - 5.48 M/uL   Hemoglobin 8.4 (*) 12.2 - 16.2 g/dL   HCT, POC 60.4 (*) 54.0 - 47.9 %   MCV 97.1 (*) 80 - 97 fL   MCH, POC 30.0  27 - 31.2 pg   MCHC 30.9 (*) 31.8 - 35.4 g/dL   RDW, POC 98.1     Platelet Count, POC 580 (*) 142 - 424 K/uL   MPV 10.0  0 - 99.8 fL    . Some improvement after GI cocktail   Assessment: GERD  Plan: 1. Chest pressure  EKG 12-Lead, DG Chest 2 View, gi cocktail (Maalox,Lidocaine,Donnatal), sucralfate (CARAFATE) 1 GM/10ML suspension  2. Myeloid dysplasia  POCT CBC  3. Anemia  POCT CBC

## 2012-04-16 NOTE — Patient Instructions (Addendum)
This is a 71 year old woman with myelodysplasia comes in with chest discomfort for the last 3 hours. She says she's had some shortness of breath but this has not appreciably different from the way she normally feels. She's had no nausea, leg pains. She has had some mild dizziness but this is also a symptom she has periodically.  Patient had this kind of chest pain before and was directed to the emergency room at which time she was told that this was not related to her heart.  Patient states that the pain began when she was lying down started in the right chest and then migrated to the left anterior chest. The pain is very mild on a scale of 10, perhaps 2 or 3. He states that the pain is worse when she lies down flat it improves when she sits up.  In the past month patient is received a transfusion for hemoglobin 6.5. The most recent hemoglobin was on 9/23 at which time she had a hemoglobin of 8.5.  Objective: Patient was brought back urgently because of her chest pain. She's in no acute distress. Patient appears to be overweight but has reasonable color.  HEENT is unremarkable Neck: No JVD or thyromegaly, neck is supple Chest: clear Heart:  Regular, no murmur or gallop or rub Abdomen: soft, nontender Extremities: no edema EKG: no acute changes [facility] reading (PRIMARY) by  Dr. Milus Glazier:  NAD Results for orders placed in visit on 04/16/12  POCT CBC      Component Value Range   WBC 16.6 (*) 4.6 - 10.2 K/uL   Lymph, poc 5.8 (*) 0.6 - 3.4   POC LYMPH PERCENT 35.1  10 - 50 %L   MID (cbc) 1.1 (*) 0 - 0.9   POC MID % 6.9  0 - 12 %M   POC Granulocyte 9.6 (*) 2 - 6.9   Granulocyte percent 58.0  37 - 80 %G   RBC 2.80 (*) 4.04 - 5.48 M/uL   Hemoglobin 8.4 (*) 12.2 - 16.2 g/dL   HCT, POC 08.6 (*) 57.8 - 47.9 %   MCV 97.1 (*) 80 - 97 fL   MCH, POC 30.0  27 - 31.2 pg   MCHC 30.9 (*) 31.8 - 35.4 g/dL   RDW, POC 46.9     Platelet Count, POC 580 (*) 142 - 424 K/uL   MPV 10.0  0 - 99.8 fL    . Some improvement after GI cocktail   Assessment: GERD  Plan: 1. Chest pressure  EKG 12-Lead, DG Chest 2 View, gi cocktail (Maalox,Lidocaine,Donnatal), sucralfate (CARAFATE) 1 GM/10ML suspension  2. Myeloid dysplasia  POCT CBC  3. Anemia  POCT CBC      Gastroesophageal Reflux Disease, Adult Gastroesophageal reflux disease (GERD) happens when acid from your stomach flows up into the esophagus. When acid comes in contact with the esophagus, the acid causes soreness (inflammation) in the esophagus. Over time, GERD may create small holes (ulcers) in the lining of the esophagus. CAUSES   Increased body weight. This puts pressure on the stomach, making acid rise from the stomach into the esophagus.  Smoking. This increases acid production in the stomach.  Drinking alcohol. This causes decreased pressure in the lower esophageal sphincter (valve or ring of muscle between the esophagus and stomach), allowing acid from the stomach into the esophagus.  Late evening meals and a full stomach. This increases pressure and acid production in the stomach.  A malformed lower esophageal sphincter. Sometimes, no cause is found.  SYMPTOMS   Burning pain in the lower part of the mid-chest behind the breastbone and in the mid-stomach area. This may occur twice a week or more often.  Trouble swallowing.  Sore throat.  Dry cough.  Asthma-like symptoms including chest tightness, shortness of breath, or wheezing. DIAGNOSIS  Your caregiver may be able to diagnose GERD based on your symptoms. In some cases, X-rays and other tests may be done to check for complications or to check the condition of your stomach and esophagus. TREATMENT  Your caregiver may recommend over-the-counter or prescription medicines to help decrease acid production. Ask your caregiver before starting or adding any new medicines.  HOME CARE INSTRUCTIONS   Change the factors that you can control. Ask your caregiver for  guidance concerning weight loss, quitting smoking, and alcohol consumption.  Avoid foods and drinks that make your symptoms worse, such as:  Caffeine or alcoholic drinks.  Chocolate.  Peppermint or mint flavorings.  Garlic and onions.  Spicy foods.  Citrus fruits, such as oranges, lemons, or limes.  Tomato-based foods such as sauce, chili, salsa, and pizza.  Fried and fatty foods.  Avoid lying down for the 3 hours prior to your bedtime or prior to taking a nap.  Eat small, frequent meals instead of large meals.  Wear loose-fitting clothing. Do not wear anything tight around your waist that causes pressure on your stomach.  Raise the head of your bed 6 to 8 inches with wood blocks to help you sleep. Extra pillows will not help.  Only take over-the-counter or prescription medicines for pain, discomfort, or fever as directed by your caregiver.  Do not take aspirin, ibuprofen, or other nonsteroidal anti-inflammatory drugs (NSAIDs). SEEK IMMEDIATE MEDICAL CARE IF:   You have pain in your arms, neck, jaw, teeth, or back.  Your pain increases or changes in intensity or duration.  You develop nausea, vomiting, or sweating (diaphoresis).  You develop shortness of breath, or you faint.  Your vomit is green, yellow, black, or looks like coffee grounds or blood.  Your stool is red, bloody, or black. These symptoms could be signs of other problems, such as heart disease, gastric bleeding, or esophageal bleeding. MAKE SURE YOU:   Understand these instructions.  Will watch your condition.  Will get help right away if you are not doing well or get worse. Document Released: 04/11/2005 Document Revised: 09/24/2011 Document Reviewed: 01/19/2011 [facility] Patient Information 2013 [place], [place].

## 2012-04-28 ENCOUNTER — Other Ambulatory Visit (HOSPITAL_BASED_OUTPATIENT_CLINIC_OR_DEPARTMENT_OTHER): Payer: Medicare Other | Admitting: Lab

## 2012-04-28 DIAGNOSIS — D469 Myelodysplastic syndrome, unspecified: Secondary | ICD-10-CM

## 2012-04-28 DIAGNOSIS — D47Z9 Other specified neoplasms of uncertain behavior of lymphoid, hematopoietic and related tissue: Secondary | ICD-10-CM

## 2012-04-28 LAB — CBC & DIFF AND RETIC
BASO%: 0.7 % (ref 0.0–2.0)
EOS%: 2.7 % (ref 0.0–7.0)
HGB: 7.8 g/dL — ABNORMAL LOW (ref 11.6–15.9)
Immature Retic Fract: 13.1 % — ABNORMAL HIGH (ref 1.60–10.00)
MCH: 30.5 pg (ref 25.1–34.0)
MCHC: 32.2 g/dL (ref 31.5–36.0)
RDW: 26.5 % — ABNORMAL HIGH (ref 11.2–14.5)
Retic %: 2.49 % — ABNORMAL HIGH (ref 0.70–2.10)
Retic Ct Abs: 63.74 10*3/uL (ref 33.70–90.70)
lymph#: 3.6 10*3/uL — ABNORMAL HIGH (ref 0.9–3.3)

## 2012-04-28 LAB — MORPHOLOGY: PLT EST: INCREASED

## 2012-04-29 ENCOUNTER — Telehealth: Payer: Self-pay | Admitting: *Deleted

## 2012-04-29 NOTE — Telephone Encounter (Signed)
Called and spoke with pt regarding drop in Hgb from 8.5 to 04/28/12 Hgb 7.8.  Pt states "I feel good, worked out this am and have had a great lunch; no SOB" Pt stated she would let office know if any changes.

## 2012-05-13 ENCOUNTER — Telehealth: Payer: Self-pay | Admitting: *Deleted

## 2012-05-13 NOTE — Telephone Encounter (Signed)
Patient called.  She is feeling good, however she is having some breathing issues with nasal congestion and she thinks its allergies, but a friend told her it could be her blood.  She denies cough and she does not feel like she has sinus drainage, but she does have nasal congestion.  She is going to try something her ENT gave her - gelo nasal and she will let us know how she is doing.  Let her know that there was nothing magical about waiting till 11/4 for her blood work and we will be glad to do it this week if she wants.  She wants to wait and see how she does, but she knows she can call and come in earlier if she wants.  She appreciated the flexibility and will call if needed.

## 2012-05-14 ENCOUNTER — Other Ambulatory Visit (HOSPITAL_BASED_OUTPATIENT_CLINIC_OR_DEPARTMENT_OTHER): Payer: Medicare Other | Admitting: Lab

## 2012-05-14 ENCOUNTER — Encounter (HOSPITAL_COMMUNITY)
Admission: RE | Admit: 2012-05-14 | Discharge: 2012-05-14 | Disposition: A | Discharge status: Home / Self Care | Payer: Medicare Other | Source: Ambulatory Visit | Attending: Oncology | Admitting: Oncology

## 2012-05-14 ENCOUNTER — Other Ambulatory Visit: Payer: Self-pay | Admitting: *Deleted

## 2012-05-14 ENCOUNTER — Ambulatory Visit (HOSPITAL_BASED_OUTPATIENT_CLINIC_OR_DEPARTMENT_OTHER): Payer: Medicare Other

## 2012-05-14 ENCOUNTER — Other Ambulatory Visit: Payer: Self-pay | Admitting: Nurse Practitioner

## 2012-05-14 VITALS — BP 123/43 | HR 73 | Temp 98.7°F | Resp 16

## 2012-05-14 DIAGNOSIS — D469 Myelodysplastic syndrome, unspecified: Secondary | ICD-10-CM

## 2012-05-14 DIAGNOSIS — D47Z9 Other specified neoplasms of uncertain behavior of lymphoid, hematopoietic and related tissue: Secondary | ICD-10-CM

## 2012-05-14 DIAGNOSIS — D649 Anemia, unspecified: Secondary | ICD-10-CM

## 2012-05-14 DIAGNOSIS — F329 Major depressive disorder, single episode, unspecified: Secondary | ICD-10-CM

## 2012-05-14 DIAGNOSIS — F411 Generalized anxiety disorder: Secondary | ICD-10-CM

## 2012-05-14 LAB — CBC WITH DIFFERENTIAL/PLATELET
BASO%: 0.5 % (ref 0.0–2.0)
EOS%: 2.5 % (ref 0.0–7.0)
MCH: 31 pg (ref 25.1–34.0)
MCHC: 32.4 g/dL (ref 31.5–36.0)
NEUT%: 67.5 % (ref 38.4–76.8)
RDW: 28.5 % — ABNORMAL HIGH (ref 11.2–14.5)
lymph#: 2.6 10*3/uL (ref 0.9–3.3)

## 2012-05-14 LAB — HOLD TUBE, BLOOD BANK

## 2012-05-14 MED ORDER — LORAZEPAM 2 MG/ML IJ SOLN
1.0000 mg | Freq: Once | INTRAMUSCULAR | Status: AC
  Administered 2012-05-14: 1 mg via INTRAVENOUS

## 2012-05-14 MED ORDER — SODIUM CHLORIDE 0.9 % IV SOLN
250.0000 mL | Freq: Once | INTRAVENOUS | Status: AC
  Administered 2012-05-14: 250 mL via INTRAVENOUS

## 2012-05-14 NOTE — Patient Instructions (Addendum)
Blood Transfusion Information WHAT IS A BLOOD TRANSFUSION? A transfusion is the replacement of blood or some of its parts. Blood is made up of multiple cells which provide different functions.  Red blood cells carry oxygen and are used for blood loss replacement.  White blood cells fight against infection.  Platelets control bleeding.  Plasma helps clot blood.  Other blood products are available for specialized needs, such as hemophilia or other clotting disorders. BEFORE THE TRANSFUSION  Who gives blood for transfusions?   You may be able to donate blood to be used at a later date on yourself (autologous donation).  Relatives can be asked to donate blood. This is generally not any safer than if you have received blood from a stranger. The same precautions are taken to ensure safety when a relative's blood is donated.  Healthy volunteers who are fully evaluated to make sure their blood is safe. This is blood bank blood. Transfusion therapy is the safest it has ever been in the practice of medicine. Before blood is taken from a donor, a complete history is taken to make sure that person has no history of diseases nor engages in risky social behavior (examples are intravenous drug use or sexual activity with multiple partners). The donor's travel history is screened to minimize risk of transmitting infections, such as malaria. The donated blood is tested for signs of infectious diseases, such as HIV and hepatitis. The blood is then tested to be sure it is compatible with you in order to minimize the chance of a transfusion reaction. If you or a relative donates blood, this is often done in anticipation of surgery and is not appropriate for emergency situations. It takes many days to process the donated blood. RISKS AND COMPLICATIONS Although transfusion therapy is very safe and saves many lives, the main dangers of transfusion include:   Getting an infectious disease.  Developing a  transfusion reaction. This is an allergic reaction to something in the blood you were given. Every precaution is taken to prevent this. The decision to have a blood transfusion has been considered carefully by your caregiver before blood is given. Blood is not given unless the benefits outweigh the risks. AFTER THE TRANSFUSION  Right after receiving a blood transfusion, you will usually feel much better and more energetic. This is especially true if your red blood cells have gotten low (anemic). The transfusion raises the level of the red blood cells which carry oxygen, and this usually causes an energy increase.  The nurse administering the transfusion will monitor you carefully for complications. HOME CARE INSTRUCTIONS  No special instructions are needed after a transfusion. You may find your energy is better. Speak with your caregiver about any limitations on activity for underlying diseases you may have. SEEK MEDICAL CARE IF:   Your condition is not improving after your transfusion.  You develop redness or irritation at the intravenous (IV) site. SEEK IMMEDIATE MEDICAL CARE IF:  Any of the following symptoms occur over the next 12 hours:  Shaking chills.  You have a temperature by mouth above 102 F (38.9 C), not controlled by medicine.  Chest, back, or muscle pain.  People around you feel you are not acting correctly or are confused.  Shortness of breath or difficulty breathing.  Dizziness and fainting.  You get a rash or develop hives.  You have a decrease in urine output.  Your urine turns a dark color or changes to pink, red, or brown. Any of the following   symptoms occur over the next 10 days:  You have a temperature by mouth above 102 F (38.9 C), not controlled by medicine.  Shortness of breath.  Weakness after normal activity.  The white part of the eye turns yellow (jaundice).  You have a decrease in the amount of urine or are urinating less often.  Your  urine turns a dark color or changes to pink, red, or brown. Document Released: 06/29/2000 Document Revised: 09/24/2011 Document Reviewed: 02/16/2008 [employer] Patient Information 2013 [employer].  

## 2012-05-15 ENCOUNTER — Telehealth: Payer: Self-pay | Admitting: Oncology

## 2012-05-15 LAB — TYPE AND SCREEN
ABO/RH(D): A POS
Antibody Screen: NEGATIVE
Unit division: 0
Unit division: 0

## 2012-05-15 NOTE — Telephone Encounter (Signed)
Pt called wants lab appt for 11/4th cancelled , nurse notified

## 2012-05-19 ENCOUNTER — Other Ambulatory Visit: Payer: Medicare Other | Admitting: Lab

## 2012-06-03 ENCOUNTER — Ambulatory Visit (HOSPITAL_BASED_OUTPATIENT_CLINIC_OR_DEPARTMENT_OTHER): Payer: Medicare Other | Admitting: Lab

## 2012-06-03 ENCOUNTER — Telehealth: Payer: Self-pay | Admitting: *Deleted

## 2012-06-03 ENCOUNTER — Other Ambulatory Visit: Payer: Self-pay | Admitting: *Deleted

## 2012-06-03 DIAGNOSIS — D47Z9 Other specified neoplasms of uncertain behavior of lymphoid, hematopoietic and related tissue: Secondary | ICD-10-CM

## 2012-06-03 DIAGNOSIS — D469 Myelodysplastic syndrome, unspecified: Secondary | ICD-10-CM

## 2012-06-03 LAB — COMPREHENSIVE METABOLIC PANEL (CC13)
ALT: 34 U/L (ref 0–55)
AST: 27 U/L (ref 5–34)
Alkaline Phosphatase: 74 U/L (ref 40–150)
Creatinine: 0.7 mg/dL (ref 0.6–1.1)
Sodium: 138 mEq/L (ref 136–145)
Total Bilirubin: 2.74 mg/dL — ABNORMAL HIGH (ref 0.20–1.20)

## 2012-06-03 LAB — CBC & DIFF AND RETIC
Basophils Absolute: 0.1 10*3/uL (ref 0.0–0.1)
Eosinophils Absolute: 0.5 10*3/uL (ref 0.0–0.5)
LYMPH%: 31.3 % (ref 14.0–49.7)
MCH: 30.7 pg (ref 25.1–34.0)
MCV: 94.5 fL (ref 79.5–101.0)
MONO%: 5.8 % (ref 0.0–14.0)
NEUT#: 7 10*3/uL — ABNORMAL HIGH (ref 1.5–6.5)
Platelets: 499 10*3/uL — ABNORMAL HIGH (ref 145–400)
RBC: 2.54 10*6/uL — ABNORMAL LOW (ref 3.70–5.45)
Retic %: 2.46 % — ABNORMAL HIGH (ref 0.70–2.10)

## 2012-06-03 LAB — MORPHOLOGY

## 2012-06-03 LAB — FERRITIN: Ferritin: 1584 ng/mL — ABNORMAL HIGH (ref 10–291)

## 2012-06-03 NOTE — Telephone Encounter (Signed)
Received call this am from pt stating that she was transfused 3 wks ago but feels the need to come in today for a cbc.   Returned call & pt reports SOB but states she has been doing a lot & doesn't know if this is the problem or not.  Scheduled for walk-in appt today @ 11am.

## 2012-06-06 ENCOUNTER — Telehealth: Payer: Self-pay | Admitting: Oncology

## 2012-06-06 ENCOUNTER — Other Ambulatory Visit: Payer: Self-pay | Admitting: *Deleted

## 2012-06-06 DIAGNOSIS — D649 Anemia, unspecified: Secondary | ICD-10-CM

## 2012-06-06 DIAGNOSIS — D469 Myelodysplastic syndrome, unspecified: Secondary | ICD-10-CM

## 2012-06-06 NOTE — Telephone Encounter (Signed)
Per myrtle call the pt and move her lab to earlier in the day just in case she needs blood,pt aware    anne

## 2012-06-09 ENCOUNTER — Other Ambulatory Visit (HOSPITAL_BASED_OUTPATIENT_CLINIC_OR_DEPARTMENT_OTHER): Payer: Medicare Other | Admitting: Lab

## 2012-06-09 ENCOUNTER — Telehealth: Payer: Self-pay | Admitting: *Deleted

## 2012-06-09 ENCOUNTER — Other Ambulatory Visit: Payer: Medicare Other | Admitting: Lab

## 2012-06-09 DIAGNOSIS — D469 Myelodysplastic syndrome, unspecified: Secondary | ICD-10-CM

## 2012-06-09 DIAGNOSIS — D47Z9 Other specified neoplasms of uncertain behavior of lymphoid, hematopoietic and related tissue: Secondary | ICD-10-CM

## 2012-06-09 DIAGNOSIS — D649 Anemia, unspecified: Secondary | ICD-10-CM

## 2012-06-09 LAB — MORPHOLOGY

## 2012-06-09 LAB — CBC & DIFF AND RETIC
Basophils Absolute: 0.1 10*3/uL (ref 0.0–0.1)
Eosinophils Absolute: 0.5 10*3/uL (ref 0.0–0.5)
HGB: 7.5 g/dL — ABNORMAL LOW (ref 11.6–15.9)
LYMPH%: 24.3 % (ref 14.0–49.7)
MCV: 94.3 fL (ref 79.5–101.0)
MONO%: 6.9 % (ref 0.0–14.0)
NEUT#: 8.3 10*3/uL — ABNORMAL HIGH (ref 1.5–6.5)
Platelets: 527 10*3/uL — ABNORMAL HIGH (ref 145–400)

## 2012-06-09 NOTE — Telephone Encounter (Signed)
Called and spoke with pt regarding Hgb of 7.5 today.  Per pt; "feels great, slightly SOB but I don't want a transfusion at this point.  My average is 7.6, so I'm good."  Instructed pt to call back if any problems.  Pt verbalized understanding and confirmed next lab 06/30/12.

## 2012-06-23 ENCOUNTER — Telehealth: Payer: Self-pay | Admitting: *Deleted

## 2012-06-23 NOTE — Telephone Encounter (Signed)
Called and spoke with pt regarding her message this am.  Pt states that Carolyn Brennan "wants to remove these basal cell carcinoma on my forehead and I don't want to do it"  States "its low grade" and she wants Carolyn Brennan to "say she doesn't need it"  Surgery is scheduled for 07/13/12; next appt with Carolyn Brennan is 08/11/12.  Explained that Carolyn Brennan is out of town and will be made aware of this and office will call back later this week with MD recommendation.  Pt verbalized understanding and expressed appreciation for call back.

## 2012-06-30 ENCOUNTER — Other Ambulatory Visit: Payer: Self-pay | Admitting: *Deleted

## 2012-06-30 ENCOUNTER — Encounter (HOSPITAL_COMMUNITY)
Admission: RE | Admit: 2012-06-30 | Discharge: 2012-06-30 | Disposition: A | Discharge status: Home / Self Care | Payer: Medicare Other | Source: Ambulatory Visit | Attending: Oncology | Admitting: Oncology

## 2012-06-30 ENCOUNTER — Other Ambulatory Visit (HOSPITAL_BASED_OUTPATIENT_CLINIC_OR_DEPARTMENT_OTHER): Payer: Medicare Other | Admitting: Lab

## 2012-06-30 DIAGNOSIS — D649 Anemia, unspecified: Secondary | ICD-10-CM | POA: Insufficient documentation

## 2012-06-30 DIAGNOSIS — D469 Myelodysplastic syndrome, unspecified: Secondary | ICD-10-CM

## 2012-06-30 DIAGNOSIS — D47Z9 Other specified neoplasms of uncertain behavior of lymphoid, hematopoietic and related tissue: Secondary | ICD-10-CM

## 2012-06-30 LAB — CBC & DIFF AND RETIC
BASO%: 0.4 % (ref 0.0–2.0)
Basophils Absolute: 0.1 10*3/uL (ref 0.0–0.1)
EOS%: 2.6 % (ref 0.0–7.0)
HCT: 19.1 % — ABNORMAL LOW (ref 34.8–46.6)
HGB: 6.1 g/dL — CL (ref 11.6–15.9)
Immature Retic Fract: 21.6 % — ABNORMAL HIGH (ref 1.60–10.00)
LYMPH%: 19 % (ref 14.0–49.7)
MCH: 30.7 pg (ref 25.1–34.0)
MCHC: 31.9 g/dL (ref 31.5–36.0)
NEUT%: 71.6 % (ref 38.4–76.8)
Platelets: 433 10*3/uL — ABNORMAL HIGH (ref 145–400)

## 2012-06-30 LAB — TECHNOLOGIST REVIEW

## 2012-07-01 ENCOUNTER — Ambulatory Visit (HOSPITAL_BASED_OUTPATIENT_CLINIC_OR_DEPARTMENT_OTHER): Payer: Medicare Other

## 2012-07-01 VITALS — BP 121/67 | HR 86 | Temp 98.1°F | Resp 18

## 2012-07-01 DIAGNOSIS — F329 Major depressive disorder, single episode, unspecified: Secondary | ICD-10-CM

## 2012-07-01 DIAGNOSIS — D649 Anemia, unspecified: Secondary | ICD-10-CM

## 2012-07-01 DIAGNOSIS — F411 Generalized anxiety disorder: Secondary | ICD-10-CM

## 2012-07-01 LAB — PREPARE RBC (CROSSMATCH)

## 2012-07-01 MED ORDER — LORAZEPAM 2 MG/ML IJ SOLN
1.0000 mg | Freq: Once | INTRAMUSCULAR | Status: AC
  Administered 2012-07-01: 1 mg via INTRAVENOUS

## 2012-07-01 MED ORDER — SODIUM CHLORIDE 0.9 % IV SOLN
250.0000 mL | Freq: Once | INTRAVENOUS | Status: AC
  Administered 2012-07-01: 250 mL via INTRAVENOUS

## 2012-07-01 NOTE — Patient Instructions (Addendum)
Blood Transfusion Information WHAT IS A BLOOD TRANSFUSION? A transfusion is the replacement of blood or some of its parts. Blood is made up of multiple cells which provide different functions.  Red blood cells carry oxygen and are used for blood loss replacement.  White blood cells fight against infection.  Platelets control bleeding.  Plasma helps clot blood.  Other blood products are available for specialized needs, such as hemophilia or other clotting disorders. BEFORE THE TRANSFUSION  Who gives blood for transfusions?   You may be able to donate blood to be used at a later date on yourself (autologous donation).  Relatives can be asked to donate blood. This is generally not any safer than if you have received blood from a stranger. The same precautions are taken to ensure safety when a relative's blood is donated.  Healthy volunteers who are fully evaluated to make sure their blood is safe. This is blood bank blood. Transfusion therapy is the safest it has ever been in the practice of medicine. Before blood is taken from a donor, a complete history is taken to make sure that person has no history of diseases nor engages in risky social behavior (examples are intravenous drug use or sexual activity with multiple partners). The donor's travel history is screened to minimize risk of transmitting infections, such as malaria. The donated blood is tested for signs of infectious diseases, such as HIV and hepatitis. The blood is then tested to be sure it is compatible with you in order to minimize the chance of a transfusion reaction. If you or a relative donates blood, this is often done in anticipation of surgery and is not appropriate for emergency situations. It takes many days to process the donated blood. RISKS AND COMPLICATIONS Although transfusion therapy is very safe and saves many lives, the main dangers of transfusion include:   Getting an infectious disease.  Developing a  transfusion reaction. This is an allergic reaction to something in the blood you were given. Every precaution is taken to prevent this. The decision to have a blood transfusion has been considered carefully by your caregiver before blood is given. Blood is not given unless the benefits outweigh the risks. AFTER THE TRANSFUSION  Right after receiving a blood transfusion, you will usually feel much better and more energetic. This is especially true if your red blood cells have gotten low (anemic). The transfusion raises the level of the red blood cells which carry oxygen, and this usually causes an energy increase.  The nurse administering the transfusion will monitor you carefully for complications. HOME CARE INSTRUCTIONS  No special instructions are needed after a transfusion. You may find your energy is better. Speak with your caregiver about any limitations on activity for underlying diseases you may have. SEEK MEDICAL CARE IF:   Your condition is not improving after your transfusion.  You develop redness or irritation at the intravenous (IV) site. SEEK IMMEDIATE MEDICAL CARE IF:  Any of the following symptoms occur over the next 12 hours:  Shaking chills.  You have a temperature by mouth above 102 F (38.9 C), not controlled by medicine.  Chest, back, or muscle pain.  People around you feel you are not acting correctly or are confused.  Shortness of breath or difficulty breathing.  Dizziness and fainting.  You get a rash or develop hives.  You have a decrease in urine output.  Your urine turns a dark color or changes to pink, red, or brown. Any of the following   symptoms occur over the next 10 days:  You have a temperature by mouth above 102 F (38.9 C), not controlled by medicine.  Shortness of breath.  Weakness after normal activity.  The white part of the eye turns yellow (jaundice).  You have a decrease in the amount of urine or are urinating less often.  Your  urine turns a dark color or changes to pink, red, or brown. Document Released: 06/29/2000 Document Revised: 09/24/2011 Document Reviewed: 02/16/2008 [facility] Patient Information 2013 [place], [place].  Call Cancer Center at [phone] for any questions or concerns.

## 2012-07-01 NOTE — Progress Notes (Signed)
IV inserted x1 left forearm.  Good blood return noted and flushed easily.  IV ativan given and then swelling at site noted immediately afterwards.  Stopped infusion.  Pt concerned about ativan not being absorbed immediately as it likely infiltrated.  Verbal order from Dr. Cyndie Chime, ok to give another 1 mg of ativan IV once new IV site is established.  IV x 1 attempted by Raliegh Ip, RN on left forearm,  Good blood return and then blew. IV x 2 by Felicita Gage, RN,  Once to left forearm also w/ good blood return and it blew.  Next IV by Zella Ball, 4th one to left wrist w/ good blood return. Flushed easily.  Pt tolerated very well.   Asked pt to call for ride home on d/c since she had 2 mg ativan total.  Pt agreed and called a friend, Gabriel Rung, to pick her up.

## 2012-07-02 LAB — TYPE AND SCREEN
ABO/RH(D): A POS
Antibody Screen: NEGATIVE
Unit division: 0

## 2012-07-21 ENCOUNTER — Other Ambulatory Visit (HOSPITAL_BASED_OUTPATIENT_CLINIC_OR_DEPARTMENT_OTHER): Payer: Medicare Other | Admitting: Lab

## 2012-07-21 ENCOUNTER — Telehealth: Payer: Self-pay | Admitting: *Deleted

## 2012-07-21 DIAGNOSIS — D469 Myelodysplastic syndrome, unspecified: Secondary | ICD-10-CM

## 2012-07-21 DIAGNOSIS — D47Z9 Other specified neoplasms of uncertain behavior of lymphoid, hematopoietic and related tissue: Secondary | ICD-10-CM

## 2012-07-21 LAB — CBC & DIFF AND RETIC
BASO%: 0.4 % (ref 0.0–2.0)
Basophils Absolute: 0.1 10*3/uL (ref 0.0–0.1)
EOS%: 1.9 % (ref 0.0–7.0)
HCT: 28.7 % — ABNORMAL LOW (ref 34.8–46.6)
HGB: 9.3 g/dL — ABNORMAL LOW (ref 11.6–15.9)
Immature Retic Fract: 22.3 % — ABNORMAL HIGH (ref 1.60–10.00)
LYMPH%: 24.6 % (ref 14.0–49.7)
MCH: 31.2 pg (ref 25.1–34.0)
MCHC: 32.4 g/dL (ref 31.5–36.0)
MCV: 96.3 fL (ref 79.5–101.0)
MONO%: 6.3 % (ref 0.0–14.0)
NEUT%: 66.8 % (ref 38.4–76.8)
lymph#: 4.6 10*3/uL — ABNORMAL HIGH (ref 0.9–3.3)

## 2012-07-21 NOTE — Telephone Encounter (Signed)
Pt left vm that her hgb was 9.3 today & that she is feeling good & her ENT put her on prednisone x 6 days which she has completed & wants to know if prednisone could make her hgb elevated.  Called pt back & reported that prednisone can elevate platelets & WBC but not sure about hgb but possible or could just be from blood transfusion.  She will return in 3 wks.

## 2012-07-30 ENCOUNTER — Telehealth: Payer: Self-pay | Admitting: *Deleted

## 2012-07-30 NOTE — Telephone Encounter (Signed)
Patient wonders if it is ok to go to [facility] if they can see her earlier and if she is feeling ok?   Yes, it is fine.

## 2012-08-11 ENCOUNTER — Ambulatory Visit (HOSPITAL_BASED_OUTPATIENT_CLINIC_OR_DEPARTMENT_OTHER): Payer: Medicare Other | Admitting: Oncology

## 2012-08-11 ENCOUNTER — Encounter (HOSPITAL_COMMUNITY)
Admission: RE | Admit: 2012-08-11 | Discharge: 2012-08-11 | Disposition: A | Discharge status: Home / Self Care | Payer: Medicare Other | Source: Ambulatory Visit | Attending: Oncology | Admitting: Oncology

## 2012-08-11 ENCOUNTER — Other Ambulatory Visit (HOSPITAL_BASED_OUTPATIENT_CLINIC_OR_DEPARTMENT_OTHER): Payer: Medicare Other | Admitting: Lab

## 2012-08-11 ENCOUNTER — Telehealth: Payer: Self-pay | Admitting: Oncology

## 2012-08-11 VITALS — BP 122/46 | HR 99 | Temp 98.2°F | Resp 20 | Ht 62.0 in | Wt 173.8 lb

## 2012-08-11 DIAGNOSIS — D649 Anemia, unspecified: Secondary | ICD-10-CM

## 2012-08-11 DIAGNOSIS — D469 Myelodysplastic syndrome, unspecified: Secondary | ICD-10-CM

## 2012-08-11 DIAGNOSIS — C44319 Basal cell carcinoma of skin of other parts of face: Secondary | ICD-10-CM

## 2012-08-11 DIAGNOSIS — F341 Dysthymic disorder: Secondary | ICD-10-CM

## 2012-08-11 DIAGNOSIS — D47Z9 Other specified neoplasms of uncertain behavior of lymphoid, hematopoietic and related tissue: Secondary | ICD-10-CM

## 2012-08-11 LAB — CBC & DIFF AND RETIC
Basophils Absolute: 0.1 10*3/uL (ref 0.0–0.1)
EOS%: 2.3 % (ref 0.0–7.0)
Eosinophils Absolute: 0.3 10*3/uL (ref 0.0–0.5)
HCT: 21.9 % — ABNORMAL LOW (ref 34.8–46.6)
HGB: 6.9 g/dL — CL (ref 11.6–15.9)
MCH: 29.7 pg (ref 25.1–34.0)
MCV: 94.4 fL (ref 79.5–101.0)
MONO%: 7.7 % (ref 0.0–14.0)
NEUT#: 9.4 10*3/uL — ABNORMAL HIGH (ref 1.5–6.5)
NEUT%: 62.8 % (ref 38.4–76.8)
Platelets: 451 10*3/uL — ABNORMAL HIGH (ref 145–400)
RDW: 30 % — ABNORMAL HIGH (ref 11.2–14.5)
Retic %: 4.05 % — ABNORMAL HIGH (ref 0.70–2.10)

## 2012-08-11 LAB — HOLD TUBE, BLOOD BANK

## 2012-08-11 NOTE — Telephone Encounter (Signed)
Gave pt appt for labs and MD for February to May 2014 with MD visit

## 2012-08-11 NOTE — Patient Instructions (Signed)
Return for blood transfusion Wednesday 1/30 Continue every 3 week lab checks MD visit May 9

## 2012-08-12 ENCOUNTER — Other Ambulatory Visit: Payer: Self-pay | Admitting: *Deleted

## 2012-08-12 DIAGNOSIS — D649 Anemia, unspecified: Secondary | ICD-10-CM

## 2012-08-12 LAB — PREPARE RBC (CROSSMATCH)

## 2012-08-12 MED ORDER — LORAZEPAM 2 MG/ML IJ SOLN: 1.0000 mg | Freq: Once | INTRAMUSCULAR | Status: DC

## 2012-08-12 NOTE — Progress Notes (Signed)
Hematology and Oncology Follow Up Visit  Carolyn Brennan [id number] 03/26/1941 72 y.o. 08/12/2012 5:37 PM   Principle Diagnosis: Encounter Diagnoses  Name Primary?  [name] MDS/MPN (myelodysplastic/myeloproliferative neoplasms) Yes  . 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 She had minimal response to hematopoietic growth factors used in the past. She is currently receiving blood about every 6 weeks. As anticipated, we are starting to see signs of iron overload with most recent ferritin 1584 on 06/03/2012. Overall she tolerates her anemia well. She's never had any chest pain or pressure. She does get fatigue and dyspnea on exertion.  Medications: reviewed  Allergies: No Known Allergies  Review of Systems: Constitutional:  See above  Respiratory: See above Cardiovascular:  See above Gastrointestinal: No abdominal pain no change in bowel habit Genito-Urinary: No urinary tract symptoms Musculoskeletal: No muscle, bone, or joint pains Neurologic: No headache or change in vision Skin: No rash or ecchymosis. She recently saw Dr. Emily Filbert. A small skin lesion was removed from the bridge of her nose. She was told this was an early basal cell carcinoma and was scheduled for Mohs surgery she is quite anxious about this and wonders if it needs to be done. Remaining ROS negative.  Physical Exam: Blood pressure 122/46, pulse 99, temperature 98.2 F (36.8 C), temperature source Oral, resp. rate 20, height 5\' 2"  (1.575 m), weight 173 lb 12.8 oz (78.835 kg). Wt Readings from Last 3 Encounters:  08/11/12 173 lb 12.8 oz (78.835 kg)  04/16/12 174 lb 12.8 oz (79.289 kg)  04/07/12 175 lb 6.4 oz (79.561 kg)     General appearance: Well-nourished Caucasian woman bubbly as usual HENNT: Pharynx  no erythema or exudate Lymph nodes: No adenopathy Breasts: Lungs: Clear to auscultation resonant to percussion Heart: Regular rhythm 1/6 systolic murmur at the left sternal border Abdomen: Soft, nontender, no mass, no organomegaly Extremities: No edema, no calf tenderness Vascular: No cyanosis Neurologic: Alert and oriented, PERRLA, motor strength 5 over 5, reflexes 1+ symmetric Skin: No rash or ecchymosis  Lab Results: Lab Results  Component Value Date   WBC 15.0* 08/11/2012   HGB 6.9* 08/11/2012   HCT 21.9* 08/11/2012   MCV 94.4 08/11/2012   PLT 451* 08/11/2012     Chemistry      Component Value Date/Time   NA 138 06/03/2012 1120   NA 137 02/21/2012 1608   K 4.3 06/03/2012 1120   K 4.1 02/21/2012 1608   CL 105 06/03/2012 1120   CL 102 02/21/2012 1608   CO2 27 06/03/2012 1120   CO2 27 02/21/2012 1608   BUN 16.0 06/03/2012 1120   BUN 13 02/21/2012 1608   CREATININE 0.7 06/03/2012 1120   CREATININE 0.61 02/21/2012 1608      Component Value Date/Time   CALCIUM 9.8 06/03/2012 1120   CALCIUM 9.2 02/21/2012 1608   ALKPHOS 74 06/03/2012 1120   ALKPHOS 59 02/21/2012 1608   AST 27 06/03/2012 1120   AST 22 02/21/2012 1608   ALT 34 06/03/2012 1120   ALT 21 02/21/2012 1608   BILITOT 2.74* 06/03/2012 1120   BILITOT 2.7* 02/21/2012 1608       Impression and Plan: #1. Myeloproliferative disorder with dysplastic features. Plan: Continue intermittent transfusion therapy. We have discussed iron chelation therapy recently. She really has to analyze every decision that she makes. I will  try to give her some information to read about [employer].  #2. 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 She remains anxious about the Mohs surgery. I told her that if she does decide to have the surgery, there are no contraindications from my point of view since this should be a bloodless procedure and she has a normal platelet count.  #3.  Chronic anxiety and depression.  CC:. Dr. Randolm Idol; Dr. Leda Quail; Dr. Elmon Else   Levert Feinstein, MD 1/28/20145:37 PM

## 2012-08-13 ENCOUNTER — Ambulatory Visit (HOSPITAL_BASED_OUTPATIENT_CLINIC_OR_DEPARTMENT_OTHER): Payer: Medicare Other

## 2012-08-13 VITALS — BP 127/60 | HR 73 | Temp 97.3°F | Resp 18

## 2012-08-13 DIAGNOSIS — D649 Anemia, unspecified: Secondary | ICD-10-CM

## 2012-08-13 DIAGNOSIS — D4620 Refractory anemia with excess of blasts, unspecified: Secondary | ICD-10-CM

## 2012-08-13 DIAGNOSIS — D469 Myelodysplastic syndrome, unspecified: Secondary | ICD-10-CM

## 2012-08-13 DIAGNOSIS — F329 Major depressive disorder, single episode, unspecified: Secondary | ICD-10-CM

## 2012-08-13 MED ORDER — LORAZEPAM 2 MG/ML IJ SOLN
1.0000 mg | Freq: Once | INTRAMUSCULAR | Status: AC
  Administered 2012-08-13: 1 mg via INTRAVENOUS

## 2012-08-13 MED ORDER — SODIUM CHLORIDE 0.9 % IV SOLN
250.0000 mL | Freq: Once | INTRAVENOUS | Status: AC
  Administered 2012-08-13: 250 mL via INTRAVENOUS

## 2012-08-13 NOTE — Progress Notes (Signed)
Patient instructed not to drive as she received Ativan. Patient stated her cousin Tobi Bastos is driving her home today.  Patient received 1 mg Ativan IV today. Patient ambulated to bathroom with no complaints of dizziness, headache or unsteady gait.

## 2012-08-13 NOTE — Patient Instructions (Addendum)
Blood Transfusion Information WHAT IS A BLOOD TRANSFUSION? A transfusion is the replacement of blood or some of its parts. Blood is made up of multiple cells which provide different functions.  Red blood cells carry oxygen and are used for blood loss replacement.  White blood cells fight against infection.  Platelets control bleeding.  Plasma helps clot blood.  Other blood products are available for specialized needs, such as hemophilia or other clotting disorders. BEFORE THE TRANSFUSION  Who gives blood for transfusions?   You may be able to donate blood to be used at a later date on yourself (autologous donation).  Relatives can be asked to donate blood. This is generally not any safer than if you have received blood from a stranger. The same precautions are taken to ensure safety when a relative's blood is donated.  Healthy volunteers who are fully evaluated to make sure their blood is safe. This is blood bank blood. Transfusion therapy is the safest it has ever been in the practice of medicine. Before blood is taken from a donor, a complete history is taken to make sure that person has no history of diseases nor engages in risky social behavior (examples are intravenous drug use or sexual activity with multiple partners). The donor's travel history is screened to minimize risk of transmitting infections, such as malaria. The donated blood is tested for signs of infectious diseases, such as HIV and hepatitis. The blood is then tested to be sure it is compatible with you in order to minimize the chance of a transfusion reaction. If you or a relative donates blood, this is often done in anticipation of surgery and is not appropriate for emergency situations. It takes many days to process the donated blood. RISKS AND COMPLICATIONS Although transfusion therapy is very safe and saves many lives, the main dangers of transfusion include:   Getting an infectious disease.  Developing a  transfusion reaction. This is an allergic reaction to something in the blood you were given. Every precaution is taken to prevent this. The decision to have a blood transfusion has been considered carefully by your caregiver before blood is given. Blood is not given unless the benefits outweigh the risks. AFTER THE TRANSFUSION  Right after receiving a blood transfusion, you will usually feel much better and more energetic. This is especially true if your red blood cells have gotten low (anemic). The transfusion raises the level of the red blood cells which carry oxygen, and this usually causes an energy increase.  The nurse administering the transfusion will monitor you carefully for complications. HOME CARE INSTRUCTIONS  No special instructions are needed after a transfusion. You may find your energy is better. Speak with your caregiver about any limitations on activity for underlying diseases you may have. SEEK MEDICAL CARE IF:   Your condition is not improving after your transfusion.  You develop redness or irritation at the intravenous (IV) site. SEEK IMMEDIATE MEDICAL CARE IF:  Any of the following symptoms occur over the next 12 hours:  Shaking chills.  You have a temperature by mouth above 102 F (38.9 C), not controlled by medicine.  Chest, back, or muscle pain.  People around you feel you are not acting correctly or are confused.  Shortness of breath or difficulty breathing.  Dizziness and fainting.  You get a rash or develop hives.  You have a decrease in urine output.  Your urine turns a dark color or changes to pink, red, or brown. Any of the following   symptoms occur over the next 10 days:  You have a temperature by mouth above 102 F (38.9 C), not controlled by medicine.  Shortness of breath.  Weakness after normal activity.  The white part of the eye turns yellow (jaundice).  You have a decrease in the amount of urine or are urinating less often.  Your  urine turns a dark color or changes to pink, red, or brown. Document Released: 06/29/2000 Document Revised: 09/24/2011 Document Reviewed: 02/16/2008 [facility] Patient Information 2013 [place], [place].  Do not drive as you received Ativan.

## 2012-08-14 LAB — TYPE AND SCREEN
ABO/RH(D): A POS
Unit division: 0

## 2012-09-01 ENCOUNTER — Other Ambulatory Visit (HOSPITAL_BASED_OUTPATIENT_CLINIC_OR_DEPARTMENT_OTHER): Payer: Medicare Other

## 2012-09-01 DIAGNOSIS — D649 Anemia, unspecified: Secondary | ICD-10-CM

## 2012-09-01 DIAGNOSIS — D469 Myelodysplastic syndrome, unspecified: Secondary | ICD-10-CM

## 2012-09-01 LAB — COMPREHENSIVE METABOLIC PANEL (CC13)
Albumin: 4.3 g/dL (ref 3.5–5.0)
CO2: 24 mEq/L (ref 22–29)
Calcium: 9.3 mg/dL (ref 8.4–10.4)
Chloride: 103 mEq/L (ref 98–107)
Glucose: 108 mg/dl — ABNORMAL HIGH (ref 70–99)
Potassium: 4.2 mEq/L (ref 3.5–5.1)
Sodium: 138 mEq/L (ref 136–145)
Total Protein: 6.8 g/dL (ref 6.4–8.3)

## 2012-09-01 LAB — CBC & DIFF AND RETIC
Basophils Absolute: 0 10*3/uL (ref 0.0–0.1)
EOS%: 2.9 % (ref 0.0–7.0)
HCT: 24 % — ABNORMAL LOW (ref 34.8–46.6)
HGB: 7.8 g/dL — ABNORMAL LOW (ref 11.6–15.9)
MCH: 30.5 pg (ref 25.1–34.0)
MCHC: 32.5 g/dL (ref 31.5–36.0)
MCV: 93.8 fL (ref 79.5–101.0)
MONO%: 7.3 % (ref 0.0–14.0)
NEUT%: 65 % (ref 38.4–76.8)

## 2012-09-01 LAB — TECHNOLOGIST REVIEW

## 2012-09-02 ENCOUNTER — Telehealth: Payer: Self-pay | Admitting: *Deleted

## 2012-09-02 LAB — FERRITIN: Ferritin: 1882 ng/mL — ABNORMAL HIGH (ref 10–291)

## 2012-09-02 NOTE — Telephone Encounter (Signed)
Message copied by Sabino Snipes on Tue Sep 02, 2012  3:15 PM ------      Message from: Levert Feinstein      Created: Mon Sep 01, 2012  4:10 PM       Call pt hb 7.8  We can watch - no transfusion this time ------

## 2012-09-02 NOTE — Telephone Encounter (Signed)
Discussed lab results with pt & informed no transfusion at this time & she agreed.

## 2012-09-22 ENCOUNTER — Other Ambulatory Visit: Payer: Medicare Other

## 2012-09-23 ENCOUNTER — Telehealth: Payer: Self-pay | Admitting: Oncology

## 2012-09-23 NOTE — Telephone Encounter (Signed)
Talked to pt and gave her lab appt will call later for blood tx appts

## 2012-09-24 ENCOUNTER — Other Ambulatory Visit: Payer: Self-pay

## 2012-09-24 ENCOUNTER — Encounter (HOSPITAL_COMMUNITY)
Admission: RE | Admit: 2012-09-24 | Discharge: 2012-09-24 | Disposition: A | Discharge status: Home / Self Care | Payer: Medicare Other | Source: Ambulatory Visit | Attending: Oncology | Admitting: Oncology

## 2012-09-24 ENCOUNTER — Ambulatory Visit (HOSPITAL_BASED_OUTPATIENT_CLINIC_OR_DEPARTMENT_OTHER): Payer: Medicare Other

## 2012-09-24 ENCOUNTER — Other Ambulatory Visit (HOSPITAL_BASED_OUTPATIENT_CLINIC_OR_DEPARTMENT_OTHER): Payer: Medicare Other | Admitting: Lab

## 2012-09-24 VITALS — BP 126/57 | HR 68 | Temp 97.6°F | Resp 18

## 2012-09-24 DIAGNOSIS — D649 Anemia, unspecified: Secondary | ICD-10-CM | POA: Insufficient documentation

## 2012-09-24 DIAGNOSIS — D469 Myelodysplastic syndrome, unspecified: Secondary | ICD-10-CM

## 2012-09-24 DIAGNOSIS — D47Z9 Other specified neoplasms of uncertain behavior of lymphoid, hematopoietic and related tissue: Secondary | ICD-10-CM

## 2012-09-24 LAB — CBC & DIFF AND RETIC
Basophils Absolute: 0.1 10*3/uL (ref 0.0–0.1)
EOS%: 3 % (ref 0.0–7.0)
HCT: 20.2 % — ABNORMAL LOW (ref 34.8–46.6)
HGB: 6.3 g/dL — CL (ref 11.6–15.9)
LYMPH%: 20.7 % (ref 14.0–49.7)
MCH: 30 pg (ref 25.1–34.0)
MCV: 96.2 fL (ref 79.5–101.0)
MONO%: 7.5 % (ref 0.0–14.0)
NEUT%: 68.2 % (ref 38.4–76.8)
Platelets: 443 10*3/uL — ABNORMAL HIGH (ref 145–400)

## 2012-09-24 LAB — PREPARE RBC (CROSSMATCH)

## 2012-09-24 LAB — HOLD TUBE, BLOOD BANK

## 2012-09-24 LAB — TECHNOLOGIST REVIEW

## 2012-09-24 MED ORDER — LORAZEPAM 2 MG/ML IJ SOLN
1.0000 mg | Freq: Once | INTRAMUSCULAR | Status: DC
  Administered 2012-09-24: 1 mg via INTRAVENOUS

## 2012-09-24 MED ORDER — SODIUM CHLORIDE 0.9 % IV SOLN: 250.0000 mL | Freq: Once | INTRAVENOUS | Status: DC

## 2012-09-24 NOTE — Patient Instructions (Addendum)
Blood Transfusion Information WHAT IS A BLOOD TRANSFUSION? A transfusion is the replacement of blood or some of its parts. Blood is made up of multiple cells which provide different functions.  Red blood cells carry oxygen and are used for blood loss replacement.  White blood cells fight against infection.  Platelets control bleeding.  Plasma helps clot blood.  Other blood products are available for specialized needs, such as hemophilia or other clotting disorders. BEFORE THE TRANSFUSION  Who gives blood for transfusions?   You may be able to donate blood to be used at a later date on yourself (autologous donation).  Relatives can be asked to donate blood. This is generally not any safer than if you have received blood from a stranger. The same precautions are taken to ensure safety when a relative's blood is donated.  Healthy volunteers who are fully evaluated to make sure their blood is safe. This is blood bank blood. Transfusion therapy is the safest it has ever been in the practice of medicine. Before blood is taken from a donor, a complete history is taken to make sure that person has no history of diseases nor engages in risky social behavior (examples are intravenous drug use or sexual activity with multiple partners). The donor's travel history is screened to minimize risk of transmitting infections, such as malaria. The donated blood is tested for signs of infectious diseases, such as HIV and hepatitis. The blood is then tested to be sure it is compatible with you in order to minimize the chance of a transfusion reaction. If you or a relative donates blood, this is often done in anticipation of surgery and is not appropriate for emergency situations. It takes many days to process the donated blood. RISKS AND COMPLICATIONS Although transfusion therapy is very safe and saves many lives, the main dangers of transfusion include:   Getting an infectious disease.  Developing a  transfusion reaction. This is an allergic reaction to something in the blood you were given. Every precaution is taken to prevent this. The decision to have a blood transfusion has been considered carefully by your caregiver before blood is given. Blood is not given unless the benefits outweigh the risks. AFTER THE TRANSFUSION  Right after receiving a blood transfusion, you will usually feel much better and more energetic. This is especially true if your red blood cells have gotten low (anemic). The transfusion raises the level of the red blood cells which carry oxygen, and this usually causes an energy increase.  The nurse administering the transfusion will monitor you carefully for complications. HOME CARE INSTRUCTIONS  No special instructions are needed after a transfusion. You may find your energy is better. Speak with your caregiver about any limitations on activity for underlying diseases you may have. SEEK MEDICAL CARE IF:   Your condition is not improving after your transfusion.  You develop redness or irritation at the intravenous (IV) site. SEEK IMMEDIATE MEDICAL CARE IF:  Any of the following symptoms occur over the next 12 hours:  Shaking chills.  You have a temperature by mouth above 102 F (38.9 C), not controlled by medicine.  Chest, back, or muscle pain.  People around you feel you are not acting correctly or are confused.  Shortness of breath or difficulty breathing.  Dizziness and fainting.  You get a rash or develop hives.  You have a decrease in urine output.  Your urine turns a dark color or changes to pink, red, or brown. Any of the following   symptoms occur over the next 10 days:  You have a temperature by mouth above 102 F (38.9 C), not controlled by medicine.  Shortness of breath.  Weakness after normal activity.  The white part of the eye turns yellow (jaundice).  You have a decrease in the amount of urine or are urinating less often.  Your  urine turns a dark color or changes to pink, red, or brown. Document Released: 06/29/2000 Document Revised: 09/24/2011 Document Reviewed: 02/16/2008 [employer] Patient Information 2013 [employer].  

## 2012-09-24 NOTE — Progress Notes (Signed)
Talked to pt in lobby and informed her of need for 2 units RBC's transfusion, per MD, d/t hgb 6.3.  Pt verbalizes understanding.

## 2012-09-25 ENCOUNTER — Telehealth: Payer: Self-pay | Admitting: *Deleted

## 2012-09-25 LAB — TYPE AND SCREEN

## 2012-09-25 NOTE — Telephone Encounter (Signed)
Received call from Cameo RN/Infusion room/[facility] stating that pt is a hard IV stick & is interested in a port a cath.  Note to Dr Cyndie Chime.

## 2012-09-29 ENCOUNTER — Telehealth: Payer: Self-pay | Admitting: *Deleted

## 2012-09-29 NOTE — Telephone Encounter (Signed)
Patient called and she is having a Moh's procedure done on March 26th,  So she would like her PAC placed after that.   Will request that Dr. Cyndie Chime put in the order for the week of March 31st, if possible.  She knows to expect a call for when this will be scheduled.

## 2012-10-03 ENCOUNTER — Other Ambulatory Visit: Payer: Self-pay | Admitting: *Deleted

## 2012-10-03 DIAGNOSIS — D469 Myelodysplastic syndrome, unspecified: Secondary | ICD-10-CM

## 2012-10-06 ENCOUNTER — Encounter (HOSPITAL_COMMUNITY): Payer: Self-pay | Admitting: Pharmacy Technician

## 2012-10-08 ENCOUNTER — Telehealth: Payer: Self-pay | Admitting: *Deleted

## 2012-10-08 NOTE — Telephone Encounter (Signed)
Received call from Tina/Scheduler/IR/[facility] stating that pt is coming in for [facility] placement on tues 10/14/12 & has lab at our office on 3/31.  She states that they can draw lab day of PAC to save her a trip.  Returned call & OK given to draw lab at this time & we will cancel lab for 10/13/12. Inetta Fermo states pt is in agreement of this. Asked that she fax copy of lab to [place].

## 2012-10-10 ENCOUNTER — Other Ambulatory Visit: Payer: Self-pay | Admitting: Radiology

## 2012-10-13 ENCOUNTER — Other Ambulatory Visit: Payer: Medicare Other

## 2012-10-14 ENCOUNTER — Other Ambulatory Visit: Payer: Self-pay | Admitting: Oncology

## 2012-10-14 ENCOUNTER — Encounter (HOSPITAL_COMMUNITY): Payer: Self-pay

## 2012-10-14 ENCOUNTER — Ambulatory Visit (HOSPITAL_COMMUNITY)
Admission: RE | Admit: 2012-10-14 | Discharge: 2012-10-14 | Disposition: A | Discharge status: Home / Self Care | Payer: Medicare Other | Source: Ambulatory Visit | Attending: Oncology | Admitting: Oncology

## 2012-10-14 DIAGNOSIS — D47Z9 Other specified neoplasms of uncertain behavior of lymphoid, hematopoietic and related tissue: Secondary | ICD-10-CM | POA: Insufficient documentation

## 2012-10-14 DIAGNOSIS — D469 Myelodysplastic syndrome, unspecified: Secondary | ICD-10-CM

## 2012-10-14 DIAGNOSIS — F411 Generalized anxiety disorder: Secondary | ICD-10-CM | POA: Insufficient documentation

## 2012-10-14 DIAGNOSIS — D649 Anemia, unspecified: Secondary | ICD-10-CM | POA: Insufficient documentation

## 2012-10-14 HISTORY — DX: Anemia, unspecified: D64.9

## 2012-10-14 LAB — CBC WITH DIFFERENTIAL/PLATELET
Basophils Relative: 1 % (ref 0–1)
Eosinophils Absolute: 0.5 10*3/uL (ref 0.0–0.7)
Eosinophils Relative: 4 % (ref 0–5)
Hemoglobin: 7.7 g/dL — ABNORMAL LOW (ref 12.0–15.0)
Lymphocytes Relative: 29 % (ref 12–46)
Monocytes Relative: 7 % (ref 3–12)
Neutrophils Relative %: 59 % (ref 43–77)
RBC: 2.52 MIL/uL — ABNORMAL LOW (ref 3.87–5.11)
WBC: 11.5 10*3/uL — ABNORMAL HIGH (ref 4.0–10.5)

## 2012-10-14 LAB — PROTIME-INR
INR: 1.14 (ref 0.00–1.49)
Prothrombin Time: 14.4 seconds (ref 11.6–15.2)

## 2012-10-14 LAB — APTT: aPTT: 48 seconds — ABNORMAL HIGH (ref 24–37)

## 2012-10-14 MED ORDER — MIDAZOLAM HCL 2 MG/2ML IJ SOLN
INTRAMUSCULAR | Status: AC
  Filled 2012-10-14: qty 4

## 2012-10-14 MED ORDER — LIDOCAINE HCL 1 % IJ SOLN
INTRAMUSCULAR | Status: AC
  Filled 2012-10-14: qty 20

## 2012-10-14 MED ORDER — MIDAZOLAM HCL 2 MG/2ML IJ SOLN
INTRAMUSCULAR | Status: AC | PRN
  Administered 2012-10-14: 2 mg via INTRAVENOUS

## 2012-10-14 MED ORDER — SODIUM CHLORIDE 0.9 % IV SOLN
INTRAVENOUS | Status: DC
  Administered 2012-10-14: 12:00:00 via INTRAVENOUS

## 2012-10-14 MED ORDER — IOHEXOL 300 MG/ML  SOLN
20.0000 mL | Freq: Once | INTRAMUSCULAR | Status: AC | PRN
  Administered 2012-10-14: 1 mL via INTRAVENOUS

## 2012-10-14 MED ORDER — HEPARIN SOD (PORK) LOCK FLUSH 100 UNIT/ML IV SOLN: 500.0000 [IU] | Freq: Once | INTRAVENOUS | Status: DC

## 2012-10-14 MED ORDER — FENTANYL CITRATE 0.05 MG/ML IJ SOLN
INTRAMUSCULAR | Status: AC | PRN
  Administered 2012-10-14: 100 ug via INTRAVENOUS

## 2012-10-14 MED ORDER — CEFAZOLIN SODIUM 1-5 GM-% IV SOLN
1.0000 g | Freq: Once | INTRAVENOUS | Status: DC
  Filled 2012-10-14: qty 50

## 2012-10-14 MED ORDER — FENTANYL CITRATE 0.05 MG/ML IJ SOLN
INTRAMUSCULAR | Status: AC
  Filled 2012-10-14: qty 4

## 2012-10-14 NOTE — H&P (Signed)
Carolyn Brennan is an 72 y.o. female.   Chief Complaint: Myeloproliferative; myelodysplastic disorder Progressive anemia Scheduled for Port a Cath placement per Dr Cyndie Chime For transfusions HPI: anemia; anxiety  Past Medical History  Diagnosis Date  . 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 Anxiety and depression 08/12/2011  . Anemia     Past Surgical History  Procedure Laterality Date  . Neck plastic surgery    . Abdominoplasty/panniculectomy    . Tubal ligation    . Breast reduction surgery    . Appendectomy      History reviewed. No pertinent family history. Social History:  reports that she has quit smoking. She started smoking about 20 years ago. She does not have any smokeless tobacco history on file. Her alcohol and drug histories are not on file.  Allergies: No Known Allergies   (Not in a hospital admission)  Results for orders placed during the hospital encounter of 10/14/12 (from the past 48 hour(s))  APTT     Status: Abnormal   Collection Time    10/14/12 11:50 AM      Result Value Range   aPTT 48 (*) 24 - 37 seconds   Comment:            IF BASELINE aPTT IS ELEVATED,     SUGGEST PATIENT RISK ASSESSMENT     BE USED TO DETERMINE APPROPRIATE     ANTICOAGULANT THERAPY.  CBC WITH DIFFERENTIAL     Status: Abnormal   Collection Time    10/14/12 11:50 AM      Result Value Range   WBC 11.5 (*) 4.0 - 10.5 K/uL   RBC 2.52 (*) 3.87 - 5.11 MIL/uL   Hemoglobin 7.7 (*) 12.0 - 15.0 g/dL   HCT 16.1 (*) 09.6 - 04.5 %   MCV 93.7  78.0 - 100.0 fL   MCH 30.6  26.0 - 34.0 pg   MCHC 32.6  30.0 - 36.0 g/dL   RDW 40.9 (*) 81.1 - 91.4 %   Platelets 455 (*) 150 - 400 K/uL   Neutrophils Relative 59  43 - 77 %   Lymphocytes Relative 29  12 - 46 %   Monocytes Relative 7  3 - 12 %   Eosinophils Relative 4  0 - 5 %   Basophils Relative 1  0 - 1 %   Neutro Abs 6.8  1.7 - 7.7 K/uL   Lymphs Abs 3.3  0.7 - 4.0 K/uL   Monocytes Absolute 0.8  0.1 -  1.0 K/uL   Eosinophils Absolute 0.5  0.0 - 0.7 K/uL   Basophils Absolute 0.1  0.0 - 0.1 K/uL   RBC Morphology ELLIPTOCYTES     Comment: TEARDROP CELLS     RARE NRBCs  PROTIME-INR     Status: None   Collection Time    10/14/12 11:50 AM      Result Value Range   Prothrombin Time 14.4  11.6 - 15.2 seconds   INR 1.14  0.00 - 1.49   No results found.  Review of Systems  Constitutional: Negative for fever.  Respiratory: Negative for sputum production.   Cardiovascular: Negative for chest pain.  Gastrointestinal: Negative for nausea and vomiting.  Neurological: Negative for weakness and headaches.    Blood pressure 147/77, pulse 70, temperature 98.4 F (36.9 C), temperature source Oral, resp. rate 20, height 5\' 2"  (1.575 m), weight 170 lb (77.111 kg), SpO2 99.00%. Physical Exam  Constitutional: She is oriented to person, place, and time. She  appears well-developed.  Cardiovascular: Normal rate, regular rhythm and normal heart sounds.   No murmur heard. Respiratory: Effort normal and breath sounds normal. She has no wheezes.  GI: Soft. Bowel sounds are normal. There is no tenderness.  Musculoskeletal: Normal range of motion.  Neurological: She is alert and oriented to person, place, and time.  Skin: Skin is warm and dry.  Psychiatric: She has a normal mood and affect. Her behavior is normal. Judgment and thought content normal.     Assessment/Plan Myeloprolif/myelodyspastic disorder Progressive anemia Scheduled for PAC now Pt aware of procedure benefits and risks and agreeable to proceed consent signed and in chart  Hamlet Lasecki A 10/14/2012, 12:41 PM

## 2012-10-14 NOTE — Procedures (Signed)
Placement of right jugular portacath.  Tip in SVC.  Ready to use.  No immediate complication.

## 2012-11-03 ENCOUNTER — Other Ambulatory Visit: Payer: Self-pay | Admitting: Medical Oncology

## 2012-11-03 ENCOUNTER — Other Ambulatory Visit: Payer: Medicare Other | Admitting: Lab

## 2012-11-03 ENCOUNTER — Other Ambulatory Visit: Payer: Self-pay | Admitting: *Deleted

## 2012-11-03 ENCOUNTER — Ambulatory Visit: Payer: Medicare Other

## 2012-11-03 ENCOUNTER — Encounter: Payer: Self-pay | Admitting: Lab

## 2012-11-03 ENCOUNTER — Ambulatory Visit (HOSPITAL_BASED_OUTPATIENT_CLINIC_OR_DEPARTMENT_OTHER): Payer: Medicare Other | Admitting: Lab

## 2012-11-03 ENCOUNTER — Ambulatory Visit (HOSPITAL_BASED_OUTPATIENT_CLINIC_OR_DEPARTMENT_OTHER): Payer: Medicare Other

## 2012-11-03 ENCOUNTER — Encounter (HOSPITAL_COMMUNITY)
Admission: RE | Admit: 2012-11-03 | Discharge: 2012-11-03 | Disposition: A | Discharge status: Home / Self Care | Payer: Medicare Other | Source: Ambulatory Visit | Attending: Oncology | Admitting: Oncology

## 2012-11-03 VITALS — BP 134/59 | HR 72 | Temp 98.0°F | Resp 18

## 2012-11-03 DIAGNOSIS — D649 Anemia, unspecified: Secondary | ICD-10-CM

## 2012-11-03 DIAGNOSIS — F411 Generalized anxiety disorder: Secondary | ICD-10-CM

## 2012-11-03 DIAGNOSIS — D47Z9 Other specified neoplasms of uncertain behavior of lymphoid, hematopoietic and related tissue: Secondary | ICD-10-CM

## 2012-11-03 DIAGNOSIS — D469 Myelodysplastic syndrome, unspecified: Secondary | ICD-10-CM

## 2012-11-03 DIAGNOSIS — Z95828 Presence of other vascular implants and grafts: Secondary | ICD-10-CM

## 2012-11-03 DIAGNOSIS — F329 Major depressive disorder, single episode, unspecified: Secondary | ICD-10-CM

## 2012-11-03 LAB — CBC & DIFF AND RETIC
Eosinophils Absolute: 0.4 10*3/uL (ref 0.0–0.5)
HCT: 18.3 % — ABNORMAL LOW (ref 34.8–46.6)
Immature Retic Fract: 18.4 % — ABNORMAL HIGH (ref 1.60–10.00)
LYMPH%: 23.7 % (ref 14.0–49.7)
MONO#: 0.8 10*3/uL (ref 0.1–0.9)
NEUT#: 7.9 10*3/uL — ABNORMAL HIGH (ref 1.5–6.5)
NEUT%: 66.1 % (ref 38.4–76.8)
Platelets: 363 10*3/uL (ref 145–400)
RBC: 1.92 10*6/uL — ABNORMAL LOW (ref 3.70–5.45)
Retic Ct Abs: 66.24 10*3/uL (ref 33.70–90.70)
WBC: 12 10*3/uL — ABNORMAL HIGH (ref 3.9–10.3)
lymph#: 2.8 10*3/uL (ref 0.9–3.3)
nRBC: 1 % — ABNORMAL HIGH (ref 0–0)

## 2012-11-03 LAB — HOLD TUBE, BLOOD BANK

## 2012-11-03 LAB — PREPARE RBC (CROSSMATCH)

## 2012-11-03 MED ORDER — HEPARIN SOD (PORK) LOCK FLUSH 100 UNIT/ML IV SOLN
500.0000 [IU] | Freq: Once | INTRAVENOUS | Status: AC
  Administered 2012-11-03: 500 [IU] via INTRAVENOUS
  Filled 2012-11-03: qty 5

## 2012-11-03 MED ORDER — SODIUM CHLORIDE 0.9 % IJ SOLN
10.0000 mL | INTRAMUSCULAR | Status: DC | PRN
  Administered 2012-11-03: 10 mL via INTRAVENOUS
  Filled 2012-11-03: qty 10

## 2012-11-03 MED ORDER — LORAZEPAM 2 MG/ML IJ SOLN
1.0000 mg | Freq: Once | INTRAMUSCULAR | Status: AC
  Administered 2012-11-03: 1 mg via INTRAVENOUS

## 2012-11-03 MED ORDER — SODIUM CHLORIDE 0.9 % IV SOLN
250.0000 mL | Freq: Once | INTRAVENOUS | Status: AC
  Administered 2012-11-03: 250 mL via INTRAVENOUS

## 2012-11-03 MED ORDER — LIDOCAINE-PRILOCAINE 2.5-2.5 % EX CREA: TOPICAL_CREAM | CUTANEOUS | Status: DC | PRN

## 2012-11-03 NOTE — Patient Instructions (Signed)
Blood Transfusion   A blood transfusion replaces your blood or some of its parts. Blood is replaced when you have lost blood because of surgery, an accident, or for severe blood conditions like anemia.  You can donate blood to be used on yourself if you have a planned surgery. If you lose blood during that surgery, your own blood can be given back to you.  Any blood given to you is checked to make sure it matches your blood type. Your temperature, blood pressure, and heart rate (vital signs) will be checked often.   GET HELP RIGHT AWAY IF:    You feel sick to your stomach (nauseous) or throw up (vomit).   You have watery poop (diarrhea).   You have shortness of breath or trouble breathing.   You have blood in your pee (urine) or have dark colored pee.   You have chest pain or tightness.   Your eyes or skin turn yellow (jaundice).   You have a temperature by mouth above 102 F (38.9 C), not controlled by medicine.   You start to shake and have chills.   You develop a a red rash (hives) or feel itchy.   You develop lightheadedness or feel confused.   You develop back, joint, or muscle pain.   You do not feel hungry (lost appetite).   You feel tired, restless, or nervous.   You develop belly (abdominal) cramps.  Document Released: 09/28/2008 Document Revised: 09/24/2011 Document Reviewed: 09/28/2008  [employer] Patient Information 2013 [employer].

## 2012-11-04 ENCOUNTER — Telehealth: Payer: Self-pay | Admitting: *Deleted

## 2012-11-04 LAB — TYPE AND SCREEN
Antibody Screen: NEGATIVE
Unit division: 0

## 2012-11-04 NOTE — Telephone Encounter (Signed)
Received message pt requesting copy of labs to be mailed.  Copy sent to home address.

## 2012-11-15 ENCOUNTER — Emergency Department (HOSPITAL_COMMUNITY): Payer: Medicare Other

## 2012-11-15 ENCOUNTER — Encounter (HOSPITAL_COMMUNITY): Payer: Self-pay

## 2012-11-15 ENCOUNTER — Emergency Department (HOSPITAL_COMMUNITY)
Admission: EM | Admit: 2012-11-15 | Discharge: 2012-11-16 | Disposition: A | Discharge status: Home / Self Care | Payer: Medicare Other | Source: Home / Self Care | Attending: Emergency Medicine | Admitting: Emergency Medicine

## 2012-11-15 DIAGNOSIS — F3289 Other specified depressive episodes: Secondary | ICD-10-CM | POA: Diagnosis present

## 2012-11-15 DIAGNOSIS — E876 Hypokalemia: Secondary | ICD-10-CM | POA: Diagnosis present

## 2012-11-15 DIAGNOSIS — R509 Fever, unspecified: Secondary | ICD-10-CM

## 2012-11-15 DIAGNOSIS — A419 Sepsis, unspecified organism: Secondary | ICD-10-CM | POA: Diagnosis present

## 2012-11-15 DIAGNOSIS — K7689 Other specified diseases of liver: Secondary | ICD-10-CM | POA: Diagnosis present

## 2012-11-15 DIAGNOSIS — F411 Generalized anxiety disorder: Secondary | ICD-10-CM | POA: Diagnosis present

## 2012-11-15 DIAGNOSIS — D72829 Elevated white blood cell count, unspecified: Secondary | ICD-10-CM

## 2012-11-15 DIAGNOSIS — Z87891 Personal history of nicotine dependence: Secondary | ICD-10-CM

## 2012-11-15 DIAGNOSIS — Z7982 Long term (current) use of aspirin: Secondary | ICD-10-CM

## 2012-11-15 DIAGNOSIS — R197 Diarrhea, unspecified: Secondary | ICD-10-CM | POA: Diagnosis present

## 2012-11-15 DIAGNOSIS — D649 Anemia, unspecified: Secondary | ICD-10-CM | POA: Diagnosis present

## 2012-11-15 DIAGNOSIS — D630 Anemia in neoplastic disease: Secondary | ICD-10-CM | POA: Diagnosis present

## 2012-11-15 DIAGNOSIS — Z452 Encounter for adjustment and management of vascular access device: Secondary | ICD-10-CM

## 2012-11-15 DIAGNOSIS — Z79899 Other long term (current) drug therapy: Secondary | ICD-10-CM

## 2012-11-15 DIAGNOSIS — Y849 Medical procedure, unspecified as the cause of abnormal reaction of the patient, or of later complication, without mention of misadventure at the time of the procedure: Secondary | ICD-10-CM | POA: Diagnosis present

## 2012-11-15 DIAGNOSIS — T80211A Bloodstream infection due to central venous catheter, initial encounter: Principal | ICD-10-CM | POA: Diagnosis present

## 2012-11-15 DIAGNOSIS — F329 Major depressive disorder, single episode, unspecified: Secondary | ICD-10-CM | POA: Diagnosis present

## 2012-11-15 DIAGNOSIS — D469 Myelodysplastic syndrome, unspecified: Secondary | ICD-10-CM | POA: Diagnosis present

## 2012-11-15 DIAGNOSIS — A4101 Sepsis due to Methicillin susceptible Staphylococcus aureus: Secondary | ICD-10-CM | POA: Diagnosis present

## 2012-11-15 LAB — COMPREHENSIVE METABOLIC PANEL
ALT: 20 U/L (ref 0–35)
Alkaline Phosphatase: 106 U/L (ref 39–117)
BUN: 13 mg/dL (ref 6–23)
Chloride: 100 mEq/L (ref 96–112)
GFR calc Af Amer: >90 mL/min (ref >90)
Glucose, Bld: 119 mg/dL — ABNORMAL HIGH (ref 70–99)
Potassium: 4.6 mEq/L (ref 3.5–5.1)
Sodium: 136 mEq/L (ref 135–145)
Total Bilirubin: 2.5 mg/dL — ABNORMAL HIGH (ref 0.3–1.2)
Total Protein: 6.6 g/dL (ref 6.0–8.3)

## 2012-11-15 LAB — CBC WITH DIFFERENTIAL/PLATELET
Basophils Relative: 0 % (ref 0–1)
Eosinophils Relative: 1 % (ref 0–5)
HCT: 24 % — ABNORMAL LOW (ref 36.0–46.0)
Hemoglobin: 7.7 g/dL — ABNORMAL LOW (ref 12.0–15.0)
Lymphs Abs: 1.7 10*3/uL (ref 0.7–4.0)
MCH: 29.6 pg (ref 26.0–34.0)
MCV: 92.3 fL (ref 78.0–100.0)
Monocytes Absolute: 0.9 10*3/uL (ref 0.1–1.0)
Monocytes Relative: 4 % (ref 3–12)
Neutro Abs: 20.9 10*3/uL — ABNORMAL HIGH (ref 1.7–7.7)
RBC: 2.6 MIL/uL — ABNORMAL LOW (ref 3.87–5.11)
WBC: 23.7 10*3/uL — ABNORMAL HIGH (ref 4.0–10.5)

## 2012-11-15 LAB — URINALYSIS, ROUTINE W REFLEX MICROSCOPIC
Bilirubin Urine: NEGATIVE
Glucose, UA: NEGATIVE mg/dL
Ketones, ur: NEGATIVE mg/dL
Nitrite: NEGATIVE
Protein, ur: NEGATIVE mg/dL
pH: 5 (ref 5.0–8.0)

## 2012-11-15 LAB — LACTIC ACID, PLASMA: Lactic Acid, Venous: 1.9 mmol/L (ref 0.5–2.2)

## 2012-11-15 LAB — URINE MICROSCOPIC-ADD ON

## 2012-11-15 MED ORDER — LEVOFLOXACIN 750 MG PO TABS: 750.0000 mg | ORAL_TABLET | Freq: Every day | ORAL | Status: DC

## 2012-11-15 MED ORDER — ONDANSETRON HCL 4 MG/2ML IJ SOLN
4.0000 mg | Freq: Once | INTRAMUSCULAR | Status: DC
  Filled 2012-11-15: qty 2

## 2012-11-15 MED ORDER — MORPHINE SULFATE 4 MG/ML IJ SOLN
4.0000 mg | Freq: Once | INTRAMUSCULAR | Status: DC
  Filled 2012-11-15: qty 1

## 2012-11-15 MED ORDER — SODIUM CHLORIDE 0.9 % IV BOLUS (SEPSIS)
1000.0000 mL | Freq: Once | INTRAVENOUS | Status: AC
  Administered 2012-11-15: 1000 mL via INTRAVENOUS

## 2012-11-15 MED ORDER — LEVOFLOXACIN IN D5W 750 MG/150ML IV SOLN
750.0000 mg | Freq: Once | INTRAVENOUS | Status: AC
  Administered 2012-11-15: 750 mg via INTRAVENOUS
  Filled 2012-11-15: qty 150

## 2012-11-15 NOTE — ED Provider Notes (Signed)
History     CSN: [id number]  Arrival date & time 11/15/12  1816   First MD Initiated Contact with Patient 11/15/12 1837      Chief Complaint  Patient presents with  . Fever    (Consider location/radiation/quality/duration/timing/severity/associated sxs/prior treatment) The history is provided by the patient.  TAWSHA TERRERO is a 72 y.o. female history of myelodysplastic syndrome, anxiety, anemia here presenting with fever and chills and weakness. She fell low-grade temperature this afternoon and had a temp of 99.8 at home. She also has some chills and just feels diffusely weak. She has a history of mild dysplastic syndrome and gets s lesions in the past and most recently 2 units on April 21. She was never on chemotherapy. She has several episodes of diarrhea today but no vomiting or urinary symptoms or cough.   Past Medical History  Diagnosis Date  . 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 Anxiety and depression 08/12/2011  . Anemia     Past Surgical History  Procedure Laterality Date  . Neck plastic surgery    . Abdominoplasty/panniculectomy    . Tubal ligation    . Breast reduction surgery    . Appendectomy      History reviewed. No pertinent family history.  History  Substance Use Topics  . Smoking status: Former Smoker    Start date: 04/16/1992  . Smokeless tobacco: Not on file  . Alcohol Use: Not on file    OB History   Grav Para Term Preterm Abortions TAB SAB Ect Mult Living                  Review of Systems  Constitutional: Positive for fever and chills.  All other systems reviewed and are negative.    Allergies  Review of patient's allergies indicates no known allergies.  Home Medications   Current Outpatient Rx  Name  Route  Sig  Dispense  Refill  . aspirin EC 81 MG tablet   Oral   Take 81 mg by mouth daily.         . diphenhydrAMINE (BENADRYL) 25 MG tablet   Oral   Take 12.5 mg by mouth at bedtime as needed  for sleep.          . ergocalciferol (VITAMIN D2) 50000 UNITS capsule   Oral   Take 50,000 Units by mouth 2 (two) times a week. On mondays and fridays         . FLUoxetine (PROZAC) 40 MG capsule   Oral   Take 40 mg by mouth daily before breakfast.          . lidocaine-prilocaine (EMLA) cream   Topical   Apply topically as needed. Apply as directed 1 hour prior to portacath access   30 g   0     BP 146/47  Pulse 83  Temp(Src) 99.8 F (37.7 C) (Oral)  SpO2 96%  Physical Exam  Nursing note and vitals reviewed. Constitutional: She is oriented to person, place, and time. She appears well-developed and well-nourished.  Slightly anxious   HENT:  Head: Normocephalic.  Mouth/Throat: Oropharynx is clear and moist.  Eyes: Conjunctivae are normal. Pupils are equal, round, and reactive to light.  Neck: Normal range of motion. Neck supple.  Cardiovascular: Normal rate, regular rhythm and normal heart sounds.   Pulmonary/Chest: Effort normal and breath sounds normal. No respiratory distress. She has no wheezes. She has no rales.  Abdominal: Soft. Bowel sounds are normal.  Mild LLQ tenderness,  no rebound or guarding. No CVAT   Musculoskeletal: Normal range of motion.  Neurological: She is alert and oriented to person, place, and time.  Skin: Skin is warm and dry.  Psychiatric: She has a normal mood and affect. Her behavior is normal. Judgment and thought content normal.    ED Course  Procedures (including critical care time)  Labs Reviewed  CBC WITH DIFFERENTIAL - Abnormal; Notable for the following:    WBC 23.7 (*)    RBC 2.60 (*)    Hemoglobin 7.7 (*)    HCT 24.0 (*)    RDW 24.6 (*)    Platelets 518 (*)    Neutrophils Relative 88 (*)    Lymphocytes Relative 7 (*)    Neutro Abs 20.9 (*)    All other components within normal limits  COMPREHENSIVE METABOLIC PANEL - Abnormal; Notable for the following:    Glucose, Bld 119 (*)    Total Bilirubin 2.5 (*)    GFR calc non  Af Amer 83 (*)    All other components within normal limits  URINALYSIS, ROUTINE W REFLEX MICROSCOPIC - Abnormal; Notable for the following:    Color, Urine AMBER (*)    Leukocytes, UA TRACE (*)    All other components within normal limits  URINE CULTURE  CULTURE, BLOOD (ROUTINE X 2)  CULTURE, BLOOD (ROUTINE X 2)  LACTIC ACID, PLASMA  URINE MICROSCOPIC-ADD ON   Dg Chest 2 View  11/15/2012  *RADIOLOGY REPORT*  Clinical Data: Fever.  CHEST - 2 VIEW  Comparison: 04/16/2012.  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  No infiltrate, congestive heart failure or pneumothorax.  Heart size within normal limits.  Mild scoliosis and kyphosis thoracic spine.  IMPRESSION: No acute abnormality.   Original Report Authenticated By: Lacy Duverney, M.D.      No diagnosis found.    MDM  ZARRIA TOWELL is a 72 y.o. female here with low grade temp. Likely viral syndrome but will need to r/o UTI vs pneumonia vs blast crisis. Will get labs, UA, CXR. Doesn't appear septic.   9 PM  WBC 24, UA trace leuk but small WBC and asymptomatic. CXR nl. Cultures sent. I called Dr. Beulah Gandy, oncologist, who recommend f/u differential.   10:39 PM Patient has 20% bands and mostly neutrophils. Dr. Beulah Gandy recommends levaquin and will f/u blood cultures. Patient not septic appearing and is comfortable. Will d/c home.       Richardean Canal, MD 11/15/12 2240

## 2012-11-15 NOTE — Progress Notes (Signed)
Brief hematology oncology communication: I was called by the ER to discuss Carolyn Brennan. 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 She's had subjective fever starting today.  Vital signs and labs were reviewed with the ED physician. The patient was not seen personally by me or examined by me.  A: 1F with MDS who has low-grade fevers and is hemodynamically stable. Her chem looks okay, but her wbc is elevated from baseline and has a neutrophilic leukocytosis. She does have 3-6 WBC in her urine with no squams concerning for a possible UTI. Blood cultures were also drawn to work up possible port infection, but these are pending.  P: after discussion with the ED attending (Dr. Silverio Lay), he did not feel she necessarily warranted admission by her clinical presentation alone. I did recommend levofloxacin for empirical treatment of presumed UTI as an outpatient. In the meantime, we'll need to watch her blood cultures. If they show up positive, we will have to call her to discuss adjusting her plan.

## 2012-11-15 NOTE — ED Notes (Signed)
She c/o "feel bad and I had a fever this afternoon, which I never get".  She then tells me she has myelodysplastic dis., for which she sees Dr. Cyndie Chime.  She further tells me she had a (right upper chest) power port inserted April 1st of this year.  She states this port was first utilized April 21 for the receiving of 2 units of PRBC's at our cancer center.  She is in no distress; and denies cough nor any pain.

## 2012-11-16 ENCOUNTER — Encounter (HOSPITAL_COMMUNITY): Payer: Self-pay | Admitting: *Deleted

## 2012-11-16 ENCOUNTER — Inpatient Hospital Stay (HOSPITAL_COMMUNITY)
Admission: EM | Admit: 2012-11-16 | Discharge: 2012-11-20 | DRG: 314 | Disposition: A | Discharge status: Home Health | Payer: Medicare Other | Attending: Emergency Medicine | Admitting: Emergency Medicine

## 2012-11-16 ENCOUNTER — Telehealth: Payer: Self-pay | Admitting: Internal Medicine

## 2012-11-16 ENCOUNTER — Telehealth (HOSPITAL_COMMUNITY): Payer: Self-pay | Admitting: Emergency Medicine

## 2012-11-16 DIAGNOSIS — D649 Anemia, unspecified: Secondary | ICD-10-CM | POA: Diagnosis present

## 2012-11-16 DIAGNOSIS — T80211A Bloodstream infection due to central venous catheter, initial encounter: Secondary | ICD-10-CM | POA: Diagnosis present

## 2012-11-16 DIAGNOSIS — K7689 Other specified diseases of liver: Secondary | ICD-10-CM | POA: Diagnosis present

## 2012-11-16 DIAGNOSIS — Z87891 Personal history of nicotine dependence: Secondary | ICD-10-CM

## 2012-11-16 DIAGNOSIS — D469 Myelodysplastic syndrome, unspecified: Secondary | ICD-10-CM | POA: Diagnosis present

## 2012-11-16 DIAGNOSIS — F329 Major depressive disorder, single episode, unspecified: Secondary | ICD-10-CM | POA: Diagnosis present

## 2012-11-16 DIAGNOSIS — R162 Hepatomegaly with splenomegaly, not elsewhere classified: Secondary | ICD-10-CM | POA: Diagnosis present

## 2012-11-16 DIAGNOSIS — R509 Fever, unspecified: Secondary | ICD-10-CM

## 2012-11-16 DIAGNOSIS — F3289 Other specified depressive episodes: Secondary | ICD-10-CM | POA: Diagnosis present

## 2012-11-16 DIAGNOSIS — B9561 Methicillin susceptible Staphylococcus aureus infection as the cause of diseases classified elsewhere: Secondary | ICD-10-CM | POA: Diagnosis present

## 2012-11-16 DIAGNOSIS — F32A Depression, unspecified: Secondary | ICD-10-CM | POA: Diagnosis present

## 2012-11-16 DIAGNOSIS — E876 Hypokalemia: Secondary | ICD-10-CM | POA: Diagnosis not present

## 2012-11-16 DIAGNOSIS — Z452 Encounter for adjustment and management of vascular access device: Secondary | ICD-10-CM

## 2012-11-16 DIAGNOSIS — C946 Myelodysplastic disease, not classified: Secondary | ICD-10-CM | POA: Diagnosis present

## 2012-11-16 DIAGNOSIS — F411 Generalized anxiety disorder: Secondary | ICD-10-CM | POA: Diagnosis present

## 2012-11-16 DIAGNOSIS — A4101 Sepsis due to Methicillin susceptible Staphylococcus aureus: Secondary | ICD-10-CM | POA: Diagnosis present

## 2012-11-16 DIAGNOSIS — A419 Sepsis, unspecified organism: Secondary | ICD-10-CM | POA: Diagnosis present

## 2012-11-16 DIAGNOSIS — D47Z9 Other specified neoplasms of uncertain behavior of lymphoid, hematopoietic and related tissue: Secondary | ICD-10-CM | POA: Diagnosis present

## 2012-11-16 DIAGNOSIS — Z79899 Other long term (current) drug therapy: Secondary | ICD-10-CM

## 2012-11-16 DIAGNOSIS — R197 Diarrhea, unspecified: Secondary | ICD-10-CM | POA: Diagnosis present

## 2012-11-16 DIAGNOSIS — R7881 Bacteremia: Secondary | ICD-10-CM

## 2012-11-16 LAB — CBC WITH DIFFERENTIAL/PLATELET
Basophils Absolute: 0 10*3/uL (ref 0.0–0.1)
Eosinophils Absolute: 0 10*3/uL (ref 0.0–0.7)
HCT: 18.2 % — ABNORMAL LOW (ref 36.0–46.0)
Lymphs Abs: 0.6 10*3/uL — ABNORMAL LOW (ref 0.7–4.0)
MCH: 29.6 pg (ref 26.0–34.0)
MCHC: 32.4 g/dL (ref 30.0–36.0)
MCV: 91.5 fL (ref 78.0–100.0)
Monocytes Absolute: 0.3 10*3/uL (ref 0.1–1.0)
Neutro Abs: 7.2 10*3/uL (ref 1.7–7.7)
RDW: 25.1 % — ABNORMAL HIGH (ref 11.5–15.5)

## 2012-11-16 LAB — COMPREHENSIVE METABOLIC PANEL
ALT: 44 U/L — ABNORMAL HIGH (ref 0–35)
CO2: 23 mEq/L (ref 19–32)
Calcium: 8.5 mg/dL (ref 8.4–10.5)
GFR calc Af Amer: 83 mL/min — ABNORMAL LOW (ref >90)
GFR calc non Af Amer: 71 mL/min — ABNORMAL LOW (ref >90)
Glucose, Bld: 128 mg/dL — ABNORMAL HIGH (ref 70–99)
Sodium: 133 mEq/L — ABNORMAL LOW (ref 135–145)

## 2012-11-16 MED ORDER — SODIUM CHLORIDE 0.9 % IV SOLN: INTRAVENOUS | Status: DC

## 2012-11-16 MED ORDER — SODIUM CHLORIDE 0.9 % IV BOLUS (SEPSIS)
500.0000 mL | Freq: Once | INTRAVENOUS | Status: AC
  Administered 2012-11-16: 500 mL via INTRAVENOUS

## 2012-11-16 MED ORDER — ONDANSETRON HCL 4 MG/2ML IJ SOLN
4.0000 mg | Freq: Once | INTRAMUSCULAR | Status: AC
  Administered 2012-11-16: 4 mg via INTRAVENOUS
  Filled 2012-11-16: qty 2

## 2012-11-16 NOTE — Telephone Encounter (Signed)
Patient called at 7:50 stating that Patty informed her that all of her recent blood cultures collected yesterday in the emergency room were positive.   Patient states she is on an oral medication.   PLAN: I instructed patient to report to emergency room for intravenous antibiotics and for hospital admission.   She was agreeable to this plan and her neighbor will bring her to the emergency room.  She was also informed to call ems if she is unable to obtain transport or if there is acute change in her physical state.  She voiced understanding.

## 2012-11-16 NOTE — ED Notes (Signed)
IV team at bedside to access to patient's port-a-cath.

## 2012-11-16 NOTE — ED Notes (Signed)
Call from [facility].  Preliminary blood cx x 4 bottles growing gram (+) cocci in clusters.  Pt dcd on Levaquin.  Chart to MD for review.

## 2012-11-16 NOTE — ED Provider Notes (Signed)
History     CSN: [id number]  Arrival date & time 11/16/12  2125   First MD Initiated Contact with Patient 11/16/12 2139      Chief Complaint  Patient presents with  . positive blood cultures      HPI Pt treated yesterday fever/shaking; blood work done; pt called today and told to return to the hospital for positive blood cultures.  Patient denies abdominal pain or headache.  Has had diarrhea and nausea vomiting.  Has history of anemia and transfusions.  Patient denies melena or hematochezia.  Past Medical History  Diagnosis Date  . 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 Anxiety and depression 08/12/2011  . Anemia     Past Surgical History  Procedure Laterality Date  . Neck plastic surgery    . Abdominoplasty/panniculectomy    . Tubal ligation    . Breast reduction surgery    . Appendectomy      No family history on file.  History  Substance Use Topics  . Smoking status: Former Smoker    Start date: 04/16/1992  . Smokeless tobacco: Not on file  . Alcohol Use: Not on file    OB History   Grav Para Term Preterm Abortions TAB SAB Ect Mult Living                  Review of Systems All other systems reviewed and are negative Allergies  Review of patient's allergies indicates no known allergies.  Home Medications   Current Outpatient Rx  Name  Route  Sig  Dispense  Refill  . aspirin EC 81 MG tablet   Oral   Take 81 mg by mouth daily.         . diphenhydrAMINE (BENADRYL) 25 MG tablet   Oral   Take 12.5 mg by mouth at bedtime as needed for sleep.          . ergocalciferol (VITAMIN D2) 50000 UNITS capsule   Oral   Take 50,000 Units by mouth 2 (two) times a week. On mondays and fridays         . FLUoxetine (PROZAC) 40 MG capsule   Oral   Take 40 mg by mouth daily before breakfast.          . levofloxacin (LEVAQUIN) 750 MG tablet   Oral   Take 1 tablet (750 mg total) by mouth daily. X 7 days   7 tablet   0   .  lidocaine-prilocaine (EMLA) cream   Topical   Apply topically as needed. Apply as directed 1 hour prior to portacath access   30 g   0     BP 120/43  Pulse 89  Temp(Src) 100.8 F (38.2 C) (Oral)  Resp 18  SpO2 95%  Physical Exam  Nursing note and vitals reviewed. Constitutional: She is oriented to person, place, and time. She appears well-developed and well-nourished. No distress.  HENT:  Head: Normocephalic and atraumatic.  Eyes: Pupils are equal, round, and reactive to light.  Neck: Normal range of motion.  Cardiovascular: Normal rate and intact distal pulses.   Pulmonary/Chest: No respiratory distress.  Abdominal: Normal appearance. She exhibits no distension. There is no tenderness. There is no rebound.  Musculoskeletal: Normal range of motion.  Neurological: She is alert and oriented to person, place, and time. No cranial nerve deficit.  Skin: Skin is warm and dry. No rash noted. There is pallor.  Psychiatric: She has a normal mood and affect. Her behavior is normal.  ED Course  Procedures (including critical care time)  Labs Reviewed  CBC WITH DIFFERENTIAL - Abnormal; Notable for the following:    RBC 1.99 (*)    Hemoglobin 5.9 (*)    HCT 18.2 (*)    RDW 25.1 (*)    Neutrophils Relative 89 (*)    Lymphocytes Relative 7 (*)    Lymphs Abs 0.6 (*)    All other components within normal limits  COMPREHENSIVE METABOLIC PANEL - Abnormal; Notable for the following:    Sodium 133 (*)    Glucose, Bld 128 (*)    AST 61 (*)    ALT 44 (*)    Total Bilirubin 3.1 (*)    GFR calc non Af Amer 71 (*)    GFR calc Af Amer 83 (*)    All other components within normal limits  CULTURE, BLOOD (ROUTINE X 2)  CULTURE, BLOOD (ROUTINE X 2)  LACTIC ACID, PLASMA  PREPARE RBC (CROSSMATCH)  TYPE AND SCREEN   Dg Chest 2 View  11/15/2012  *RADIOLOGY REPORT*  Clinical Data: Fever.  CHEST - 2 VIEW  Comparison: 04/16/2012.  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   No infiltrate, congestive heart failure or pneumothorax.  Heart size within normal limits.  Mild scoliosis and kyphosis thoracic spine.  IMPRESSION: No acute abnormality.   Original Report Authenticated By: Lacy Duverney, M.D.      1. Bacteremia   2. Febrile illness       MDM   I discussed case with oncology who recommended continuing antibiotics and admission.  Also recommended drawing blood cultures through the port.       Nelia Shi, MD 11/16/12 [phone]

## 2012-11-16 NOTE — ED Notes (Signed)
Chart reviewed by Dr Silverio Lay.  "Need to call patient back for IV abx"  Called and spoke to pt.  Pt informed to return to the ED, will need IV abx.  Pt advised to call oncology on call and advise them.  Pt verbalizes understanding.

## 2012-11-16 NOTE — ED Notes (Signed)
Pt treated yesterday fever/shaking; blood work done; pt called today and told to return to the hospital for positive blood cultures

## 2012-11-17 ENCOUNTER — Inpatient Hospital Stay (HOSPITAL_COMMUNITY): Payer: Medicare Other

## 2012-11-17 DIAGNOSIS — A4901 Methicillin susceptible Staphylococcus aureus infection, unspecified site: Secondary | ICD-10-CM

## 2012-11-17 DIAGNOSIS — E876 Hypokalemia: Secondary | ICD-10-CM

## 2012-11-17 DIAGNOSIS — A419 Sepsis, unspecified organism: Secondary | ICD-10-CM

## 2012-11-17 DIAGNOSIS — F419 Anxiety disorder, unspecified: Secondary | ICD-10-CM | POA: Diagnosis present

## 2012-11-17 DIAGNOSIS — F32A Depression, unspecified: Secondary | ICD-10-CM | POA: Diagnosis present

## 2012-11-17 DIAGNOSIS — R7881 Bacteremia: Secondary | ICD-10-CM | POA: Diagnosis present

## 2012-11-17 DIAGNOSIS — F341 Dysthymic disorder: Secondary | ICD-10-CM

## 2012-11-17 DIAGNOSIS — R162 Hepatomegaly with splenomegaly, not elsewhere classified: Secondary | ICD-10-CM | POA: Diagnosis present

## 2012-11-17 LAB — CBC
HCT: 20.1 % — ABNORMAL LOW (ref 36.0–46.0)
HCT: 22.3 % — ABNORMAL LOW (ref 36.0–46.0)
MCHC: 33.3 g/dL (ref 30.0–36.0)
MCHC: 35 g/dL (ref 30.0–36.0)
MCV: 90.1 fL (ref 78.0–100.0)
MCV: 89.9 fL (ref 78.0–100.0)
Platelets: 249 10*3/uL (ref 150–400)
Platelets: 240 10*3/uL (ref 150–400)
RDW: 22.3 % — ABNORMAL HIGH (ref 11.5–15.5)
RDW: 20.7 % — ABNORMAL HIGH (ref 11.5–15.5)

## 2012-11-17 LAB — BASIC METABOLIC PANEL
BUN: 16 mg/dL (ref 6–23)
CO2: 24 mEq/L (ref 19–32)
Calcium: 7.8 mg/dL — ABNORMAL LOW (ref 8.4–10.5)
Chloride: 102 mEq/L (ref 96–112)
Creatinine, Ser: 0.8 mg/dL (ref 0.50–1.10)
GFR calc Af Amer: 83 mL/min — ABNORMAL LOW (ref >90)

## 2012-11-17 LAB — URINE CULTURE: Colony Count: 8000

## 2012-11-17 LAB — OCCULT BLOOD X 1 CARD TO LAB, STOOL: Fecal Occult Bld: NEGATIVE

## 2012-11-17 MED ORDER — CEFAZOLIN SODIUM-DEXTROSE 2-3 GM-% IV SOLR
2.0000 g | Freq: Three times a day (TID) | INTRAVENOUS | Status: DC
  Administered 2012-11-17 – 2012-11-20 (×10): 2 g via INTRAVENOUS
  Filled 2012-11-17 (×11): qty 50

## 2012-11-17 MED ORDER — ALUM & MAG HYDROXIDE-SIMETH 200-200-20 MG/5ML PO SUSP: 30.0000 mL | Freq: Four times a day (QID) | ORAL | Status: DC | PRN

## 2012-11-17 MED ORDER — ZOLPIDEM TARTRATE 5 MG PO TABS: 5.0000 mg | ORAL_TABLET | Freq: Every evening | ORAL | Status: DC | PRN

## 2012-11-17 MED ORDER — LORAZEPAM 0.5 MG PO TABS
0.5000 mg | ORAL_TABLET | Freq: Once | ORAL | Status: AC
  Administered 2012-11-17: 0.5 mg via ORAL
  Filled 2012-11-17: qty 1

## 2012-11-17 MED ORDER — VANCOMYCIN HCL IN DEXTROSE 1-5 GM/200ML-% IV SOLN
1000.0000 mg | Freq: Two times a day (BID) | INTRAVENOUS | Status: DC
  Administered 2012-11-17 (×2): 1000 mg via INTRAVENOUS
  Filled 2012-11-17 (×4): qty 200

## 2012-11-17 MED ORDER — VANCOMYCIN HCL 500 MG IV SOLR
1.0000 mg | Freq: Two times a day (BID) | INTRAVENOUS | Status: DC
  Filled 2012-11-17: qty 1

## 2012-11-17 MED ORDER — ONDANSETRON HCL 4 MG/2ML IJ SOLN: 4.0000 mg | Freq: Four times a day (QID) | INTRAMUSCULAR | Status: DC | PRN

## 2012-11-17 MED ORDER — LIDOCAINE-PRILOCAINE 2.5-2.5 % EX CREA
TOPICAL_CREAM | CUTANEOUS | Status: DC | PRN
  Filled 2012-11-17: qty 5

## 2012-11-17 MED ORDER — ACETAMINOPHEN 650 MG RE SUPP: 650.0000 mg | Freq: Four times a day (QID) | RECTAL | Status: DC | PRN

## 2012-11-17 MED ORDER — FLUOXETINE HCL 20 MG PO CAPS
40.0000 mg | ORAL_CAPSULE | Freq: Every day | ORAL | Status: DC
  Administered 2012-11-17 – 2012-11-20 (×3): 40 mg via ORAL
  Filled 2012-11-17 (×6): qty 2

## 2012-11-17 MED ORDER — SODIUM CHLORIDE 0.9 % IJ SOLN
3.0000 mL | Freq: Two times a day (BID) | INTRAMUSCULAR | Status: DC
  Administered 2012-11-17 – 2012-11-18 (×2): 3 mL via INTRAVENOUS

## 2012-11-17 MED ORDER — LORAZEPAM 1 MG PO TABS: 1.0000 mg | ORAL_TABLET | Freq: Once | ORAL | Status: DC

## 2012-11-17 MED ORDER — ONDANSETRON HCL 4 MG PO TABS: 4.0000 mg | ORAL_TABLET | Freq: Four times a day (QID) | ORAL | Status: DC | PRN

## 2012-11-17 MED ORDER — ASPIRIN EC 81 MG PO TBEC
81.0000 mg | DELAYED_RELEASE_TABLET | Freq: Every day | ORAL | Status: DC
  Administered 2012-11-17 – 2012-11-19 (×2): 81 mg via ORAL
  Filled 2012-11-17 (×4): qty 1

## 2012-11-17 MED ORDER — NALOXONE HCL 0.4 MG/ML IJ SOLN
INTRAMUSCULAR | Status: AC
  Filled 2012-11-17: qty 1

## 2012-11-17 MED ORDER — VITAMIN D (ERGOCALCIFEROL) 1.25 MG (50000 UNIT) PO CAPS
50000.0000 [IU] | ORAL_CAPSULE | ORAL | Status: DC
  Filled 2012-11-17: qty 1

## 2012-11-17 MED ORDER — ERGOCALCIFEROL 1.25 MG (50000 UT) PO CAPS: 50000.0000 [IU] | ORAL_CAPSULE | ORAL | Status: DC

## 2012-11-17 MED ORDER — HYDROCODONE-ACETAMINOPHEN 5-325 MG PO TABS: 1.0000 | ORAL_TABLET | ORAL | Status: DC | PRN

## 2012-11-17 MED ORDER — ZOLPIDEM TARTRATE 5 MG PO TABS
5.0000 mg | ORAL_TABLET | Freq: Every evening | ORAL | Status: DC | PRN
  Administered 2012-11-17 – 2012-11-19 (×3): 5 mg via ORAL
  Filled 2012-11-17 (×3): qty 1

## 2012-11-17 MED ORDER — PIPERACILLIN-TAZOBACTAM 3.375 G IVPB 30 MIN
3.3750 g | Freq: Once | INTRAVENOUS | Status: AC
  Administered 2012-11-17: 3.375 g via INTRAVENOUS
  Filled 2012-11-17: qty 50

## 2012-11-17 MED ORDER — MUPIROCIN 2 % EX OINT
1.0000 "application " | TOPICAL_OINTMENT | Freq: Two times a day (BID) | CUTANEOUS | Status: DC
  Administered 2012-11-17 – 2012-11-20 (×5): 1 via NASAL
  Filled 2012-11-17: qty 22

## 2012-11-17 MED ORDER — DIPHENHYDRAMINE HCL 25 MG PO TABS: 12.5000 mg | ORAL_TABLET | Freq: Every evening | ORAL | Status: DC | PRN

## 2012-11-17 MED ORDER — PIPERACILLIN-TAZOBACTAM 3.375 G IVPB
3.3750 g | Freq: Three times a day (TID) | INTRAVENOUS | Status: DC
  Administered 2012-11-17: 3.375 g via INTRAVENOUS
  Filled 2012-11-17 (×2): qty 50

## 2012-11-17 MED ORDER — PNEUMOCOCCAL VAC POLYVALENT 25 MCG/0.5ML IJ INJ
0.5000 mL | INJECTION | INTRAMUSCULAR | Status: AC
  Administered 2012-11-17: 0.5 mL via INTRAMUSCULAR
  Filled 2012-11-17: qty 0.5

## 2012-11-17 MED ORDER — ACETAMINOPHEN 325 MG PO TABS
650.0000 mg | ORAL_TABLET | Freq: Four times a day (QID) | ORAL | Status: DC | PRN
  Administered 2012-11-17: 650 mg via ORAL
  Filled 2012-11-17: qty 2

## 2012-11-17 MED ORDER — VANCOMYCIN HCL IN DEXTROSE 750-5 MG/150ML-% IV SOLN
750.0000 mg | Freq: Two times a day (BID) | INTRAVENOUS | Status: DC
  Filled 2012-11-17: qty 150

## 2012-11-17 MED ORDER — POTASSIUM CHLORIDE IN NACL 40-0.9 MEQ/L-% IV SOLN
INTRAVENOUS | Status: AC
  Administered 2012-11-17 – 2012-11-18 (×2): via INTRAVENOUS
  Filled 2012-11-17 (×2): qty 1000

## 2012-11-17 MED ORDER — CHLORHEXIDINE GLUCONATE CLOTH 2 % EX PADS
6.0000 | MEDICATED_PAD | Freq: Every day | CUTANEOUS | Status: DC
  Administered 2012-11-17 – 2012-11-20 (×4): 6 via TOPICAL

## 2012-11-17 MED ORDER — HYDROMORPHONE HCL PF 1 MG/ML IJ SOLN: 1.0000 mg | INTRAMUSCULAR | Status: DC | PRN

## 2012-11-17 MED ORDER — VANCOMYCIN HCL IN DEXTROSE 1-5 GM/200ML-% IV SOLN
1000.0000 mg | Freq: Once | INTRAVENOUS | Status: AC
  Administered 2012-11-17: 1000 mg via INTRAVENOUS
  Filled 2012-11-17: qty 200

## 2012-11-17 MED ORDER — IOHEXOL 300 MG/ML  SOLN
50.0000 mL | Freq: Once | INTRAMUSCULAR | Status: AC | PRN
Start: 2012-11-17 — End: 2012-11-17

## 2012-11-17 MED ORDER — POTASSIUM CHLORIDE CRYS ER 20 MEQ PO TBCR
40.0000 meq | EXTENDED_RELEASE_TABLET | Freq: Once | ORAL | Status: AC
  Administered 2012-11-17: 40 meq via ORAL
  Filled 2012-11-17: qty 2

## 2012-11-17 MED ORDER — ONDANSETRON HCL 4 MG/2ML IJ SOLN: 4.0000 mg | Freq: Three times a day (TID) | INTRAMUSCULAR | Status: DC | PRN

## 2012-11-17 NOTE — Progress Notes (Signed)
ANTIBIOTIC CONSULT NOTE - INITIAL  Pharmacy Consult for Vancomycin and Zosyn  Indication: [facility] blood cultures  No Known Allergies  Patient Measurements: Height: 5\' 2"  (157.5 cm) Weight: 170 lb (77.111 kg) IBW/kg (Calculated) : 50.1 Adjusted Body Weight:   Vital Signs: Temp: 98.8 F (37.1 C) (05/05 0209) Temp src: Oral (05/05 0209) BP: 123/50 mmHg (05/05 0209) Pulse Rate: 88 (05/05 0209) Intake/Output from previous day: 05/04 0701 - 05/05 0700 In: 12.5 [Blood:12.5] Out: -  Intake/Output from this shift: Total I/O In: 12.5 [Blood:12.5] Out: -   Labs:  Recent Labs  11/15/12 2000 11/16/12 2215  WBC 23.7* 8.1  HGB 7.7* 5.9*  PLT 518* 307  CREATININE 0.73 0.80   Estimated Creatinine Clearance: 60.2 ml/min (by C-G formula based on Cr of 0.8). No results found for this basename: VANCOTROUGH, VANCOPEAK, VANCORANDOM, GENTTROUGH, GENTPEAK, GENTRANDOM, TOBRATROUGH, TOBRAPEAK, TOBRARND, AMIKACINPEAK, AMIKACINTROU, AMIKACIN,  in the last 72 hours   Microbiology: Recent Results (from the past 720 hour(s))  CULTURE, BLOOD (ROUTINE X 2)     Status: None   Collection Time    11/15/12  8:41 PM      Result Value Range Status   Specimen Description BLOOD RIGHT ANTECUBITAL   Final   Special Requests BOTTLES DRAWN AEROBIC AND ANAEROBIC 4CC   Final   Culture  Setup Time 11/16/2012 04:25   Final   Culture     Final   Value: GRAM POSITIVE COCCI IN CLUSTERS     Note: Gram Stain Report Called to,Read Back By and Verified With: PATTY CLARK 11/16/12 @ 7:16PM BY RUSCA.   Report Status PENDING   Incomplete  CULTURE, BLOOD (ROUTINE X 2)     Status: None   Collection Time    11/15/12  8:55 PM      Result Value Range Status   Specimen Description BLOOD RIGHT FOREARM   Final   Special Requests BOTTLES DRAWN AEROBIC AND ANAEROBIC 10CC   Final   Culture  Setup Time 11/16/2012 04:25   Final   Culture     Final   Value: GRAM POSITIVE COCCI IN CLUSTERS     Note: Gram Stain Report Called to,Read  Back By and Verified With: PATTY CLARK 11/16/12 @ 7:16PM BY RUSCA.   Report Status PENDING   Incomplete    Medical History: Past Medical History  Diagnosis Date  . 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 Anxiety and depression 08/12/2011  . Anemia     Medications:  Anti-infectives   Start     Dose/Rate Route Frequency Ordered Stop   11/17/12 1000  vancomycin (VANCOCIN) IVPB 750 mg/150 ml premix     750 mg 150 mL/hr over 60 Minutes Intravenous Every 12 hours 11/17/12 0212     11/17/12 0800  piperacillin-tazobactam (ZOSYN) IVPB 3.375 g     3.375 g 12.5 mL/hr over 240 Minutes Intravenous Every 8 hours 11/17/12 0159     11/17/12 0200  piperacillin-tazobactam (ZOSYN) IVPB 3.375 g     3.375 g 100 mL/hr over 30 Minutes Intravenous  Once 11/17/12 0159     11/17/12 0045  vancomycin (VANCOCIN) IVPB 1000 mg/200 mL premix     1,000 mg 200 mL/hr over 60 Minutes Intravenous  Once 11/17/12 0041 11/17/12 0158     Assessment: Patient with [facility] blood cultures.  First dose of antibiotics already given.  Goal of Therapy:  Vancomycin trough level 15-20 mcg/ml Zosyn based on renal function   Plan:  Measure antibiotic drug levels at steady state  Follow up culture results Vancomycin 750mg  iv q12hr Zosyn 3.375g IV Q8H infused over 4hrs.   Darlina Guys, Jacquenette Shone Crowford 11/17/2012,2:13 AM

## 2012-11-17 NOTE — Progress Notes (Signed)
Patient ID: Carolyn Brennan, female   DOB: 02/19/1941, 72 y.o.   MRN: [id number]                                                                          Request received for port a cath removal in pt with hx of staph aureus bacteremia. Port was placed on 10/14/12. Additional PMH as below. Exam: pt awake/alert; chest- CTA bilat; heart-RRR; abd- soft,+BS, mildly tender rt lat abd/epig regions; ext- FROM, no sig edema. Rt IJ port site accessed; clean and dry, NT, minimal ecchymosis, no drainage. Filed Vitals:   11/17/12 0718 11/17/12 0800 11/17/12 0900 11/17/12 1000  BP: 143/49 131/44 126/84 128/47  Pulse:  73 25 25  Temp: 98.4 F (36.9 C)     TempSrc: Oral     Resp: 20 22 21 22   Height:      Weight:      SpO2:  97% 80% 91%   Past Medical History  Diagnosis Date  . 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 Anxiety and depression 08/12/2011  . Anemia    Past Surgical History  Procedure Laterality Date  . Neck plastic surgery    . Abdominoplasty/panniculectomy    . Tubal ligation    . Breast reduction surgery    . Appendectomy     Ct Abdomen Pelvis Wo Contrast  11/17/2012  *RADIOLOGY REPORT*  Clinical Data: Abdominal pain.  Mild proliferative disorder. History uterine cancer and appendectomy.  Question colitis.  CT ABDOMEN AND PELVIS WITHOUT CONTRAST  Technique:  Multidetector CT imaging of the abdomen and pelvis was performed following the standard protocol without intravenous contrast.  Comparison: 10/06/2008  Findings: Focal airspace opacity in the lower lingula is incompletely visualized and may be related to atelectasis or infiltrate.  Liver measures 19.6 cm in cranial caudal length, enlarged.  No focal abnormalities seen in the liver or spleen on this study performed without intravenous contrast material.  The spleen is 17.2 cm in cranial caudal length, enlarged.  The stomach, duodenum, pancreas, and adrenal glands are unremarkable.  The gallbladder is distended but  otherwise normal in appearance.  There is no intra or extrahepatic biliary duct dilatation.  No evidence for stones in either kidney.  There is mild fullness of the right ureter, especially distally.  No ureteral or bladder stones.  No abdominal aortic aneurysm.  No free fluid or lymphadenopathy in the abdomen.  Imaging through the pelvis shows no free intraperitoneal fluid.  No pelvic sidewall lymphadenopathy.  Bladder is unremarkable.  Despite the reported history of uterine cancer, the uterus is visualized in situ and has normal uninfused CT imaging features.  No evidence for adnexal mass.  No substantial diverticular change in the colon.  No evidence for colonic diverticulitis.  No colonic wall thickening to suggest colitis.  Terminal ileum is unremarkable.  Insert at the  Bone windows reveal no worrisome lytic or sclerotic osseous lesions.  L4 compression fracture noted and was also present on a lumbar spine MRI from [place] [facility] dated 03/21/2010.  IMPRESSION: Hepatosplenomegaly, new since 10/06/2008 in this patient with history of myeloproliferative disorder.  Otherwise no acute findings in the abdomen or pelvis. Specifically, no evidence for colitis.  Lingular  atelectasis or infiltrate.  Two-view chest x-ray from 2 days ago showed no abnormality at this location suggesting that it may be secondary to interval development of atelectasis.  Follow-up two-view chest x-ray could be performed to further evaluate.   Original Report Authenticated By: Kennith Center, M.D.    Dg Chest 2 View  11/15/2012  *RADIOLOGY REPORT*  Clinical Data: Fever.  CHEST - 2 VIEW  Comparison: 04/16/2012.  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  No infiltrate, congestive heart failure or pneumothorax.  Heart size within normal limits.  Mild scoliosis and kyphosis thoracic spine.  IMPRESSION: No acute abnormality.   Original Report Authenticated By: Lacy Duverney, M.D.   Results for orders  placed during the hospital encounter of 11/16/12  MRSA PCR SCREENING      Result Value Range   MRSA by PCR POSITIVE (*) NEGATIVE  CBC WITH DIFFERENTIAL      Result Value Range   WBC 8.1  4.0 - 10.5 K/uL   RBC 1.99 (*) 3.87 - 5.11 MIL/uL   Hemoglobin 5.9 (*) 12.0 - 15.0 g/dL   HCT 21.3 (*) 08.6 - 57.8 %   MCV 91.5  78.0 - 100.0 fL   MCH 29.6  26.0 - 34.0 pg   MCHC 32.4  30.0 - 36.0 g/dL   RDW 46.9 (*) 62.9 - 52.8 %   Platelets 307  150 - 400 K/uL   Neutrophils Relative 89 (*) 43 - 77 %   Lymphocytes Relative 7 (*) 12 - 46 %   Monocytes Relative 4  3 - 12 %   Eosinophils Relative 0  0 - 5 %   Basophils Relative 0  0 - 1 %   Neutro Abs 7.2  1.7 - 7.7 K/uL   Lymphs Abs 0.6 (*) 0.7 - 4.0 K/uL   Monocytes Absolute 0.3  0.1 - 1.0 K/uL   Eosinophils Absolute 0.0  0.0 - 0.7 K/uL   Basophils Absolute 0.0  0.0 - 0.1 K/uL   RBC Morphology POLYCHROMASIA PRESENT     WBC Morphology MILD LEFT SHIFT (1-5% METAS, OCC MYELO, OCC BANDS)     Smear Review LARGE PLATELETS PRESENT    COMPREHENSIVE METABOLIC PANEL      Result Value Range   Sodium 133 (*) 135 - 145 mEq/L   Potassium 3.6  3.5 - 5.1 mEq/L   Chloride 100  96 - 112 mEq/L   CO2 23  19 - 32 mEq/L   Glucose, Bld 128 (*) 70 - 99 mg/dL   BUN 18  6 - 23 mg/dL   Creatinine, Ser 4.13  0.50 - 1.10 mg/dL   Calcium 8.5  8.4 - 24.4 mg/dL   Total Protein 6.0  6.0 - 8.3 g/dL   Albumin 3.5  3.5 - 5.2 g/dL   AST 61 (*) 0 - 37 U/L   ALT 44 (*) 0 - 35 U/L   Alkaline Phosphatase 78  39 - 117 U/L   Total Bilirubin 3.1 (*) 0.3 - 1.2 mg/dL   GFR calc non Af Amer 71 (*) >90 mL/min   GFR calc Af Amer 83 (*) >90 mL/min  LACTIC ACID, PLASMA      Result Value Range   Lactic Acid, Venous 1.4  0.5 - 2.2 mmol/L  BASIC METABOLIC PANEL      Result Value Range   Sodium 134 (*) 135 - 145 mEq/L   Potassium 3.4 (*) 3.5 - 5.1 mEq/L   Chloride 102  96 - 112  mEq/L   CO2 24  19 - 32 mEq/L   Glucose, Bld 126 (*) 70 - 99 mg/dL   BUN 16  6 - 23 mg/dL   Creatinine,  Ser 1.61  0.50 - 1.10 mg/dL   Calcium 7.8 (*) 8.4 - 10.5 mg/dL   GFR calc non Af Amer 71 (*) >90 mL/min   GFR calc Af Amer 83 (*) >90 mL/min  CBC      Result Value Range   WBC 7.3  4.0 - 10.5 K/uL   RBC 2.23 (*) 3.87 - 5.11 MIL/uL   Hemoglobin 6.7 (*) 12.0 - 15.0 g/dL   HCT 09.6 (*) 04.5 - 40.9 %   MCV 90.1  78.0 - 100.0 fL   MCH 30.0  26.0 - 34.0 pg   MCHC 33.3  30.0 - 36.0 g/dL   RDW 81.1 (*) 91.4 - 78.2 %   Platelets 249  150 - 400 K/uL  OCCULT BLOOD X 1 CARD TO LAB, STOOL      Result Value Range   Fecal Occult Bld NEGATIVE  NEGATIVE  CBC      Result Value Range   WBC 6.8  4.0 - 10.5 K/uL   RBC 2.48 (*) 3.87 - 5.11 MIL/uL   Hemoglobin 7.8 (*) 12.0 - 15.0 g/dL   HCT 95.6 (*) 21.3 - 08.6 %   MCV 89.9  78.0 - 100.0 fL   MCH 31.5  26.0 - 34.0 pg   MCHC 35.0  30.0 - 36.0 g/dL   RDW 57.8 (*) 46.9 - 62.9 %   Platelets 240  150 - 400 K/uL  PREPARE RBC (CROSSMATCH)      Result Value Range   Order Confirmation ORDER PROCESSED BY BLOOD BANK    TYPE AND SCREEN      Result Value Range   ABO/RH(D) A POS     Antibody Screen NEG     Sample Expiration 11/20/2012     Unit Number [id number]     Blood Component Type RED CELLS,LR     Unit division 00     Status of Unit ISSUED     Transfusion Status OK TO TRANSFUSE     Crossmatch Result Compatible     Unit Number [id number]     Blood Component Type RED CELLS,LR     Unit division 00     Status of Unit ISSUED     Transfusion Status OK TO TRANSFUSE     Crossmatch Result Compatible     A/P: Pt with hx myeloproliferative disorder with dysplastic features, s/p rt IJ port a cath placement 10/14/12; now with staph aureus bacteremia. Plan is for port a cath removal on 5/6. Details/risks of procedure d/w pt with her understanding and consent.

## 2012-11-17 NOTE — Progress Notes (Addendum)
[facility] PROGRESS NOTE  CAPRIA CARTAYA YOV:[id number] DOB: 08/03/1940 DOA: 11/16/2012 PCP: Hollice Espy, MD  Primary Hematologist: Dr. Cephas Darby.  Brief narrative 72 year old female with history of MDS, transfusion dependent for anemia, anxiety and depression, port a cath placed on 10/14/12, was first seen at [facility] ED on 11/15/12 for low-grade fevers, chills, generalized weakness and episodes of vomiting and diarrhea, which were attributed to viral syndrome. After discussing with a hematologist on call, patient was discharged from ED on Levaquin. ED called her back on 11/16/12 due to preliminary blood cultures positive for gram-positive cocci in clusters. In ED, hemoglobin 5.9, white blood cell 23.7. She denies any bleeding or black stools. Hospitalist admission requested.   Assessment/Plan: 1. Staphylococcus aureus bacteremia: possibly secondary to line (port a cath) sepsis. Blood cultures x2 from 5/3+. Dr. Luciana Axe, ID consultation appreciated. Continue Vancomycin & adding Cefazolin until sensitivities back.  TTE and if negative proceed with TEE, removal of port a Cath (IR consulted), repeat BCx after port removal. May replace port a cath/picc in 72 hours after repeat blood cultures if they remain negative  2. 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 Followup CBC in a.m. No signs and symptoms of bleeding. Dr. Cyndie Chime input appreciated 3. Anxiety and depression: Continue home medications. Stable 4. Hypokalemia: Replete by mouth and follow BMP in a.m. 5. Diarrhea: Multiple watery BM's today. C. difficile PCR pending -follow up results. 6. Hepatosplenomegaly on CT: Possibly from MDS. Check ferritin levels. Mild transaminitis. Follow hepatic panel in a.m.   Code Status: Full  Family Communication: Discussed with patient  Disposition Plan: Transfer to medical bed. Remains inpatient. Home when medically stable.   Consultants:  Infectious disease    Hematology  Procedures:  None  Antibiotics:  IV vancomycin 5/4 >  IV cefazolin 5/5 >   HPI/Subjective: Patient's feels better. Some low back pain- attributes to laying on bed. Denies nausea, vomiting or pain.   Objective: Filed Vitals:   11/17/12 0718 11/17/12 0800 11/17/12 0900 11/17/12 1000  BP: 143/49 131/44 126/84 128/47  Pulse:  73 25 25  Temp: 98.4 F (36.9 C)     TempSrc: Oral     Resp: 20 22 21 22   Height:      Weight:      SpO2:  97% 80% 91%    Intake/Output Summary (Last 24 hours) at 11/17/12 1340 Last data filed at 11/17/12 1117  Gross per 24 hour  Intake 2277.5 ml  Output    641 ml  Net 1636.5 ml   Filed Weights   11/17/12 0041 11/17/12 0134  Weight: 77.111 kg (170 lb) 79.6 kg (175 lb 7.8 oz)    Exam:   General exam: Comfortable.  Respiratory system: Clear. No increased work of breathing.Port-A-Cath right anterior chest: Site looks clean without acute findings   Cardiovascular system: S1 & S2 heard, RRR. No JVD, murmurs, gallops, clicks or pedal edema.Telemetry: Sinus rhythm   Gastrointestinal system: Abdomen is nondistended, soft and nontender. Normal bowel sounds heard.  Central nervous system: Alert and oriented. No focal neurological deficits.  Extremities: Symmetric 5 x 5 power.   Data Reviewed: Basic Metabolic Panel:  Recent Labs Lab 11/15/12 2000 11/16/12 2215 11/17/12 0540  NA 136 133* 134*  K 4.6 3.6 3.4*  CL 100 100 102  CO2 25 23 24   GLUCOSE 119* 128* 126*  BUN 13 18 16   CREATININE 0.73 0.80 0.80  CALCIUM 9.6 8.5 7.8*   Liver Function Tests:  Recent Labs Lab 11/15/12 2000 11/16/12 2215  AST 21 61*  ALT 20 44*  ALKPHOS 106 78  BILITOT 2.5* 3.1*  PROT 6.6 6.0  ALBUMIN 4.1 3.5   No results found for this basename: LIPASE, AMYLASE,  in the last 168 hours No results found for this basename: AMMONIA,  in the last 168 hours CBC:  Recent Labs Lab 11/15/12 2000 11/16/12 2215 11/17/12 0540 11/17/12 1115   WBC 23.7* 8.1 7.3 6.8  NEUTROABS 20.9* 7.2  --   --   HGB 7.7* 5.9* 6.7* 7.8*  HCT 24.0* 18.2* 20.1* 22.3*  MCV 92.3 91.5 90.1 89.9  PLT 518* 307 249 240   Cardiac Enzymes: No results found for this basename: CKTOTAL, CKMB, CKMBINDEX, TROPONINI,  in the last 168 hours BNP (last 3 results)  Recent Labs  02/21/12 1608  PROBNP 277.6*   CBG: No results found for this basename: GLUCAP,  in the last 168 hours  Recent Results (from the past 240 hour(s))  URINE CULTURE     Status: None   Collection Time    11/15/12  7:18 PM      Result Value Range Status   Specimen Description URINE, CLEAN CATCH   Final   Special Requests NONE   Final   Culture  Setup Time 11/16/2012 05:07   Final   Colony Count 8,000 COLONIES/ML   Final   Culture INSIGNIFICANT GROWTH   Final   Report Status 11/17/2012 FINAL   Final  CULTURE, BLOOD (ROUTINE X 2)     Status: None   Collection Time    11/15/12  8:41 PM      Result Value Range Status   Specimen Description BLOOD RIGHT ANTECUBITAL   Final   Special Requests BOTTLES DRAWN AEROBIC AND ANAEROBIC 4CC   Final   Culture  Setup Time 11/16/2012 04:25   Final   Culture     Final   Value: STAPHYLOCOCCUS AUREUS     Note: Gram Stain Report Called to,Read Back By and Verified With: PATTY CLARK 11/16/12 @ 7:16PM BY RUSCA.   Report Status PENDING   Incomplete  CULTURE, BLOOD (ROUTINE X 2)     Status: None   Collection Time    11/15/12  8:55 PM      Result Value Range Status   Specimen Description BLOOD RIGHT FOREARM   Final   Special Requests BOTTLES DRAWN AEROBIC AND ANAEROBIC 10CC   Final   Culture  Setup Time 11/16/2012 04:25   Final   Culture     Final   Value: STAPHYLOCOCCUS AUREUS     Note: RIFAMPIN AND GENTAMICIN SHOULD NOT BE USED AS SINGLE DRUGS FOR TREATMENT OF STAPH INFECTIONS.     Note: Gram Stain Report Called to,Read Back By and Verified With: PATTY CLARK 11/16/12 @ 7:16PM BY RUSCA.   Report Status PENDING   Incomplete  MRSA PCR SCREENING      Status: Abnormal   Collection Time    11/17/12  1:55 AM      Result Value Range Status   MRSA by PCR POSITIVE (*) NEGATIVE Final   Comment:            The GeneXpert MRSA Assay (FDA     approved for NASAL specimens     only), is one component of a     comprehensive MRSA colonization     surveillance program. It is not     intended to diagnose MRSA     infection nor  to guide or     monitor treatment for     MRSA infections.     RESULT CALLED TO, READ BACK BY AND VERIFIED WITH:     C AYERS AT 0403 ON 05.05.2014 BY NBROOKS     Studies: Ct Abdomen Pelvis Wo Contrast  11/17/2012  *RADIOLOGY REPORT*  Clinical Data: Abdominal pain.  Mild proliferative disorder. History uterine cancer and appendectomy.  Question colitis.  CT ABDOMEN AND PELVIS WITHOUT CONTRAST  Technique:  Multidetector CT imaging of the abdomen and pelvis was performed following the standard protocol without intravenous contrast.  Comparison: 10/06/2008  Findings: Focal airspace opacity in the lower lingula is incompletely visualized and may be related to atelectasis or infiltrate.  Liver measures 19.6 cm in cranial caudal length, enlarged.  No focal abnormalities seen in the liver or spleen on this study performed without intravenous contrast material.  The spleen is 17.2 cm in cranial caudal length, enlarged.  The stomach, duodenum, pancreas, and adrenal glands are unremarkable.  The gallbladder is distended but otherwise normal in appearance.  There is no intra or extrahepatic biliary duct dilatation.  No evidence for stones in either kidney.  There is mild fullness of the right ureter, especially distally.  No ureteral or bladder stones.  No abdominal aortic aneurysm.  No free fluid or lymphadenopathy in the abdomen.  Imaging through the pelvis shows no free intraperitoneal fluid.  No pelvic sidewall lymphadenopathy.  Bladder is unremarkable.  Despite the reported history of uterine cancer, the uterus is visualized in situ and has  normal uninfused CT imaging features.  No evidence for adnexal mass.  No substantial diverticular change in the colon.  No evidence for colonic diverticulitis.  No colonic wall thickening to suggest colitis.  Terminal ileum is unremarkable.  Insert at the  Bone windows reveal no worrisome lytic or sclerotic osseous lesions.  L4 compression fracture noted and was also present on a lumbar spine MRI from [place] [facility] dated 03/21/2010.  IMPRESSION: Hepatosplenomegaly, new since 10/06/2008 in this patient with history of myeloproliferative disorder.  Otherwise no acute findings in the abdomen or pelvis. Specifically, no evidence for colitis.  Lingular atelectasis or infiltrate.  Two-view chest x-ray from 2 days ago showed no abnormality at this location suggesting that it may be secondary to interval development of atelectasis.  Follow-up two-view chest x-ray could be performed to further evaluate.   Original Report Authenticated By: Kennith Center, M.D.    Dg Chest 2 View  11/15/2012  *RADIOLOGY REPORT*  Clinical Data: Fever.  CHEST - 2 VIEW  Comparison: 04/16/2012.  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  No infiltrate, congestive heart failure or pneumothorax.  Heart size within normal limits.  Mild scoliosis and kyphosis thoracic spine.  IMPRESSION: No acute abnormality.   Original Report Authenticated By: Lacy Duverney, M.D.      Additional labs:   Scheduled Meds: . aspirin EC  81 mg Oral Daily  .  ceFAZolin (ANCEF) IV  2 g Intravenous Q8H  . Chlorhexidine Gluconate Cloth  6 each Topical Q0600  . ergocalciferol  50,000 Units Oral 2 times weekly  . FLUoxetine  40 mg Oral QAC breakfast  . LORazepam  0.5 mg Oral Once  . mupirocin ointment  1 application Nasal BID  . potassium chloride  40 mEq Oral Once  . sodium chloride  3 mL Intravenous Q12H  . vancomycin  1,000 mg Intravenous Q12H   Continuous Infusions: . 0.9 % NaCl with  KCl 40 mEq / L       Principal Problem:   Staphylococcus aureus bacteremia Active Problems:   Anemia   MDS/MPN (myelodysplastic/myeloproliferative neoplasms)   Hypokalemia   Anxiety and depression   Hepatosplenomegaly on CT abdomen 11/17/12    Time spent: 40 minutes.    [facility]  [facility] Pager [phone].   If 8PM-8AM, please contact night-coverage at [email or web address], password [email or web address] 11/17/2012, 1:40 PM  LOS: 1 day

## 2012-11-17 NOTE — Progress Notes (Signed)
CARE MANAGEMENT NOTE 11/17/2012  Patient:  Carolyn Brennan, Carolyn Brennan   Account Number:  [id number]  Date Initiated:  11/17/2012  Documentation initiated by:  Allissa Albright  Subjective/Objective Assessment:   pt admitted with sepsis and postitive bld cultures     Action/Plan:   from home   Anticipated DC Date:  11/20/2012   Anticipated DC Plan:  HOME/SELF CARE  In-house referral  NA      DC Planning Services  NA      [facility] Choice  NA   Choice offered to / List presented to:  NA      DME agency  NA     HH arranged  NA      HH agency  NA   Status of service:  In process, will continue to follow Medicare Important Message given?  NA - LOS <3 / Initial given by admissions (If response is "NO", the following Medicare IM given date fields will be blank) Date Medicare IM given:   Date Additional Medicare IM given:    Discharge Disposition:    Per UR Regulation:  Reviewed for med. necessity/level of care/duration of stay  If discussed at Long Length of Stay Meetings, dates discussed:    Comments:  [id number] Earlene Plater, RN, BSN, CCM:  CHART REVIEWED AND UPDATED.  Next chart review due on [id number]. NO DISCHARGE NEEDS PRESENT AT THIS TIME. CASE MANAGEMENT [phone]

## 2012-11-17 NOTE — H&P (Signed)
History and Physical       Hospital Admission Note Date: 11/17/2012  Patient name: Carolyn Brennan Medical record number: [id number] Date of birth: 09-Feb-1941 Age: 72 y.o. Gender: female PCP: Hollice Espy, MD  Primary oncologist: Dr. Cyndie Chime  Chief Complaint:  Called for positive blood cultures  HPI: Patient is a 72 year old female with history of myelodysplastic syndrome, anemia who routinely receives blood transfusions every 5 weeks presented to ED yesterday with fevers, chills and generalized weakness. Patient also had episodes of vomiting and diarrhea. Blood cultures were sent and EDP, Dr Silverio Lay discussed with the on call oncologist, Dr. Beulah Gandy, patient was treated for presumed UTI with Levaquin and discharged home from ED. Patient was called today as her preliminary blood cultures 4/4 were positive for gram-positive cocci in clusters ED workup showed that her hemoglobin was down to 5.9 (from 7.7 yesterday) although patient denied any hematochezia, melena, hematemesis. Her white count was 23.7 yesterday and 8.1 today.  Review of Systems:  Constitutional: + fever, chills, diaphoresis, poor appetite and fatigue.  HEENT: Denies photophobia, eye pain, redness, hearing loss, ear pain, congestion, sore throat, rhinorrhea, sneezing, mouth sores, trouble swallowing, neck pain, neck stiffness and tinnitus.   Respiratory: Denies SOB, DOE, cough, chest tightness,  and wheezing.   Cardiovascular: Denies chest pain, palpitations and leg swelling.  Gastrointestinal: Patient still had several episodes of diarrhea today with vomiting Genitourinary: Denies dysuria, urgency, frequency, hematuria, flank pain and difficulty urinating.  Musculoskeletal: Denies myalgias, back pain, joint swelling, arthralgias and gait problem.  Skin: Denies pallor, rash and wound.  Neurological: Endorses generalized weakness, denies any seizures, syncope, numbness  and headaches.  Hematological: Denies adenopathy. Easy bruising, personal or family bleeding history  Psychiatric/Behavioral: Denies suicidal ideation, mood changes, confusion, nervousness, sleep disturbance and agitation  Past Medical History: Past Medical History  Diagnosis Date  . 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 Anxiety and depression 08/12/2011  . Anemia    Past Surgical History  Procedure Laterality Date  . Neck plastic surgery    . Abdominoplasty/panniculectomy    . Tubal ligation    . Breast reduction surgery    . Appendectomy      Medications: Prior to Admission medications   Medication Sig Start Date End Date Taking? Authorizing Provider  aspirin EC 81 MG tablet Take 81 mg by mouth daily.   Yes Historical Provider, MD  diphenhydrAMINE (BENADRYL) 25 MG tablet Take 12.5 mg by mouth at bedtime as needed for sleep.    Yes Historical Provider, MD  ergocalciferol (VITAMIN D2) 50000 UNITS capsule Take 50,000 Units by mouth 2 (two) times a week. On mondays and fridays   Yes Historical Provider, MD  FLUoxetine (PROZAC) 40 MG capsule Take 40 mg by mouth daily before breakfast.    Yes Historical Provider, MD  levofloxacin (LEVAQUIN) 750 MG tablet Take 1 tablet (750 mg total) by mouth daily. X 7 days 11/15/12  Yes Richardean Canal, MD  lidocaine-prilocaine (EMLA) cream Apply topically as needed. Apply as directed 1 hour prior to portacath access 11/03/12  Yes Levert Feinstein, MD    Allergies:  No Known Allergies  Social History:  reports that she has quit smoking. She started smoking about 20 years ago. She does not have any smokeless tobacco history on file. Her alcohol and drug histories are not on file.  Family History: No family history on file.  Physical Exam: Blood pressure 120/43, pulse 89, temperature 100.8 F (38.2 C), temperature source Oral, resp. rate 18,  SpO2 95.00%. General: Alert, awake, oriented x3, in no acute distress. HEENT:  normocephalic, atraumatic, anicteric sclera, pink conjunctiva, pupils equal and reactive to light and accomodation, oropharynx clear Neck: supple, no masses or lymphadenopathy, no goiter, no bruits  Heart: Regular rate and rhythm, without murmurs, rubs or gallops. Lungs: Clear to auscultation bilaterally, no wheezing, rales or rhonchi. Abdomen: Soft, nontender, nondistended, positive bowel sounds, no masses. Extremities: No clubbing, cyanosis or edema with positive pedal pulses. Neuro: Grossly intact, no focal neurological deficits, strength 5/5 upper and lower extremities bilaterally Psych: alert and oriented x 3, normal mood and affect Skin: no rashes or lesions, warm and dry   LABS on Admission:  Basic Metabolic Panel:  Recent Labs Lab 11/15/12 2000 11/16/12 2215  NA 136 133*  K 4.6 3.6  CL 100 100  CO2 25 23  GLUCOSE 119* 128*  BUN 13 18  CREATININE 0.73 0.80  CALCIUM 9.6 8.5   Liver Function Tests:  Recent Labs Lab 11/15/12 2000 11/16/12 2215  AST 21 61*  ALT 20 44*  ALKPHOS 106 78  BILITOT 2.5* 3.1*  PROT 6.6 6.0  ALBUMIN 4.1 3.5   No results found for this basename: LIPASE, AMYLASE,  in the last 168 hours No results found for this basename: AMMONIA,  in the last 168 hours CBC:  Recent Labs Lab 11/15/12 2000 11/16/12 2215  WBC 23.7* 8.1  NEUTROABS 20.9* 7.2  HGB 7.7* 5.9*  HCT 24.0* 18.2*  MCV 92.3 91.5  PLT 518* 307     Radiological Exams on Admission: Dg Chest 2 View  11/15/2012  *RADIOLOGY REPORT*  Clinical Data: Fever.  CHEST - 2 VIEW  Comparison: 04/16/2012.  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  No infiltrate, congestive heart failure or pneumothorax.  Heart size within normal limits.  Mild scoliosis and kyphosis thoracic spine.  IMPRESSION: No acute abnormality.   Original Report Authenticated By: Lacy Duverney, M.D.     Assessment/Plan Principal Problem:   Bacteremia due to Gram-positive bacteria: Unclear etiology.  UA yesterday did not show any UTI, chest x-ray is negative for any acute abnormality. Chest port for access was placed recently last month, no pus or drainage or significant tenderness noted. There is some erythema at the area. - 2 sets of blood cultures were drawn in the ED today, Will place on IV vancomycin and Zosyn for now, follow cultures and sensitivities - Ordered a CT abdomen and pelvis to rule out any colitis, stool studies, FOBT. May need ID consultation for further workup.    Active Problems:   Anemia: - type and screen and transfuse 2 units of packed RBCs    MDS/MPN (myelodysplastic/myeloproliferative neoplasms) - Will consult with hematology in a.m.   DVT prophylaxis: SCD's   CODE STATUS: Full code   Further plan will depend as patient's clinical course evolves and further radiologic and laboratory data become available.   Time Spent on Admission: 1 hour  Lundon Rosier M.D. [facility] 11/17/2012, 12:39 AM Pager: [phone]  If 7PM-7AM, please contact night-coverage [email or web address] Password [email or web address]

## 2012-11-17 NOTE — Plan of Care (Signed)
Problem: Phase I Progression Outcomes Goal: OOB as tolerated unless otherwise ordered Outcome: Progressing Pt using bedpan frequently with some incontinence noted.  Pt remained in bed for some rest during shift.

## 2012-11-17 NOTE — Consult Note (Signed)
[facility]     Reason for Consult: Staph aureus bacteremia    Referring Physician: Dr. Bennie Pierini  Principal Problem:   Bacteremia due to Gram-positive bacteria Active Problems:   Anemia   MDS/MPN (myelodysplastic/myeloproliferative neoplasms)   .  ceFAZolin (ANCEF) IV  2 g Intravenous Q8H  . Chlorhexidine Gluconate Cloth  6 each Topical Q0600  . FLUoxetine  40 mg Oral QAC breakfast  . mupirocin ointment  1 application Nasal BID  . sodium chloride  3 mL Intravenous Q12H  . vancomycin  1,000 mg Intravenous Q12H    Recommendations: Vancomycin and cefazolin pending sensitivities TTE, TEE if negative TTE for endocarditis Removal of port a cath Repeat blood cultures after cath removal May replace port a cath/picc in 72 hours after repeat blood cultures if they remain negative   Assessment: She came in with fever, chills, recent port a cath placement on 4/1.  Now with Staph aureus bacteremia.  WBC improved. Not immunosuppressed.    Antibiotics: Vancomycin and zosyn day 1 Cefazolin day 0  HPI: Carolyn Brennan is a 72 y.o. female with myelodesplastic syndrome with associated anemia and blood transfusion dependent with a port a cath that was placed on 4/1 presented with fever and chills with vomiting and diarrhea.  She was initially treated for presumed UTI with levaquin but subsequent blood cultures grew GPC in clusters and now are positive for Staph aureus.  Her initial WBC was 23.7 which has responded to IV antibiotic therapy.  She continues to have significant episodes of watery diarrhea and febrile this am.  She now feels better overall though diarrhea  Is persistent.  Her cath site did have some edema and mild tenderness that resolved.     Review of Systems: Pertinent items are noted in HPI.  Past Medical History  Diagnosis Date  . 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 Anxiety and depression 08/12/2011  . Anemia       History  Substance Use Topics  . Smoking status: Former Smoker    Start date: 04/16/1992  . Smokeless tobacco: Not on file  . Alcohol Use: Not on file    No family history on file. No Known Allergies  OBJECTIVE: Blood pressure 126/84, pulse 25, temperature 98.4 F (36.9 C), temperature source Oral, resp. rate 21, height 5\' 2"  (1.575 m), weight 175 lb 7.8 oz (79.6 kg), SpO2 80.00%. General: Awake, alert, oriented x 3, nad Skin: no rashes, cath site with no surrounding erythema.  Lungs: CTA B Cor: RRR with 1/6 SEM Abdomen: soft, hyperactive bowel sounds, non tender Ext: no edema   Microbiology: Recent Results (from the past 240 hour(s))  URINE CULTURE     Status: None   Collection Time    11/15/12  7:18 PM      Result Value Range Status   Specimen Description URINE, CLEAN CATCH   Final   Special Requests NONE   Final   Culture  Setup Time 11/16/2012 05:07   Final   Colony Count 8,000 COLONIES/ML   Final   Culture INSIGNIFICANT GROWTH   Final   Report Status 11/17/2012 FINAL   Final  CULTURE, BLOOD (ROUTINE X 2)     Status: None   Collection Time    11/15/12  8:41 PM      Result Value Range Status   Specimen Description BLOOD RIGHT ANTECUBITAL   Final   Special Requests BOTTLES DRAWN AEROBIC AND ANAEROBIC 4CC   Final   Culture  Setup Time 11/16/2012 04:25   Final   Culture     Final   Value: STAPHYLOCOCCUS AUREUS     Note: Gram Stain Report Called to,Read Back By and Verified With: PATTY CLARK 11/16/12 @ 7:16PM BY RUSCA.   Report Status PENDING   Incomplete  CULTURE, BLOOD (ROUTINE X 2)     Status: None   Collection Time    11/15/12  8:55 PM      Result Value Range Status   Specimen Description BLOOD RIGHT FOREARM   Final   Special Requests BOTTLES DRAWN AEROBIC AND ANAEROBIC 10CC   Final   Culture  Setup Time 11/16/2012 04:25   Final   Culture     Final   Value: STAPHYLOCOCCUS AUREUS     Note: RIFAMPIN AND GENTAMICIN SHOULD NOT BE USED AS SINGLE DRUGS FOR  TREATMENT OF STAPH INFECTIONS.     Note: Gram Stain Report Called to,Read Back By and Verified With: PATTY CLARK 11/16/12 @ 7:16PM BY RUSCA.   Report Status PENDING   Incomplete  MRSA PCR SCREENING     Status: Abnormal   Collection Time    11/17/12  1:55 AM      Result Value Range Status   MRSA by PCR POSITIVE (*) NEGATIVE Final   Comment:            The GeneXpert MRSA Assay (FDA     approved for NASAL specimens     only), is one component of a     comprehensive MRSA colonization     surveillance program. It is not     intended to diagnose MRSA     infection nor to guide or     monitor treatment for     MRSA infections.     RESULT CALLED TO, READ BACK BY AND VERIFIED WITH:     C AYERS AT 0403 ON 05.05.2014 BY Franz Dell, MD [facility] [phone] pager  [phone] cell 11/17/2012, 11:31 AM

## 2012-11-17 NOTE — Progress Notes (Signed)
  Echocardiogram 2D Echocardiogram has been performed.  Carolyn Brennan 11/17/2012, 4:43 PM

## 2012-11-17 NOTE — Progress Notes (Signed)
This note also relates to the following rows which could not be included: Pulse Rate - Cannot attach notes to unvalidated device data ECG Heart Rate - Cannot attach notes to unvalidated device data Resp - Cannot attach notes to unvalidated device data   [facility] NP notified of increased temp. Order received to give 650mg  po tylenol and continue transfusion.  No other signs of transfusion reaction. Pt feels no different from start of transfusion.

## 2012-11-17 NOTE — Progress Notes (Signed)
Vancomycin and Zosyn Consult  Patient currently on Vancomycin 750 mg IV q12h and Zosyn 3.375 gm IV q8h for GPC bacteremia and UTI - pending speciation and sensitivities.  Scr 0.8 for CG CrCl 61 ml/min and normalized CrCl 71 ml/min.  Plan: Change Vancomycin to 1gm IV q12h. Continue Zosyn as ordered. Pharmacy will f/u cultures  Geoffry Paradise, PharmD, BCPS Pager: [phone] 8:04 AM Pharmacy #: [id number]

## 2012-11-17 NOTE — Progress Notes (Signed)
ANTIBIOTIC CONSULT NOTE - INITIAL  Pharmacy Consult for Ancef Indication: staph aureus bacteremia  No Known Allergies  Patient Measurements: Height: 5\' 2"  (157.5 cm) Weight: 175 lb 7.8 oz (79.6 kg) IBW/kg (Calculated) : 50.1  Vital Signs: Temp: 98.4 F (36.9 C) (05/05 0718) Temp src: Oral (05/05 0718) BP: 126/84 mmHg (05/05 0900) Pulse Rate: 25 (05/05 0900) Intake/Output from previous day: 05/04 0701 - 05/05 0700 In: 1702.5 [P.O.:240; I.V.:875; Blood:375; IV Piggyback:212.5] Out: 290 [Urine:290] Intake/Output from this shift: Total I/O In: 125 [I.V.:125] Out: 200 [Urine:200]  Labs:  Recent Labs  11/15/12 2000 11/16/12 2215 11/17/12 0540  WBC 23.7* 8.1 7.3  HGB 7.7* 5.9* 6.7*  PLT 518* 307 249  CREATININE 0.73 0.80 0.80   Estimated Creatinine Clearance: 61.2 ml/min (by C-G formula based on Cr of 0.8). No results found for this basename: VANCOTROUGH, VANCOPEAK, VANCORANDOM, GENTTROUGH, GENTPEAK, GENTRANDOM, TOBRATROUGH, TOBRAPEAK, TOBRARND, AMIKACINPEAK, AMIKACINTROU, AMIKACIN,  in the last 72 hours    Medical History: Past Medical History  Diagnosis Date  . 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 Anxiety and depression 08/12/2011  . Anemia     Assessment: HPI: 45 yof with h/o myelodysplastic syndrome, anemia with routine blood transfusions q5weeks presented 5/3 with fever/chills/weakness. Patient was discharged home on Levaquin for ?UTI. Blood cx on 5/3 returned positive for GPC in clusters, patient called back 5/4 for IV antibiotics. Patient with R chest port for ~1 month now.   Today is D#1 Vanc, Zosyn.  5/3 blood cultures and urine cx with staph aureus - pending senstivities. Repeat blood culture and urine culture pending. Tmax 101.3, WBC wnl, Scr 0.8 for CG CrCl 61 ml/min and normalized CrCl of 71 ml/min.   ID is on board, ordered to discontinue Zosyn and change to Ancef while continue Vancomycin (will narrow down when sensitivity  returns)   Plan:   D/C Zosyn  Ancef 2gm IV q8h  F/u sensitivitie and narrow antibiotics (currently 'double covering').   Geoffry Paradise, PharmD, BCPS Pager: [phone] 11:07 AM Pharmacy #: [id number]

## 2012-11-17 NOTE — Progress Notes (Signed)
Progress Note:  Subjective: 72 year old woman well known to me with a myeloproliferative disorder with dysplastic features. She tested negative for the Jack-2 mutation. 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 Most recent bone marrow done 01/25/2011 shows normal cytogenetics. No evidence for the 5Q deletion. This marrow changed little compared with the initial study done at diagnosis in April of 2008. Marrow hypercellular for age 44% with prominent erythroid proliferation, megakaryocyte proliferation with the large and abnormal forms. No excess blasts. Normal maturation in the myeloid series. dysplastic changes in the erythroid series.  She had no convincing response to erythrocyte stimulating agents. She did not tolerate Aranesp. She tolerated Epogen but had no response despite dose increases.  She is now requiring periodic blood transfusions. Hemoglobin has fallen as low as 5.7 g. She has exhausted  peripheral access and a Port-A-Cath infusion device was placed on April 1 and first used on April 21 for a blood transfusion. Hemoglobin 5.7, white count 12,000, platelet count  363,000 that day.  She called the on-call physician Saturday, May 3 complaining of chills, anorexia, low-grade fever less than 100. She was advised to go to the emergency department for evaluation. Examination was unremarkable. Hemoglobin was 7.7. Temperature 99.8. White count was elevated compared with her baseline at 24,000 with a left shift. Chest x-ray negative. portacath in good position. No infiltrates. There were 3-6 white cells in the urine. She did not appear toxic. ED physician consulted with the oncologist on call. Decision was made to discharge the patient on oral Levaquin pending results of blood cultures. Unfortunately both sets of blood cultures were positive for gram positives in clusters. Results returned the next day on May 4. Patient was contacted and admission was advised.  She had a positive MRSA  screen. Identification of the organism growing in the blood cultures is still outstanding. She has not had any obvious risk factors for this infection. No recent dental work. on her skin. No erythema or tenderness at the Port-A-Cath site.     Vitals: Filed Vitals:   11/17/12 0800  BP: 131/44  Pulse: 73  Temp:  maximum temperature since admission recorded at O3 20 6 AM May 5 at 101.3 axillary   Resp: 22   Wt Readings from Last 3 Encounters:  11/17/12 175 lb 7.8 oz (79.6 kg)  10/14/12 170 lb (77.111 kg)  08/11/12 173 lb 12.8 oz (78.835 kg)     PHYSICAL EXAM:  General well-nourished Caucasian woman no acute distress Head: WNL Eyes: WNL Throat: No erythema or exudate Neck: Full range of motion Lymph Nodes: No adenopathy Lungs: Clear to auscultation resonant to percussion Breasts:  Cardiac: regular rhythm. I cannot appreciate a murmur. Abdominal: Soft, nontender, no mass, no organomegaly Extremities: No edema, no calf tenderness Vascular: No cyanosis  Neurologic grossly normal. She is alert and oriented. Motor strength 5 over 5. Skin: No rash, abrasions, ecchymosis.   Labs:   Recent Labs  11/16/12 2215 11/17/12 0540  WBC 8.1 7.3  HGB 5.9* 6.7*  HCT 18.2* 20.1*  PLT 307 249    Recent Labs  11/16/12 2215 11/17/12 0540  NA 133* 134*  K 3.6 3.4*  CL 100 102  CO2 23 24  GLUCOSE 128* 126*  BUN 18 16  CREATININE 0.80 0.80  CALCIUM 8.5 7.8*      Images Studies/Results:   Dg Chest 2 View  11/15/2012  *RADIOLOGY REPORT*  Clinical Data: Fever.  CHEST - 2 VIEW  Comparison: 04/16/2012.  Findings: Right central  line tip proximal superior vena cava.  No infiltrate, congestive heart failure or pneumothorax.  Heart size within normal limits.  Mild scoliosis and kyphosis thoracic spine.  IMPRESSION: No acute abnormality.   Original Report Authenticated By: Lacy Duverney, M.D.      Patient Active Problem List   Diagnosis Date Noted  . MDS/MPN  (myelodysplastic/myeloproliferative neoplasms) 08/12/2011    Priority: High  . Bacteremia due to Gram-positive bacteria 11/17/2012  . Anemia 07/16/2011    Assessment : #1. Gram-positive sepsis. Port-A-Cath just put in 4 weeks ago. It does not appear grossly infected but must be considered as a potential source of infection in the absence of any breaks of the skin or recent dental work. She is not the usual immunocompromised host. She has not been on chemotherapy. Her white blood count is elevated not decreased. I think we may be able to salvage the Port-A-Cath if she has a prompt response to antibiotics. Recommendation: Management discussed with hospital physician. Infectious disease opinion will be obtained for further recommendations on the type and duration of antibiotics and potential to salvage her Port-A-Cath. Anticipate she will need 4 weeks of antibiotic therapy much of which can be given as an outpatient once we identified the organism.  #2. Myeloproliferative disorder with dysplastic features She remains transfusion dependent. Transfusion frequency becoming increased now every 4-6 weeks. When asked him to see iron overload with significant increase in her ferritin. I will likely need to start her on an iron binding agent soon.  #3. Chronic anxiety and depression  #4. 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      Mellisa Arshad M 11/17/2012, 8:33 AM

## 2012-11-18 ENCOUNTER — Inpatient Hospital Stay (HOSPITAL_COMMUNITY): Payer: Medicare Other

## 2012-11-18 LAB — COMPREHENSIVE METABOLIC PANEL
ALT: 40 U/L — ABNORMAL HIGH (ref 0–35)
AST: 44 U/L — ABNORMAL HIGH (ref 0–37)
Albumin: 3.1 g/dL — ABNORMAL LOW (ref 3.5–5.2)
Calcium: 8.1 mg/dL — ABNORMAL LOW (ref 8.4–10.5)
Creatinine, Ser: 0.73 mg/dL (ref 0.50–1.10)
Sodium: 136 mEq/L (ref 135–145)
Total Protein: 5.6 g/dL — ABNORMAL LOW (ref 6.0–8.3)

## 2012-11-18 LAB — TYPE AND SCREEN

## 2012-11-18 LAB — FECAL LACTOFERRIN, QUANT: Fecal Lactoferrin: POSITIVE

## 2012-11-18 LAB — CULTURE, BLOOD (ROUTINE X 2)

## 2012-11-18 LAB — CBC
MCH: 29.1 pg (ref 26.0–34.0)
MCV: 88.8 fL (ref 78.0–100.0)
Platelets: 247 10*3/uL (ref 150–400)
RDW: 22 % — ABNORMAL HIGH (ref 11.5–15.5)

## 2012-11-18 LAB — URINE CULTURE: Culture: NO GROWTH

## 2012-11-18 MED ORDER — FENTANYL CITRATE 0.05 MG/ML IJ SOLN
INTRAMUSCULAR | Status: AC | PRN
  Administered 2012-11-18: 25 ug via INTRAVENOUS
  Administered 2012-11-18: 50 ug via INTRAVENOUS

## 2012-11-18 MED ORDER — FENTANYL CITRATE 0.05 MG/ML IJ SOLN
INTRAMUSCULAR | Status: AC
  Filled 2012-11-18: qty 6

## 2012-11-18 MED ORDER — MIDAZOLAM HCL 2 MG/2ML IJ SOLN
INTRAMUSCULAR | Status: AC | PRN
  Administered 2012-11-18: 0.5 mg via INTRAVENOUS
  Administered 2012-11-18: 1 mg via INTRAVENOUS

## 2012-11-18 MED ORDER — MIDAZOLAM HCL 2 MG/2ML IJ SOLN
INTRAMUSCULAR | Status: AC
  Filled 2012-11-18: qty 6

## 2012-11-18 NOTE — Progress Notes (Signed)
[facility] PROGRESS NOTE  Carolyn Brennan WUJ:[id number] DOB: 1941-03-12 DOA: 11/16/2012 PCP: Hollice Espy, MD  Primary Hematologist: Dr. Cephas Darby.  Brief narrative 72 year old female with history of MDS, transfusion dependent for anemia, anxiety and depression, port a cath placed on 10/14/12, was first seen at [facility] ED on 11/15/12 for low-grade fevers, chills, generalized weakness and episodes of vomiting and diarrhea, which were attributed to viral syndrome. After discussing with a hematologist on call, patient was discharged from ED on Levaquin. ED called her back on 11/16/12 due to preliminary blood cultures positive for gram-positive cocci in clusters. In ED, hemoglobin 5.9, white blood cell 23.7. She denies any bleeding or black stools. Hospitalist admission requested.   Assessment/Plan: 1. MSSA bacteremia: possibly secondary to line (port a cath) sepsis. Blood cultures x2 from 5/3+. Dr. Luciana Axe, ID consultation appreciated. Vancomycin discontinued. Continue cefazolin-duration dependent on TEE.  TTE negative for vegetations. [facility] cardiology consulted for TEE. Porta cath removed by IR on 5/6. PICC okay once blood cultures negative at 72 hours-drawn on 5/5. 2. 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 Followup CBC in a.m. No signs and symptoms of bleeding. Dr. Cyndie Chime input appreciated 3. Anxiety and depression: Continue home medications. Stable 4. Hypokalemia:  repleted.  5. Diarrhea:  improved. C. difficile PCR -sample apparently rejected by lab. CT abdomen negative for acute findings. ? Acute viral GE. 6. Hepatosplenomegaly on CT: Possibly from MDS. Ferritin: 4945. Mild transaminitis-unchanged. Management per hematology.   Code Status: Full  Family Communication: Discussed with patient  Disposition Plan: Remains inpatient. Home when medically stable.   Consultants:  Infectious disease   Hematology  IR  [facility]  Procedures:  Port a cath removed 5/6  Antibiotics:  IV vancomycin 5/4 >5/6  IV cefazolin 5/5 >   HPI/Subjective: Denies complaints and eager to go home.  Objective: Filed Vitals:   11/18/12 1417 11/18/12 1422 11/18/12 1427 11/18/12 1432  BP: 137/62 129/60 128/64 133/61  Pulse: 79 78 80 80  Temp:      TempSrc:      Resp: 15 17 17 14   Height:      Weight:      SpO2: 98% 99% 99% 99%    Intake/Output Summary (Last 24 hours) at 11/18/12 1512 Last data filed at 11/18/12 0707  Gross per 24 hour  Intake 1212.5 ml  Output   1000 ml  Net  212.5 ml   Filed Weights   11/17/12 0041 11/17/12 0134  Weight: 77.111 kg (170 lb) 79.6 kg (175 lb 7.8 oz)    Exam:   General exam: Comfortable.  Respiratory system: Clear. No increased work of breathing.Port-A-Cath right anterior chest: Site looks clean without acute findings-was seen before port removed today.   Cardiovascular system: S1 & S2 heard, RRR. No JVD, murmurs, gallops, clicks or pedal edema.  Gastrointestinal system: Abdomen is nondistended, soft and nontender. Normal bowel sounds heard.  Central nervous system: Alert and oriented. No focal neurological deficits.  Extremities: Symmetric 5 x 5 power.   Data Reviewed: Basic Metabolic Panel:  Recent Labs Lab 11/15/12 2000 11/16/12 2215 11/17/12 0540 11/18/12 0356  NA 136 133* 134* 136  K 4.6 3.6 3.4* 4.0  CL 100 100 102 106  CO2 25 23 24 22   GLUCOSE 119* 128* 126* 98  BUN 13 18 16 15   CREATININE 0.73 0.80 0.80 0.73  CALCIUM 9.6 8.5 7.8* 8.1*   Liver Function Tests:  Recent Labs Lab 11/15/12  2000 11/16/12 2215 11/18/12 0356  AST 21 61* 44*  ALT 20 44* 40*  ALKPHOS 106 78 100  BILITOT 2.5* 3.1* 1.4*  PROT 6.6 6.0 5.6*  ALBUMIN 4.1 3.5 3.1*   No results found for this basename: LIPASE, AMYLASE,  in the last 168 hours No results found for this basename: AMMONIA,  in the last 168 hours CBC:  Recent Labs Lab 11/15/12 2000  11/16/12 2215 11/17/12 0540 11/17/12 1115 11/18/12 0356  WBC 23.7* 8.1 7.3 6.8 7.5  NEUTROABS 20.9* 7.2  --   --   --   HGB 7.7* 5.9* 6.7* 7.8* 7.8*  HCT 24.0* 18.2* 20.1* 22.3* 23.8*  MCV 92.3 91.5 90.1 89.9 88.8  PLT 518* 307 249 240 247   Cardiac Enzymes: No results found for this basename: CKTOTAL, CKMB, CKMBINDEX, TROPONINI,  in the last 168 hours BNP (last 3 results)  Recent Labs  02/21/12 1608  PROBNP 277.6*   CBG: No results found for this basename: GLUCAP,  in the last 168 hours  Recent Results (from the past 240 hour(s))  URINE CULTURE     Status: None   Collection Time    11/15/12  7:18 PM      Result Value Range Status   Specimen Description URINE, CLEAN CATCH   Final   Special Requests NONE   Final   Culture  Setup Time 11/16/2012 05:07   Final   Colony Count 8,000 COLONIES/ML   Final   Culture INSIGNIFICANT GROWTH   Final   Report Status 11/17/2012 FINAL   Final  CULTURE, BLOOD (ROUTINE X 2)     Status: None   Collection Time    11/15/12  8:41 PM      Result Value Range Status   Specimen Description BLOOD RIGHT ANTECUBITAL   Final   Special Requests BOTTLES DRAWN AEROBIC AND ANAEROBIC 4CC   Final   Culture  Setup Time 11/16/2012 04:25   Final   Culture     Final   Value: STAPHYLOCOCCUS AUREUS     Note: SUSCEPTIBILITIES PERFORMED ON PREVIOUS CULTURE WITHIN THE LAST 5 DAYS.     Note: Gram Stain Report Called to,Read Back By and Verified With: PATTY CLARK 11/16/12 @ 7:16PM BY RUSCA.   Report Status 11/18/2012 FINAL   Final  CULTURE, BLOOD (ROUTINE X 2)     Status: None   Collection Time    11/15/12  8:55 PM      Result Value Range Status   Specimen Description BLOOD RIGHT FOREARM   Final   Special Requests BOTTLES DRAWN AEROBIC AND ANAEROBIC 10CC   Final   Culture  Setup Time 11/16/2012 04:25   Final   Culture     Final   Value: STAPHYLOCOCCUS AUREUS     Note: RIFAMPIN AND GENTAMICIN SHOULD NOT BE USED AS SINGLE DRUGS FOR TREATMENT OF STAPH INFECTIONS.      Note: Gram Stain Report Called to,Read Back By and Verified With: PATTY CLARK 11/16/12 @ 7:16PM BY RUSCA.   Report Status 11/18/2012 FINAL   Final   Organism ID, Bacteria STAPHYLOCOCCUS AUREUS   Final  CULTURE, BLOOD (ROUTINE X 2)     Status: None   Collection Time    11/17/12 12:10 AM      Result Value Range Status   Specimen Description BLOOD PORTACATH PER MD   Final   Special Requests BOTTLES DRAWN AEROBIC AND ANAEROBIC [facility]   Final   Culture  Setup Time 11/17/2012 09:07  Final   Culture     Final   Value:        BLOOD CULTURE RECEIVED NO GROWTH TO DATE CULTURE WILL BE HELD FOR 5 DAYS BEFORE ISSUING A FINAL NEGATIVE REPORT   Report Status PENDING   Incomplete  CULTURE, BLOOD (ROUTINE X 2)     Status: None   Collection Time    11/17/12 12:10 AM      Result Value Range Status   Specimen Description BLOOD PORTACATH PER MD   Final   Special Requests BOTTLES DRAWN AEROBIC AND ANAEROBIC 9CC   Final   Culture  Setup Time 11/17/2012 09:07   Final   Culture     Final   Value:        BLOOD CULTURE RECEIVED NO GROWTH TO DATE CULTURE WILL BE HELD FOR 5 DAYS BEFORE ISSUING A FINAL NEGATIVE REPORT   Report Status PENDING   Incomplete  MRSA PCR SCREENING     Status: Abnormal   Collection Time    11/17/12  1:55 AM      Result Value Range Status   MRSA by PCR POSITIVE (*) NEGATIVE Final   Comment:            The GeneXpert MRSA Assay (FDA     approved for NASAL specimens     only), is one component of a     comprehensive MRSA colonization     surveillance program. It is not     intended to diagnose MRSA     infection nor to guide or     monitor treatment for     MRSA infections.     RESULT CALLED TO, READ BACK BY AND VERIFIED WITH:     C AYERS AT 0403 ON 05.05.2014 BY NBROOKS  URINE CULTURE     Status: None   Collection Time    11/17/12  1:56 AM      Result Value Range Status   Specimen Description URINE, CLEAN CATCH   Final   Special Requests NONE   Final   Culture  Setup Time  11/17/2012 09:33   Final   Colony Count NO GROWTH   Final   Culture NO GROWTH   Final   Report Status 11/18/2012 FINAL   Final  STOOL CULTURE     Status: None   Collection Time    11/17/12  9:51 AM      Result Value Range Status   Specimen Description STOOL   Final   Special Requests NONE   Final   Culture Culture reincubated for better growth   Final   Report Status PENDING   Incomplete     Studies: Ct Abdomen Pelvis Wo Contrast  11/17/2012  *RADIOLOGY REPORT*  Clinical Data: Abdominal pain.  Mild proliferative disorder. History uterine cancer and appendectomy.  Question colitis.  CT ABDOMEN AND PELVIS WITHOUT CONTRAST  Technique:  Multidetector CT imaging of the abdomen and pelvis was performed following the standard protocol without intravenous contrast.  Comparison: 10/06/2008  Findings: Focal airspace opacity in the lower lingula is incompletely visualized and may be related to atelectasis or infiltrate.  Liver measures 19.6 cm in cranial caudal length, enlarged.  No focal abnormalities seen in the liver or spleen on this study performed without intravenous contrast material.  The spleen is 17.2 cm in cranial caudal length, enlarged.  The stomach, duodenum, pancreas, and adrenal glands are unremarkable.  The gallbladder is distended but otherwise normal in appearance.  There is no  intra or extrahepatic biliary duct dilatation.  No evidence for stones in either kidney.  There is mild fullness of the right ureter, especially distally.  No ureteral or bladder stones.  No abdominal aortic aneurysm.  No free fluid or lymphadenopathy in the abdomen.  Imaging through the pelvis shows no free intraperitoneal fluid.  No pelvic sidewall lymphadenopathy.  Bladder is unremarkable.  Despite the reported history of uterine cancer, the uterus is visualized in situ and has normal uninfused CT imaging features.  No evidence for adnexal mass.  No substantial diverticular change in the colon.  No evidence for  colonic diverticulitis.  No colonic wall thickening to suggest colitis.  Terminal ileum is unremarkable.  Insert at the  Bone windows reveal no worrisome lytic or sclerotic osseous lesions.  L4 compression fracture noted and was also present on a lumbar spine MRI from [place] [facility] dated 03/21/2010.  IMPRESSION: Hepatosplenomegaly, new since 10/06/2008 in this patient with history of myeloproliferative disorder.  Otherwise no acute findings in the abdomen or pelvis. Specifically, no evidence for colitis.  Lingular atelectasis or infiltrate.  Two-view chest x-ray from 2 days ago showed no abnormality at this location suggesting that it may be secondary to interval development of atelectasis.  Follow-up two-view chest x-ray could be performed to further evaluate.   Original Report Authenticated By: Kennith Center, M.D.      Additional labs:   Scheduled Meds: . aspirin EC  81 mg Oral Daily  .  ceFAZolin (ANCEF) IV  2 g Intravenous Q8H  . Chlorhexidine Gluconate Cloth  6 each Topical Q0600  . fentaNYL      . FLUoxetine  40 mg Oral QAC breakfast  . midazolam      . mupirocin ointment  1 application Nasal BID  . sodium chloride  3 mL Intravenous Q12H  . [START ON 11/20/2012] Vitamin D (Ergocalciferol)  50,000 Units Oral Custom   Continuous Infusions:    Principal Problem:   Staphylococcus aureus bacteremia Active Problems:   Anemia   MDS/MPN (myelodysplastic/myeloproliferative neoplasms)   Hypokalemia   Anxiety and depression   Hepatosplenomegaly on CT abdomen 11/17/12    Time spent: 40 minutes.    [facility]  [facility] Pager [phone].   If 8PM-8AM, please contact night-coverage at [email or web address], password [facility] 11/18/2012, 3:12 PM  LOS: 2 days

## 2012-11-18 NOTE — Procedures (Signed)
Successful RT IJ PORT REMOVAL NO COMP STABLE NO SIGNS OF INFECTION

## 2012-11-18 NOTE — Progress Notes (Signed)
[facility]  Date of Admission:  11/16/2012  Antibiotics: Vanco/cefazolin day 2  Subjective: Currently getting port out, MSSA  Objective: Temp:  [98 F (36.7 C)-98.3 F (36.8 C)] 98.3 F (36.8 C) (05/06 0430) Pulse Rate:  [75-84] 80 (05/06 1432) Resp:  [14-23] 14 (05/06 1432) BP: (104-145)/(48-68) 133/61 mmHg (05/06 1432) SpO2:  [82 %-100 %] 99 % (05/06 1432)  Unable to examine, getting port out  Lab Results Lab Results  Component Value Date   WBC 7.5 11/18/2012   HGB 7.8* 11/18/2012   HCT 23.8* 11/18/2012   MCV 88.8 11/18/2012   PLT 247 11/18/2012    Lab Results  Component Value Date   CREATININE 0.73 11/18/2012   BUN 15 11/18/2012   NA 136 11/18/2012   K 4.0 11/18/2012   CL 106 11/18/2012   CO2 22 11/18/2012    Lab Results  Component Value Date   ALT 40* 11/18/2012   AST 44* 11/18/2012   ALKPHOS 100 11/18/2012   BILITOT 1.4* 11/18/2012      Microbiology: Recent Results (from the past 240 hour(s))  URINE CULTURE     Status: None   Collection Time    11/15/12  7:18 PM      Result Value Range Status   Specimen Description URINE, CLEAN CATCH   Final   Special Requests NONE   Final   Culture  Setup Time 11/16/2012 05:07   Final   Colony Count 8,000 COLONIES/ML   Final   Culture INSIGNIFICANT GROWTH   Final   Report Status 11/17/2012 FINAL   Final  CULTURE, BLOOD (ROUTINE X 2)     Status: None   Collection Time    11/15/12  8:41 PM      Result Value Range Status   Specimen Description BLOOD RIGHT ANTECUBITAL   Final   Special Requests BOTTLES DRAWN AEROBIC AND ANAEROBIC 4CC   Final   Culture  Setup Time 11/16/2012 04:25   Final   Culture     Final   Value: STAPHYLOCOCCUS AUREUS     Note: SUSCEPTIBILITIES PERFORMED ON PREVIOUS CULTURE WITHIN THE LAST 5 DAYS.     Note: Gram Stain Report Called to,Read Back By and Verified With: PATTY CLARK 11/16/12 @ 7:16PM BY RUSCA.   Report Status 11/18/2012 FINAL   Final  CULTURE, BLOOD (ROUTINE X 2)     Status: None     Collection Time    11/15/12  8:55 PM      Result Value Range Status   Specimen Description BLOOD RIGHT FOREARM   Final   Special Requests BOTTLES DRAWN AEROBIC AND ANAEROBIC 10CC   Final   Culture  Setup Time 11/16/2012 04:25   Final   Culture     Final   Value: STAPHYLOCOCCUS AUREUS     Note: RIFAMPIN AND GENTAMICIN SHOULD NOT BE USED AS SINGLE DRUGS FOR TREATMENT OF STAPH INFECTIONS.     Note: Gram Stain Report Called to,Read Back By and Verified With: PATTY CLARK 11/16/12 @ 7:16PM BY RUSCA.   Report Status 11/18/2012 FINAL   Final   Organism ID, Bacteria STAPHYLOCOCCUS AUREUS   Final  CULTURE, BLOOD (ROUTINE X 2)     Status: None   Collection Time    11/17/12 12:10 AM      Result Value Range Status   Specimen Description BLOOD PORTACATH PER MD   Final   Special Requests BOTTLES DRAWN AEROBIC AND ANAEROBIC 9CC   Final  Culture  Setup Time 11/17/2012 09:07   Final   Culture     Final   Value:        BLOOD CULTURE RECEIVED NO GROWTH TO DATE CULTURE WILL BE HELD FOR 5 DAYS BEFORE ISSUING A FINAL NEGATIVE REPORT   Report Status PENDING   Incomplete  CULTURE, BLOOD (ROUTINE X 2)     Status: None   Collection Time    11/17/12 12:10 AM      Result Value Range Status   Specimen Description BLOOD PORTACATH PER MD   Final   Special Requests BOTTLES DRAWN AEROBIC AND ANAEROBIC 9CC   Final   Culture  Setup Time 11/17/2012 09:07   Final   Culture     Final   Value:        BLOOD CULTURE RECEIVED NO GROWTH TO DATE CULTURE WILL BE HELD FOR 5 DAYS BEFORE ISSUING A FINAL NEGATIVE REPORT   Report Status PENDING   Incomplete  MRSA PCR SCREENING     Status: Abnormal   Collection Time    11/17/12  1:55 AM      Result Value Range Status   MRSA by PCR POSITIVE (*) NEGATIVE Final   Comment:            The GeneXpert MRSA Assay (FDA     approved for NASAL specimens     only), is one component of a     comprehensive MRSA colonization     surveillance program. It is not     intended to diagnose  MRSA     infection nor to guide or     monitor treatment for     MRSA infections.     RESULT CALLED TO, READ BACK BY AND VERIFIED WITH:     C AYERS AT 0403 ON 05.05.2014 BY NBROOKS  URINE CULTURE     Status: None   Collection Time    11/17/12  1:56 AM      Result Value Range Status   Specimen Description URINE, CLEAN CATCH   Final   Special Requests NONE   Final   Culture  Setup Time 11/17/2012 09:33   Final   Colony Count NO GROWTH   Final   Culture NO GROWTH   Final   Report Status 11/18/2012 FINAL   Final  STOOL CULTURE     Status: None   Collection Time    11/17/12  9:51 AM      Result Value Range Status   Specimen Description STOOL   Final   Special Requests NONE   Final   Culture Culture reincubated for better growth   Final   Report Status PENDING   Incomplete    Studies/Results: Ct Abdomen Pelvis Wo Contrast  11/17/2012  *RADIOLOGY REPORT*  Clinical Data: Abdominal pain.  Mild proliferative disorder. History uterine cancer and appendectomy.  Question colitis.  CT ABDOMEN AND PELVIS WITHOUT CONTRAST  Technique:  Multidetector CT imaging of the abdomen and pelvis was performed following the standard protocol without intravenous contrast.  Comparison: 10/06/2008  Findings: Focal airspace opacity in the lower lingula is incompletely visualized and may be related to atelectasis or infiltrate.  Liver measures 19.6 cm in cranial caudal length, enlarged.  No focal abnormalities seen in the liver or spleen on this study performed without intravenous contrast material.  The spleen is 17.2 cm in cranial caudal length, enlarged.  The stomach, duodenum, pancreas, and adrenal glands are unremarkable.  The gallbladder is distended but otherwise  normal in appearance.  There is no intra or extrahepatic biliary duct dilatation.  No evidence for stones in either kidney.  There is mild fullness of the right ureter, especially distally.  No ureteral or bladder stones.  No abdominal aortic aneurysm.  No  free fluid or lymphadenopathy in the abdomen.  Imaging through the pelvis shows no free intraperitoneal fluid.  No pelvic sidewall lymphadenopathy.  Bladder is unremarkable.  Despite the reported history of uterine cancer, the uterus is visualized in situ and has normal uninfused CT imaging features.  No evidence for adnexal mass.  No substantial diverticular change in the colon.  No evidence for colonic diverticulitis.  No colonic wall thickening to suggest colitis.  Terminal ileum is unremarkable.  Insert at the  Bone windows reveal no worrisome lytic or sclerotic osseous lesions.  L4 compression fracture noted and was also present on a lumbar spine MRI from [place] [facility] dated 03/21/2010.  IMPRESSION: Hepatosplenomegaly, new since 10/06/2008 in this patient with history of myeloproliferative disorder.  Otherwise no acute findings in the abdomen or pelvis. Specifically, no evidence for colitis.  Lingular atelectasis or infiltrate.  Two-view chest x-ray from 2 days ago showed no abnormality at this location suggesting that it may be secondary to interval development of atelectasis.  Follow-up two-view chest x-ray could be performed to further evaluate.   Original Report Authenticated By: Kennith Center, M.D.     Assessment/Plan: 1)  MSSA bacteremia - port infection vs other.  No other signs or symptoms (back pain, abscesses).  Stopped vancomycin with MSSA.  TTE ok.  Duration of cefazolin dependent on TEE.  Patient with no fever and WBC ok.   -picc ok once blood cultures negative at 72 hours (repeat done yesterday)  Staci Righter, MD [facility] Infectious [facility] [phone] pager   11/18/2012, 2:45 PM

## 2012-11-18 NOTE — Progress Notes (Signed)
TEE scheduled for 5/7 in [facility] endoscopy with Dr. Eldridge Dace.

## 2012-11-19 ENCOUNTER — Encounter (HOSPITAL_COMMUNITY)
Admission: EM | Disposition: A | Discharge status: Home Health | Payer: Self-pay | Source: Home / Self Care | Attending: Internal Medicine

## 2012-11-19 ENCOUNTER — Encounter (HOSPITAL_COMMUNITY): Payer: Self-pay

## 2012-11-19 HISTORY — PX: TEE WITHOUT CARDIOVERSION: SHX5443

## 2012-11-19 LAB — CBC
MCH: 29.2 pg (ref 26.0–34.0)
MCV: 90.1 fL (ref 78.0–100.0)
Platelets: 301 10*3/uL (ref 150–400)
RBC: 2.74 MIL/uL — ABNORMAL LOW (ref 3.87–5.11)
RDW: 22.9 % — ABNORMAL HIGH (ref 11.5–15.5)

## 2012-11-19 SURGERY — ECHOCARDIOGRAM, TRANSESOPHAGEAL
Anesthesia: Moderate Sedation

## 2012-11-19 MED ORDER — SODIUM CHLORIDE 0.9 % IV SOLN: INTRAVENOUS | Status: DC

## 2012-11-19 MED ORDER — LIDOCAINE VISCOUS 2 % MT SOLN
OROMUCOSAL | Status: DC | PRN
  Administered 2012-11-19: 10 mL via OROMUCOSAL

## 2012-11-19 MED ORDER — SODIUM CHLORIDE 0.9 % IV SOLN
INTRAVENOUS | Status: DC
  Administered 2012-11-19: 01:00:00 via INTRAVENOUS

## 2012-11-19 MED ORDER — MIDAZOLAM HCL 5 MG/ML IJ SOLN
INTRAMUSCULAR | Status: AC
  Filled 2012-11-19: qty 3

## 2012-11-19 MED ORDER — LIDOCAINE VISCOUS 2 % MT SOLN
OROMUCOSAL | Status: AC
  Filled 2012-11-19: qty 15

## 2012-11-19 MED ORDER — FENTANYL CITRATE 0.05 MG/ML IJ SOLN
INTRAMUSCULAR | Status: AC
  Filled 2012-11-19: qty 2

## 2012-11-19 MED ORDER — MIDAZOLAM HCL 10 MG/2ML IJ SOLN
INTRAMUSCULAR | Status: DC | PRN
  Administered 2012-11-19 (×2): 1 mg via INTRAVENOUS
  Administered 2012-11-19 (×2): 2 mg via INTRAVENOUS

## 2012-11-19 MED ORDER — FENTANYL CITRATE 0.05 MG/ML IJ SOLN
INTRAMUSCULAR | Status: DC | PRN
  Administered 2012-11-19: 12.5 ug via INTRAVENOUS
  Administered 2012-11-19 (×2): 25 ug via INTRAVENOUS

## 2012-11-19 NOTE — OR Nursing (Signed)
[facility] called for transport back to [facility],

## 2012-11-19 NOTE — Progress Notes (Signed)
  Echocardiogram Echocardiogram Transesophageal has been performed.  Carolyn Brennan 11/19/2012, 2:34 PM

## 2012-11-19 NOTE — H&P (Signed)
Date of Initial H&P: 11/17/12  History reviewed, patient examined, no change in status, stable for surgery.  Procedure explained.  Questions answered.

## 2012-11-19 NOTE — Progress Notes (Signed)
Afebrile day #3 antibiotics. Blood cultures grew methicillin sensitive Staphylococcus aureus. Antibiotic regimen modified currently on single agent cefazolin Discussed with infectious disease expert. This infection necessitates removal of the Port-A-Cath infusion device which was done yesterday. Transthoracic echocardiogram was normal no gross vegetations on the valves. She is scheduled for a transesophageal echocardiogram today. Hemoglobin at her baseline of 8 g. White count down from 24,000 to  9000 Clinically stable. No audible murmur. Impression: #1. Staph aureus bacteremia Source unclear. Possibly related to recent placement of Port-A-Cath. She also reminded me she had Mohs surgery so that might have been some break in the integrity of the skin over the bridge of her nose. Infectious expert to recommend duration of antibiotics when results of TEE available. If cultures negative at 72 hours, a PICC catheter will be placed and arrangements made to continue antibiotics as an outpatient. I discussed all of the above with the patient in detail. #2. 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

## 2012-11-19 NOTE — Progress Notes (Signed)
[facility] PROGRESS NOTE  [name]:[id number] DOB: [birth year] DOA: 11/16/2012 PCP: Hollice Espy, MD  Brief narrative: Carolyn Brennan is an 72 y.o. female with history of MDS, transfusion dependent for anemia, anxiety and depression, port a cath placed on 10/14/12, was first seen at [facility] ED on 11/15/12 for low-grade fevers, chills, generalized weakness and episodes of vomiting and diarrhea, which were attributed to viral syndrome. After discussing with a hematologist on call, patient was discharged from ED on Levaquin. ED called her back on 11/16/12 due to preliminary blood cultures positive for gram-positive cocci in clusters. In ED, hemoglobin 5.9, white blood cell 23.7. She denies any bleeding or black stools.  Port a cath removed 11/18/12, and TTE and TEE both negative for vegetations.    Assessment/Plan: Principal Problem: Staphylococcus aureus bacteremia -Possibly secondary to line (port a cath) sepsis.  -Blood cultures x2 from 5/3+.  -Dr. Luciana Axe, ID consultation appreciated. Vancomycin discontinued.  -Continue cefazolin-duration dependent on TEE (TTE negative for vegetations), await Dr. Ephriam Knuckles final recommendations.   Deboraha Sprang cardiology consulted for TEE, done on 11/19/12, negative for vegetations. -Porta cath removed by IR on 5/6. PICC okay once blood cultures negative at 72 hours-drawn on 5/5. 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  -No signs and symptoms of bleeding.  -Dr. Cyndie Chime input appreciated. Anxiety and depression  -Continue home medications. Stable.  Hypokalemia -Repleted.  Diarrhea -Improved. C. difficile PCR negative.  -CT abdomen negative for acute findings. ? Acute viral GE. Hepatosplenomegaly on CT -Possibly from MDS.  -Ferritin: 4945.  -Mild transaminitis-unchanged. Management per hematology.  Code Status: Full  Family Communication: Discussed with patient   Disposition Plan: Remains inpatient. Home when medically stable.  Medical Consultants:  Dr. Cephas Darby, Oncology  Dr. Staci Righter, ID  Dr. Berdine Dance, IR  Dr. Lance Muss, Cardiology  Other Consultants:  None.  Anti-infectives:  Vancomycin 11/16/12--->11/18/12  Cefazolin 11/17/12--->  HPI/Subjective: Carolyn Brennan is feeling much better though not yet back to baseline.  Still having diarrhea.  No nausea or vomiting.  Objective: Filed Vitals:   11/19/12 1340 11/19/12 1359 11/19/12 1400 11/19/12 1410  BP: 122/53 122/48 122/48 122/38  Pulse:      Temp:      TempSrc:      Resp: 29 16 15 16   Height:      Weight:      SpO2: 97% 96% 94% 93%    Intake/Output Summary (Last 24 hours) at 11/19/12 1501 Last data filed at 11/19/12 0643  Gross per 24 hour  Intake  420.5 ml  Output    875 ml  Net -454.5 ml    Exam: Gen:  NAD Cardiovascular:  RRR, No M/R/G Respiratory:  Lungs CTAB Gastrointestinal:  Abdomen soft, NT/ND, + BS Extremities:  No C/E/C  Data Reviewed: Basic Metabolic Panel:  Recent Labs Lab 11/15/12 2000 11/16/12 2215 11/17/12 0540 11/18/12 0356  NA 136 133* 134* 136  K 4.6 3.6 3.4* 4.0  CL 100 100 102 106  CO2 25 23 24 22   GLUCOSE 119* 128* 126* 98  BUN 13 18 16 15   CREATININE 0.73 0.80 0.80 0.73  CALCIUM 9.6 8.5 7.8* 8.1*   GFR Estimated Creatinine Clearance: 61.2 ml/min (by C-G formula based on Cr of 0.73). Liver Function Tests:  Recent Labs Lab 11/15/12 2000 11/16/12 2215 11/18/12 0356  AST 21 61* 44*  ALT 20 44* 40*  ALKPHOS 106 78 100  BILITOT 2.5*  3.1* 1.4*  PROT 6.6 6.0 5.6*  ALBUMIN 4.1 3.5 3.1*   CBC:  Recent Labs Lab 11/15/12 2000 11/16/12 2215 11/17/12 0540 11/17/12 1115 11/18/12 0356 11/19/12 0345  WBC 23.7* 8.1 7.3 6.8 7.5 9.1  NEUTROABS 20.9* 7.2  --   --   --   --   HGB 7.7* 5.9* 6.7* 7.8* 7.8* 8.0*  HCT 24.0* 18.2* 20.1* 22.3* 23.8* 24.7*  MCV 92.3 91.5 90.1 89.9 88.8 90.1  PLT 518* 307  249 240 247 301   BNP (last 3 results)  Recent Labs  02/21/12 1608  PROBNP 277.6*   Anemia work up  Recent Labs  11/17/12 1946  FERRITIN 4945*   Microbiology Recent Results (from the past 240 hour(s))  URINE CULTURE     Status: None   Collection Time    11/15/12  7:18 PM      Result Value Range Status   Specimen Description URINE, CLEAN CATCH   Final   Special Requests NONE   Final   Culture  Setup Time 11/16/2012 05:07   Final   Colony Count 8,000 COLONIES/ML   Final   Culture INSIGNIFICANT GROWTH   Final   Report Status 11/17/2012 FINAL   Final  CULTURE, BLOOD (ROUTINE X 2)     Status: None   Collection Time    11/15/12  8:41 PM      Result Value Range Status   Specimen Description BLOOD RIGHT ANTECUBITAL   Final   Special Requests BOTTLES DRAWN AEROBIC AND ANAEROBIC 4CC   Final   Culture  Setup Time 11/16/2012 04:25   Final   Culture     Final   Value: STAPHYLOCOCCUS AUREUS     Note: SUSCEPTIBILITIES PERFORMED ON PREVIOUS CULTURE WITHIN THE LAST 5 DAYS.     Note: Gram Stain Report Called to,Read Back By and Verified With: PATTY CLARK 11/16/12 @ 7:16PM BY RUSCA.   Report Status 11/18/2012 FINAL   Final  CULTURE, BLOOD (ROUTINE X 2)     Status: None   Collection Time    11/15/12  8:55 PM      Result Value Range Status   Specimen Description BLOOD RIGHT FOREARM   Final   Special Requests BOTTLES DRAWN AEROBIC AND ANAEROBIC 10CC   Final   Culture  Setup Time 11/16/2012 04:25   Final   Culture     Final   Value: STAPHYLOCOCCUS AUREUS     Note: RIFAMPIN AND GENTAMICIN SHOULD NOT BE USED AS SINGLE DRUGS FOR TREATMENT OF STAPH INFECTIONS.     Note: Gram Stain Report Called to,Read Back By and Verified With: PATTY CLARK 11/16/12 @ 7:16PM BY RUSCA.   Report Status 11/18/2012 FINAL   Final   Organism ID, Bacteria STAPHYLOCOCCUS AUREUS   Final  CULTURE, BLOOD (ROUTINE X 2)     Status: None   Collection Time    11/17/12 12:10 AM      Result Value Range Status   Specimen  Description BLOOD PORTACATH PER MD   Final   Special Requests BOTTLES DRAWN AEROBIC AND ANAEROBIC 9CC   Final   Culture  Setup Time 11/17/2012 09:07   Final   Culture     Final   Value:        BLOOD CULTURE RECEIVED NO GROWTH TO DATE CULTURE WILL BE HELD FOR 5 DAYS BEFORE ISSUING A FINAL NEGATIVE REPORT   Report Status PENDING   Incomplete  CULTURE, BLOOD (ROUTINE X 2)     Status:  None   Collection Time    11/17/12 12:10 AM      Result Value Range Status   Specimen Description BLOOD PORTACATH PER MD   Final   Special Requests BOTTLES DRAWN AEROBIC AND ANAEROBIC 9CC   Final   Culture  Setup Time 11/17/2012 09:07   Final   Culture     Final   Value:        BLOOD CULTURE RECEIVED NO GROWTH TO DATE CULTURE WILL BE HELD FOR 5 DAYS BEFORE ISSUING A FINAL NEGATIVE REPORT   Report Status PENDING   Incomplete  MRSA PCR SCREENING     Status: Abnormal   Collection Time    11/17/12  1:55 AM      Result Value Range Status   MRSA by PCR POSITIVE (*) NEGATIVE Final   Comment:            The GeneXpert MRSA Assay (FDA     approved for NASAL specimens     only), is one component of a     comprehensive MRSA colonization     surveillance program. It is not     intended to diagnose MRSA     infection nor to guide or     monitor treatment for     MRSA infections.     RESULT CALLED TO, READ BACK BY AND VERIFIED WITH:     C AYERS AT 0403 ON 05.05.2014 BY NBROOKS  URINE CULTURE     Status: None   Collection Time    11/17/12  1:56 AM      Result Value Range Status   Specimen Description URINE, CLEAN CATCH   Final   Special Requests NONE   Final   Culture  Setup Time 11/17/2012 09:33   Final   Colony Count NO GROWTH   Final   Culture NO GROWTH   Final   Report Status 11/18/2012 FINAL   Final  STOOL CULTURE     Status: None   Collection Time    11/17/12  9:51 AM      Result Value Range Status   Specimen Description STOOL   Final   Special Requests NONE   Final   Culture NO SUSPICIOUS COLONIES,  CONTINUING TO HOLD   Final   Report Status PENDING   Incomplete  CLOSTRIDIUM DIFFICILE BY PCR     Status: None   Collection Time    11/19/12 12:33 AM      Result Value Range Status   C difficile by pcr NEGATIVE  NEGATIVE Final     Procedures and Diagnostic Studies:  Ct Abdomen Pelvis Wo Contrast 11/17/2012 IMPRESSION: Hepatosplenomegaly, new since 10/06/2008 in this patient with history of myeloproliferative disorder.  Otherwise no acute findings in the abdomen or pelvis. Specifically, no evidence for colitis.  Lingular atelectasis or infiltrate.  Two-view chest x-ray from 2 days ago showed no abnormality at this location suggesting that it may be secondary to interval development of atelectasis.  Follow-up two-view chest x-ray could be performed to further evaluate.   Original Report Authenticated By: Kennith Center, M.D.     Dg Chest 2 View 11/15/2012 IMPRESSION: No acute abnormality.   Original Report Authenticated By: Lacy Duverney, M.D.     Ir Removal [employer] W/o Fl Mod Sed 11/18/2012  IMPRESSION: Successful right IJ power port removal.   Original Report Authenticated By: Judie Petit. Shick, M.D.     2 D Echo 11/17/12: Impressions: Vegetation is not evident. Consider TEE  if clinically warranted.   TEE 11/19/12: Impressions: Normal LV function. Thickened mitral valve with mild to moderate posteriorly directed MR. No LA/LAA appendage. Normal tricuspid valve with trivial TR. Tricuspid aortic valve. Aortic sclerosis. Normal Pulmonic valve. Tr PI. Moderate scattered atherosclerosis throughout the abdominal aorta.    Scheduled Meds: . aspirin EC  81 mg Oral Daily  .  ceFAZolin (ANCEF) IV  2 g Intravenous Q8H  . Chlorhexidine Gluconate Cloth  6 each Topical Q0600  . FLUoxetine  40 mg Oral QAC breakfast  . mupirocin ointment  1 application Nasal BID  . sodium chloride  3 mL Intravenous Q12H  . [START ON 11/20/2012] Vitamin D (Ergocalciferol)  50,000 Units Oral Custom   Continuous Infusions: .  sodium chloride    . sodium chloride 75 mL/hr at 11/19/12 0109    Time spent: 35 minutes.   LOS: 3 days   Jacqualine Weichel  [facility] Pager [phone].  If 8PM-8AM, please contact night-coverage at [email or web address], password [facility] 11/19/2012, 3:01 PM

## 2012-11-19 NOTE — CV Procedure (Signed)
TEE performed without apparent complication.  Normal LV function.  Thickened mitral valve with mild to moderate posteriorly directed MR.  No LA/LAA appendage.  Normal tricuspid valve with trivial TR.  Tricuspid aortic valve. Aortic sclerosis.  Normal Pulmonic valve. Tr PI.  Moderate scattered atherosclerosis throughout the abdominal aorta.

## 2012-11-20 ENCOUNTER — Other Ambulatory Visit: Payer: Self-pay | Admitting: Oncology

## 2012-11-20 ENCOUNTER — Encounter (HOSPITAL_COMMUNITY): Payer: Self-pay | Admitting: Interventional Cardiology

## 2012-11-20 MED ORDER — MUPIROCIN 2 % EX OINT: 1.0000 "application " | TOPICAL_OINTMENT | Freq: Two times a day (BID) | CUTANEOUS | Status: DC

## 2012-11-20 MED ORDER — CEFAZOLIN SODIUM-DEXTROSE 2-3 GM-% IV SOLR: 2.0000 g | Freq: Three times a day (TID) | INTRAVENOUS | Status: DC

## 2012-11-20 MED ORDER — HYDROCODONE-ACETAMINOPHEN 5-325 MG PO TABS: 1.0000 | ORAL_TABLET | ORAL | Status: DC | PRN

## 2012-11-20 MED ORDER — LORAZEPAM 1 MG PO TABS
1.0000 mg | ORAL_TABLET | Freq: Once | ORAL | Status: AC
  Administered 2012-11-20: 1 mg via ORAL
  Filled 2012-11-20: qty 1

## 2012-11-20 NOTE — Progress Notes (Signed)
Peripherally Inserted Central Catheter/Midline Placement  The IV Nurse has discussed with the patient and/or persons authorized to consent for the patient, the purpose of this procedure and the potential benefits and risks involved with this procedure.  The benefits include less needle sticks, lab draws from the catheter and patient may be discharged home with the catheter.  Risks include, but not limited to, infection, bleeding, blood clot (thrombus formation), and puncture of an artery; nerve damage.  Alternatives to this procedure were also discussed.  PICC/Midline Placement Documentation        Mellissa Kohut 11/20/2012, 5:50 PM

## 2012-11-20 NOTE — Progress Notes (Signed)
[facility]  Date of Admission:  11/16/2012  Antibiotics: Vanco/cefazolin day 3  Subjective: TEE negative for vegetation  Objective: Temp:  [98 F (36.7 C)-98.5 F (36.9 C)] 98.5 F (36.9 C) (05/08 0550) Pulse Rate:  [77-83] 77 (05/08 0550) Resp:  [15-37] 18 (05/08 0550) BP: (104-149)/(38-82) 149/54 mmHg (05/08 0550) SpO2:  [93 %-99 %] 98 % (05/08 0550)  Gen: AAO xx 3, nad CV: RRR without m/r/g Lungs: CTA B Abd: soft, nt, nd, +bs  Lab Results Lab Results  Component Value Date   WBC 9.1 11/19/2012   HGB 8.0* 11/19/2012   HCT 24.7* 11/19/2012   MCV 90.1 11/19/2012   PLT 301 11/19/2012    Lab Results  Component Value Date   CREATININE 0.73 11/18/2012   BUN 15 11/18/2012   NA 136 11/18/2012   K 4.0 11/18/2012   CL 106 11/18/2012   CO2 22 11/18/2012    Lab Results  Component Value Date   ALT 40* 11/18/2012   AST 44* 11/18/2012   ALKPHOS 100 11/18/2012   BILITOT 1.4* 11/18/2012      Microbiology: Recent Results (from the past 240 hour(s))  URINE CULTURE     Status: None   Collection Time    11/15/12  7:18 PM      Result Value Range Status   Specimen Description URINE, CLEAN CATCH   Final   Special Requests NONE   Final   Culture  Setup Time 11/16/2012 05:07   Final   Colony Count 8,000 COLONIES/ML   Final   Culture INSIGNIFICANT GROWTH   Final   Report Status 11/17/2012 FINAL   Final  CULTURE, BLOOD (ROUTINE X 2)     Status: None   Collection Time    11/15/12  8:41 PM      Result Value Range Status   Specimen Description BLOOD RIGHT ANTECUBITAL   Final   Special Requests BOTTLES DRAWN AEROBIC AND ANAEROBIC 4CC   Final   Culture  Setup Time 11/16/2012 04:25   Final   Culture     Final   Value: STAPHYLOCOCCUS AUREUS     Note: SUSCEPTIBILITIES PERFORMED ON PREVIOUS CULTURE WITHIN THE LAST 5 DAYS.     Note: Gram Stain Report Called to,Read Back By and Verified With: PATTY CLARK 11/16/12 @ 7:16PM BY RUSCA.   Report Status 11/18/2012 FINAL   Final  CULTURE,  BLOOD (ROUTINE X 2)     Status: None   Collection Time    11/15/12  8:55 PM      Result Value Range Status   Specimen Description BLOOD RIGHT FOREARM   Final   Special Requests BOTTLES DRAWN AEROBIC AND ANAEROBIC 10CC   Final   Culture  Setup Time 11/16/2012 04:25   Final   Culture     Final   Value: STAPHYLOCOCCUS AUREUS     Note: RIFAMPIN AND GENTAMICIN SHOULD NOT BE USED AS SINGLE DRUGS FOR TREATMENT OF STAPH INFECTIONS.     Note: Gram Stain Report Called to,Read Back By and Verified With: PATTY CLARK 11/16/12 @ 7:16PM BY RUSCA.   Report Status 11/18/2012 FINAL   Final   Organism ID, Bacteria STAPHYLOCOCCUS AUREUS   Final  CULTURE, BLOOD (ROUTINE X 2)     Status: None   Collection Time    11/17/12 12:10 AM      Result Value Range Status   Specimen Description BLOOD PORTACATH PER MD   Final   Special Requests BOTTLES DRAWN  AEROBIC AND ANAEROBIC 9CC   Final   Culture  Setup Time 11/17/2012 09:07   Final   Culture     Final   Value:        BLOOD CULTURE RECEIVED NO GROWTH TO DATE CULTURE WILL BE HELD FOR 5 DAYS BEFORE ISSUING A FINAL NEGATIVE REPORT   Report Status PENDING   Incomplete  CULTURE, BLOOD (ROUTINE X 2)     Status: None   Collection Time    11/17/12 12:10 AM      Result Value Range Status   Specimen Description BLOOD PORTACATH PER MD   Final   Special Requests BOTTLES DRAWN AEROBIC AND ANAEROBIC 9CC   Final   Culture  Setup Time 11/17/2012 09:07   Final   Culture     Final   Value:        BLOOD CULTURE RECEIVED NO GROWTH TO DATE CULTURE WILL BE HELD FOR 5 DAYS BEFORE ISSUING A FINAL NEGATIVE REPORT   Report Status PENDING   Incomplete  MRSA PCR SCREENING     Status: Abnormal   Collection Time    11/17/12  1:55 AM      Result Value Range Status   MRSA by PCR POSITIVE (*) NEGATIVE Final   Comment:            The GeneXpert MRSA Assay (FDA     approved for NASAL specimens     only), is one component of a     comprehensive MRSA colonization     surveillance program. It  is not     intended to diagnose MRSA     infection nor to guide or     monitor treatment for     MRSA infections.     RESULT CALLED TO, READ BACK BY AND VERIFIED WITH:     C AYERS AT 0403 ON 05.05.2014 BY NBROOKS  URINE CULTURE     Status: None   Collection Time    11/17/12  1:56 AM      Result Value Range Status   Specimen Description URINE, CLEAN CATCH   Final   Special Requests NONE   Final   Culture  Setup Time 11/17/2012 09:33   Final   Colony Count NO GROWTH   Final   Culture NO GROWTH   Final   Report Status 11/18/2012 FINAL   Final  STOOL CULTURE     Status: None   Collection Time    11/17/12  9:51 AM      Result Value Range Status   Specimen Description STOOL   Final   Special Requests NONE   Final   Culture NO SUSPICIOUS COLONIES, CONTINUING TO HOLD   Final   Report Status PENDING   Incomplete  CLOSTRIDIUM DIFFICILE BY PCR     Status: None   Collection Time    11/19/12 12:33 AM      Result Value Range Status   C difficile by pcr NEGATIVE  NEGATIVE Final    Studies/Results: Ir Removal [employer] Access W/ Port W/o Fl Mod Sed  11/18/2012  *RADIOLOGY REPORT*  Clinical Data: Myelodysplastic syndrome, bacteremia, possible line sepsis  RIGHT IJ PORT CATHETER REMOVAL  Date:  11/18/2012 13:45:00  Radiologist:  Judie Petit. Ruel Favors, M.D.  Medications:  The patient is already receiving IV antibiotics.  75 mcg Fentanyl, 1.5 mg Versed  Complications:  No immediate  PROCEDURE/FINDINGS:  Informed consent was obtained from the patient following explanation of the procedure, risks, benefits and alternatives.  The patient understands, agrees and consents for the procedure. All questions were addressed.  A time out was performed.  Maximal barrier sterile technique utilized including caps, mask, sterile gowns, sterile gloves, large sterile drape, hand hygiene, and Chloroprep.  Under sterile conditions and local anesthesia, an incision was made along the existing scar.  Blunt and sharp dissection utilized to  remove the port catheter from the subcutaneous tissues.  The port catheter and tubing were removed entirely.  Hemostasis obtained. Pocket flushed with saline.  Incision was closed with a running 4-0 Vicryl suture.  Dermabond applied externally.  No immediate complication.  Sterile dressing applied.  The patient tolerated the procedure well.  IMPRESSION: Successful right IJ power port removal.   Original Report Authenticated By: Judie Petit. Miles Costain, M.D.     Assessment/Plan: 1)  MSSA bacteremia - port infection vs other.  No other signs or symptoms (back pain, abscesses).  -TEE negative, repeat blood cultures negative -2 weeks of IV cefazolin per home health protocol through 5/20.  -CBC, CMP after one week -I will order picc line for today  I will sign off, please call with questions  Staci Righter, MD [facility] [phone] pager   11/20/2012, 8:21 AM

## 2012-11-20 NOTE — Discharge Summary (Signed)
Physician Discharge Summary  Carolyn Brennan NGE:[id number] DOB: 05/22/1941 DOA: 11/16/2012  PCP: Hollice Espy, MD  Admit date: 11/16/2012 Discharge date: 11/20/2012  Recommendations for Outpatient Follow-up:  1. Patient is being sent home with home health services for administration of IV antibiotics through 12/02/2012.  Discharge Diagnoses:  Principal Problem:    Staphylococcus aureus bacteremia Active Problems:    Anemia    MDS/MPN (myelodysplastic/myeloproliferative neoplasms)    Hypokalemia    Anxiety and depression    Hepatosplenomegaly on CT abdomen 11/17/12   Discharge Condition: Improved.  Diet recommendation: Regular.  History of present illness:  Carolyn Brennan is an 72 y.o. female with history of MDS, transfusion dependent for anemia, anxiety and depression, port a cath placed on 10/14/12, was first seen at [facility] ED on 11/15/12 for low-grade fevers, chills, generalized weakness and episodes of vomiting and diarrhea, which were attributed to viral syndrome. After discussing with a hematologist on call, patient was discharged from ED on Levaquin. ED called her back on 11/16/12 due to preliminary blood cultures positive for gram-positive cocci in clusters. In ED, hemoglobin 5.9, white blood cell 23.7. She denies any bleeding or black stools. Port a cath removed 11/18/12, and TTE and TEE both negative for vegetations.   Hospital Course by problem:  Principal Problem:  Staphylococcus aureus bacteremia  -Possibly secondary to line infection.  -Blood cultures x2 from 5/3+.  -Patient was seen and evaluated by Dr. Luciana Axe, of infectious diseases who recommended a course of cefazolin through 12/02/2012.  -Status post TTE and TEE, both negative for vegetations. -Porta cath removed by IR on 5/6. PICC to be placed prior to discharge today for home infusion therapy.  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  -No signs  and symptoms of bleeding.  -Dr. Cyndie Chime evaluated the patient while in the hospital.  Anxiety and depression  -Continue home medications. Stable.  Hypokalemia  -Repleted.  Diarrhea  -Improved. C. difficile PCR negative.  -CT abdomen negative for acute findings.   Hepatosplenomegaly on CT  -Possibly from MDS.  -Ferritin: 4945.  -Mild transaminitis-unchanged. Management per hematology.  Medical Consultants:  Dr. Cephas Darby, Oncology  Dr. Staci Righter, ID  Dr. Berdine Dance, IR  Dr. Lance Muss, Cardiology   Discharge Exam: Filed Vitals:   11/20/12 1500  BP: 144/54  Pulse: 79  Temp: 97.3 F (36.3 C)  Resp: 20   Filed Vitals:   11/19/12 1811 11/19/12 2135 11/20/12 0550 11/20/12 1500  BP: 104/72 137/66 149/54 144/54  Pulse: 78 83 77 79  Temp: 98.1 F (36.7 C) 98.1 F (36.7 C) 98.5 F (36.9 C) 97.3 F (36.3 C)  TempSrc: Oral Oral Oral Oral  Resp: 18 18 18 20   Height:      Weight:      SpO2: 98% 98% 98% 97%    Gen:  NAD Cardiovascular:  RRR, No M/R/G Respiratory: Lungs CTAB Gastrointestinal: Abdomen soft, NT/ND with normal active bowel sounds. Extremities: No C/E/C   Discharge Instructions  Discharge Orders   Future Appointments Provider Department Dept Phone   11/21/2012 4:00 PM [facility] Only [facility] MED[facility] ON[facility] [phone]   11/21/2012 4:30 PM Levert Feinstein, MD [facility] MEDICAL ONCOLOGY [phone]   12/16/2012 1:00 PM Annamaria Boots, MD [facility] HE[facility] [phone]   Future Orders Complete By Expires     Call MD for:  extreme fatigue  As directed  Call MD for:  persistant dizziness or light-headedness  As directed     Call MD for:  persistant nausea and vomiting  As directed     Call MD for:  redness, tenderness, or signs of infection (pain, swelling, redness, odor or green/yellow discharge around incision site)  As directed     Call MD for:  severe uncontrolled pain  As  directed     Call MD for:  temperature >100.4  As directed     Diet general  As directed     Increase activity slowly  As directed         Medication List    STOP taking these medications       levofloxacin 750 MG tablet  Commonly known as:  LEVAQUIN      TAKE these medications       aspirin EC 81 MG tablet  Take 81 mg by mouth daily.     BENADRYL 25 MG tablet  Generic drug:  diphenhydrAMINE  Take 12.5 mg by mouth at bedtime as needed for sleep.     ceFAZolin 2-3 GM-% Solr  Commonly known as:  ANCEF  Inject 50 mLs (2 g total) into the vein every 8 (eight) hours.     ergocalciferol 50000 UNITS capsule  Commonly known as:  VITAMIN D2  Take 50,000 Units by mouth 2 (two) times a week. On mondays and fridays     FLUoxetine 40 MG capsule  Commonly known as:  PROZAC  Take 40 mg by mouth daily before breakfast.     HYDROcodone-acetaminophen 5-325 MG per tablet  Commonly known as:  NORCO/VICODIN  Take 1-2 tablets by mouth every 4 (four) hours as needed.     lidocaine-prilocaine cream  Commonly known as:  EMLA  Apply topically as needed. Apply as directed 1 hour prior to portacath access     mupirocin ointment 2 %  Commonly known as:  BACTROBAN  Apply 1 application topically 2 (two) times daily. Apply inside nostrils twice a day for 1 week.           Follow-up Information   Follow up with Hollice Espy, MD. Schedule an appointment as soon as possible for a visit in 1 week. [facility] follow up)    Contact information:   [address] [place] [address] [phone]       Follow up with Levert Feinstein, MD. (As scheduled.)    Contact information:   [address] [place] [address] [phone]        The results of significant diagnostics from this hospitalization (including imaging, microbiology, ancillary and laboratory) are listed below for reference.    Significant Diagnostic Studies: Ct Abdomen Pelvis Wo Contrast 11/17/2012  IMPRESSION: Hepatosplenomegaly, new since 10/06/2008 in this patient with history of myeloproliferative disorder. Otherwise no acute findings in the abdomen or pelvis. Specifically, no evidence for colitis. Lingular atelectasis or infiltrate. Two-view chest x-ray from 2 days ago showed no abnormality at this location suggesting that it may be secondary to interval development of atelectasis. Follow-up two-view chest x-ray could be performed to further evaluate. Original Report Authenticated By: Kennith Center, M.D.  Dg Chest 2 View 11/15/2012 IMPRESSION: No acute abnormality. Original Report Authenticated By: Lacy Duverney, M.D.  Ir Removal Gap Inc W/o Fl Mod Sed 11/18/2012 IMPRESSION: Successful right IJ power port removal. Original Report Authenticated By: Judie Petit. Shick, M.D.  2 D Echo 11/17/12: Impressions: Vegetation is not evident. Consider TEE if clinically warranted. TEE  11/19/12: Impressions: Normal LV function. Thickened mitral valve with mild to moderate posteriorly directed MR. No LA/LAA appendage. Normal tricuspid valve with trivial TR. Tricuspid aortic valve. Aortic sclerosis. Normal Pulmonic valve. Tr PI. Moderate scattered atherosclerosis throughout the abdominal aorta.   Labs:  Basic Metabolic Panel:  Recent Labs Lab 11/15/12 2000 11/16/12 2215 11/17/12 0540 11/18/12 0356  NA 136 133* 134* 136  K 4.6 3.6 3.4* 4.0  CL 100 100 102 106  CO2 25 23 24 22   GLUCOSE 119* 128* 126* 98  BUN 13 18 16 15   CREATININE 0.73 0.80 0.80 0.73  CALCIUM 9.6 8.5 7.8* 8.1*   GFR Estimated Creatinine Clearance: 61.2 ml/min (by C-G formula based on Cr of 0.73). Liver Function Tests:  Recent Labs Lab 11/15/12 2000 11/16/12 2215 11/18/12 0356  AST 21 61* 44*  ALT 20 44* 40*  ALKPHOS 106 78 100  BILITOT 2.5* 3.1* 1.4*  PROT 6.6 6.0 5.6*  ALBUMIN 4.1 3.5 3.1*   CBC:  Recent Labs Lab 11/15/12 2000 11/16/12 2215 11/17/12 0540 11/17/12 1115 11/18/12 0356 11/19/12 0345  WBC 23.7* 8.1  7.3 6.8 7.5 9.1  NEUTROABS 20.9* 7.2  --   --   --   --   HGB 7.7* 5.9* 6.7* 7.8* 7.8* 8.0*  HCT 24.0* 18.2* 20.1* 22.3* 23.8* 24.7*  MCV 92.3 91.5 90.1 89.9 88.8 90.1  PLT 518* 307 249 240 247 301   Anemia work up  Recent Labs  11/17/12 1946  FERRITIN 4945*   Microbiology Recent Results (from the past 240 hour(s))  URINE CULTURE     Status: None   Collection Time    11/15/12  7:18 PM      Result Value Range Status   Specimen Description URINE, CLEAN CATCH   Final   Special Requests NONE   Final   Culture  Setup Time 11/16/2012 05:07   Final   Colony Count 8,000 COLONIES/ML   Final   Culture INSIGNIFICANT GROWTH   Final   Report Status 11/17/2012 FINAL   Final  CULTURE, BLOOD (ROUTINE X 2)     Status: None   Collection Time    11/15/12  8:41 PM      Result Value Range Status   Specimen Description BLOOD RIGHT ANTECUBITAL   Final   Special Requests BOTTLES DRAWN AEROBIC AND ANAEROBIC 4CC   Final   Culture  Setup Time 11/16/2012 04:25   Final   Culture     Final   Value: STAPHYLOCOCCUS AUREUS     Note: SUSCEPTIBILITIES PERFORMED ON PREVIOUS CULTURE WITHIN THE LAST 5 DAYS.     Note: Gram Stain Report Called to,Read Back By and Verified With: PATTY CLARK 11/16/12 @ 7:16PM BY RUSCA.   Report Status 11/18/2012 FINAL   Final  CULTURE, BLOOD (ROUTINE X 2)     Status: None   Collection Time    11/15/12  8:55 PM      Result Value Range Status   Specimen Description BLOOD RIGHT FOREARM   Final   Special Requests BOTTLES DRAWN AEROBIC AND ANAEROBIC 10CC   Final   Culture  Setup Time 11/16/2012 04:25   Final   Culture     Final   Value: STAPHYLOCOCCUS AUREUS     Note: RIFAMPIN AND GENTAMICIN SHOULD NOT BE USED AS SINGLE DRUGS FOR TREATMENT OF STAPH INFECTIONS.     Note: Gram Stain Report Called to,Read Back By and Verified With: PATTY CLARK 11/16/12 @ 7:16PM BY RUSCA.   Report  Status 11/18/2012 FINAL   Final   Organism ID, Bacteria STAPHYLOCOCCUS AUREUS   Final  CULTURE, BLOOD  (ROUTINE X 2)     Status: None   Collection Time    11/17/12 12:10 AM      Result Value Range Status   Specimen Description BLOOD PORTACATH PER MD   Final   Special Requests BOTTLES DRAWN AEROBIC AND ANAEROBIC 9CC   Final   Culture  Setup Time 11/17/2012 09:07   Final   Culture     Final   Value:        BLOOD CULTURE RECEIVED NO GROWTH TO DATE CULTURE WILL BE HELD FOR 5 DAYS BEFORE ISSUING A FINAL NEGATIVE REPORT   Report Status PENDING   Incomplete  CULTURE, BLOOD (ROUTINE X 2)     Status: None   Collection Time    11/17/12 12:10 AM      Result Value Range Status   Specimen Description BLOOD PORTACATH PER MD   Final   Special Requests BOTTLES DRAWN AEROBIC AND ANAEROBIC 9CC   Final   Culture  Setup Time 11/17/2012 09:07   Final   Culture     Final   Value:        BLOOD CULTURE RECEIVED NO GROWTH TO DATE CULTURE WILL BE HELD FOR 5 DAYS BEFORE ISSUING A FINAL NEGATIVE REPORT   Report Status PENDING   Incomplete  MRSA PCR SCREENING     Status: Abnormal   Collection Time    11/17/12  1:55 AM      Result Value Range Status   MRSA by PCR POSITIVE (*) NEGATIVE Final   Comment:            The GeneXpert MRSA Assay (FDA     approved for NASAL specimens     only), is one component of a     comprehensive MRSA colonization     surveillance program. It is not     intended to diagnose MRSA     infection nor to guide or     monitor treatment for     MRSA infections.     RESULT CALLED TO, READ BACK BY AND VERIFIED WITH:     C AYERS AT 0403 ON 05.05.2014 BY NBROOKS  URINE CULTURE     Status: None   Collection Time    11/17/12  1:56 AM      Result Value Range Status   Specimen Description URINE, CLEAN CATCH   Final   Special Requests NONE   Final   Culture  Setup Time 11/17/2012 09:33   Final   Colony Count NO GROWTH   Final   Culture NO GROWTH   Final   Report Status 11/18/2012 FINAL   Final  STOOL CULTURE     Status: None   Collection Time    11/17/12  9:51 AM      Result Value  Range Status   Specimen Description STOOL   Final   Special Requests NONE   Final   Culture NO SUSPICIOUS COLONIES, CONTINUING TO HOLD   Final   Report Status PENDING   Incomplete  CLOSTRIDIUM DIFFICILE BY PCR     Status: None   Collection Time    11/19/12 12:33 AM      Result Value Range Status   C difficile by pcr NEGATIVE  NEGATIVE Final    Time coordinating discharge: 35 minutes.  Signed:  Vester Titsworth  Pager [phone] [facility] 11/20/2012, 3:53 PM

## 2012-11-21 ENCOUNTER — Ambulatory Visit: Payer: Medicare Other | Admitting: Oncology

## 2012-11-21 ENCOUNTER — Other Ambulatory Visit: Payer: Medicare Other

## 2012-11-23 LAB — CULTURE, BLOOD (ROUTINE X 2): Culture: NO GROWTH

## 2012-11-25 ENCOUNTER — Other Ambulatory Visit: Payer: Self-pay | Admitting: Oncology

## 2012-11-25 ENCOUNTER — Telehealth: Payer: Self-pay | Admitting: Oncology

## 2012-11-25 DIAGNOSIS — R7881 Bacteremia: Secondary | ICD-10-CM

## 2012-11-25 DIAGNOSIS — D649 Anemia, unspecified: Secondary | ICD-10-CM

## 2012-11-25 DIAGNOSIS — D469 Myelodysplastic syndrome, unspecified: Secondary | ICD-10-CM

## 2012-11-25 NOTE — Telephone Encounter (Signed)
Talked to pt and gave her appt for 12/05/12 lab and MD

## 2012-11-26 ENCOUNTER — Telehealth: Payer: Self-pay | Admitting: *Deleted

## 2012-11-26 NOTE — Telephone Encounter (Signed)
Hgb 7.7 drawn 5/12 from [facility].  Discussed with Dr. Cyndie Chime.  Called pt.  She is at home doing 12 days of IV antibiotics.  She is ok with her Hgb @ 7.7.  [facility] is drawing labs again Monday 5/19.  She is due to come 5/23 for labs and Dr. Reece Agar.  She will call us if she thinks her Hgb is dropping and she needs a transfusion before she comes on 5/23.  She is very aware of when she needs a transfusion and appreciated our call and concern.

## 2012-12-02 ENCOUNTER — Telehealth: Payer: Self-pay | Admitting: *Deleted

## 2012-12-02 ENCOUNTER — Telehealth: Payer: Self-pay | Admitting: Internal Medicine

## 2012-12-02 NOTE — Telephone Encounter (Signed)
Later Entry:  Jackie/[facility]/RN called yest & states that pt has PICC & ATB will be finished today & questions if PICC should be pulled.  Informed yest via vm that she should check with Dr Comer/Infectious Ds Annice Pih called backed today & informed that she received order to remove PICC but wonders if Dr Cyndie Chime would like to leave in due to poor IV access for labs & possible blood transfusions.  Call returned to [facility] & informed that Dr Cyndie Chime was agreeable to this & based on labs received today, pt should have transfusion 1st available.  She reports pt is feeling very sluggish.  Message left for Scheduler/Michelle to let us know where we can schedule her for T&CM & 2 u PRBC's.

## 2012-12-02 NOTE — Telephone Encounter (Signed)
Received a call from [facility].  Patient has now completed her IV cefazolin for 2 weeks.  Stable.  No fever or chills.  OK to pull picc from ID standpoint and [facility] will do that today.  Follow up PRN.

## 2012-12-03 ENCOUNTER — Telehealth: Payer: Self-pay | Admitting: *Deleted

## 2012-12-03 NOTE — Telephone Encounter (Signed)
Patient called.  She is feeling some better and does not want a blood transfusion at this point.  She has also had some diarrhea from her antibiotics and does not want to sit here.  She will come on Friday for lab and to see Dr. Reece Agar.  Discussed with Dr. Cyndie Chime and he is fine with this.  Called patient back and she expressed appreciation for allowing her to make this decision.

## 2012-12-05 ENCOUNTER — Other Ambulatory Visit (HOSPITAL_BASED_OUTPATIENT_CLINIC_OR_DEPARTMENT_OTHER): Payer: Medicare Other | Admitting: Lab

## 2012-12-05 ENCOUNTER — Ambulatory Visit (HOSPITAL_BASED_OUTPATIENT_CLINIC_OR_DEPARTMENT_OTHER): Payer: Medicare Other | Admitting: Oncology

## 2012-12-05 VITALS — BP 121/72 | HR 96 | Temp 97.2°F | Resp 18 | Ht 62.0 in | Wt 166.2 lb

## 2012-12-05 DIAGNOSIS — D47Z9 Other specified neoplasms of uncertain behavior of lymphoid, hematopoietic and related tissue: Secondary | ICD-10-CM

## 2012-12-05 DIAGNOSIS — R7881 Bacteremia: Secondary | ICD-10-CM

## 2012-12-05 DIAGNOSIS — D469 Myelodysplastic syndrome, unspecified: Secondary | ICD-10-CM

## 2012-12-05 DIAGNOSIS — D649 Anemia, unspecified: Secondary | ICD-10-CM

## 2012-12-05 DIAGNOSIS — A4901 Methicillin susceptible Staphylococcus aureus infection, unspecified site: Secondary | ICD-10-CM

## 2012-12-05 LAB — CBC & DIFF AND RETIC
Basophils Absolute: 0 10*3/uL (ref 0.0–0.1)
HCT: 24.5 % — ABNORMAL LOW (ref 34.8–46.6)
HGB: 7.9 g/dL — ABNORMAL LOW (ref 11.6–15.9)
Immature Retic Fract: 11.3 % — ABNORMAL HIGH (ref 1.60–10.00)
MONO#: 0.7 10*3/uL (ref 0.1–0.9)
NEUT#: 6.7 10*3/uL — ABNORMAL HIGH (ref 1.5–6.5)
NEUT%: 59 % (ref 38.4–76.8)
RDW: 23.8 % — ABNORMAL HIGH (ref 11.2–14.5)
Retic %: 2.04 % (ref 0.70–2.10)
Retic Ct Abs: 55.28 10*3/uL (ref 33.70–90.70)
WBC: 11.4 10*3/uL — ABNORMAL HIGH (ref 3.9–10.3)
lymph#: 3.6 10*3/uL — ABNORMAL HIGH (ref 0.9–3.3)

## 2012-12-05 NOTE — Progress Notes (Signed)
Progress Note:  Subjective: Follow up visit for this 72 year old woman with a myeloproliferative disorder with dysplastic features. She has become transfusion-dependent for red blood cells. She is requiring blood every 3-4 weeks. She failed a trial of Aranesp and Procrit. She was just admitted to [facility] with fever and chills on May 4. Cultures grew Staphylococcus aureus from her blood. A new porta cath infusion device had just been placed on April 1. Although the catheter idid not appear grossly infected, given the fact that cultures grew staph aureus, infectious disease consultant recommended removing the Port-A-Cath which was done. She received an initial week of antibiotics in the hospital and then was sent home on parenteral cefazolin. A transesophageal echocardiogram did not show any vegetations on her heart valves. 2 additional weeks of home antibiotics were prescribed. She has now completed her antibiotics. She is extremely fatigued. She is not having any fevers.     Vitals: Filed Vitals:   12/05/12 1559  BP: 121/72  Pulse: 96  Temp: 97.2 F (36.2 C)  Resp: 18   Wt Readings from Last 3 Encounters:  12/05/12 166 lb 3.2 oz (75.388 kg)  11/17/12 175 lb 7.8 oz (79.6 kg)  11/17/12 175 lb 7.8 oz (79.6 kg)     PHYSICAL EXAM:  General pale, Caucasian woman Head: Normal Eyes: Normal Throat: No erythema or exudate Neck: Lymph Nodes: No adenopathy Lungs: Clear to auscultation resonant to percussion Breasts:  Cardiac: Regular rhythm no murmur Abdominal: Soft, nontender, no mass, question splenomegaly Extremities: No edema, no calf tenderness. PICC catheter left antecubital fossa Vascular: No cyanosis  Neurologic motor strength 5 over 5, reflexes 1+ symmetric Skin: No rash or ecchymosis  Labs:   Recent Labs  12/05/12 1542  WBC 11.4*  HGB 7.9*  HCT 24.5*  PLT 533*   No results found for this basename: NA, K, CL, CO2, GLUCOSE, BUN, CREATININE, CALCIUM,  in  the last 72 hours    Images Studies/Results:   No results found.   Patient Active Problem List   Diagnosis Date Noted  . MDS/MPN (myelodysplastic/myeloproliferative neoplasms) 08/12/2011    Priority: High  . Staphylococcus aureus bacteremia 11/17/2012  . Hypokalemia 11/17/2012  . Anxiety and depression 11/17/2012  . Hepatosplenomegaly on CT abdomen 11/17/12 11/17/2012  . Anemia 07/16/2011    Assessment and Plan:  #1. Myeloproliferative disorder with dysplastic features.  #2. 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  #3. 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 Idiopathic Staphylococcus aureus sepsis.  #5. Status post Mohs surgery to excise a basal cell carcinoma bridge of nose    GRANFORTUNA,JAMES M 12/05/2012, 6:07 PM

## 2012-12-09 ENCOUNTER — Telehealth: Payer: Self-pay | Admitting: *Deleted

## 2012-12-09 NOTE — Telephone Encounter (Signed)
sw pt gv appt d/t for 03/10/13. Pt also wanted her schedule for her labs every weeks, she ask that i left the first appts on her home vm. i did and i also made her aware that i would mail aletter/cal as well...td

## 2012-12-12 ENCOUNTER — Encounter: Payer: Self-pay | Admitting: *Deleted

## 2012-12-15 ENCOUNTER — Encounter: Payer: Self-pay | Admitting: Oncology

## 2012-12-16 ENCOUNTER — Ambulatory Visit: Payer: Medicare Other | Admitting: Obstetrics & Gynecology

## 2012-12-17 ENCOUNTER — Ambulatory Visit (HOSPITAL_BASED_OUTPATIENT_CLINIC_OR_DEPARTMENT_OTHER): Payer: Medicare Other

## 2012-12-17 ENCOUNTER — Other Ambulatory Visit: Payer: Self-pay | Admitting: *Deleted

## 2012-12-17 ENCOUNTER — Telehealth: Payer: Self-pay | Admitting: *Deleted

## 2012-12-17 ENCOUNTER — Telehealth: Payer: Self-pay | Admitting: Oncology

## 2012-12-17 ENCOUNTER — Other Ambulatory Visit (HOSPITAL_BASED_OUTPATIENT_CLINIC_OR_DEPARTMENT_OTHER): Payer: Medicare Other | Admitting: Lab

## 2012-12-17 ENCOUNTER — Encounter (HOSPITAL_COMMUNITY)
Admission: RE | Admit: 2012-12-17 | Discharge: 2012-12-17 | Disposition: A | Discharge status: Home / Self Care | Payer: Medicare Other | Source: Ambulatory Visit | Attending: Oncology | Admitting: Oncology

## 2012-12-17 VITALS — BP 120/40 | HR 75 | Temp 98.1°F | Resp 16

## 2012-12-17 DIAGNOSIS — D469 Myelodysplastic syndrome, unspecified: Secondary | ICD-10-CM

## 2012-12-17 DIAGNOSIS — D649 Anemia, unspecified: Secondary | ICD-10-CM

## 2012-12-17 DIAGNOSIS — D47Z9 Other specified neoplasms of uncertain behavior of lymphoid, hematopoietic and related tissue: Secondary | ICD-10-CM

## 2012-12-17 DIAGNOSIS — C946 Myelodysplastic disease, not classified: Secondary | ICD-10-CM

## 2012-12-17 DIAGNOSIS — F411 Generalized anxiety disorder: Secondary | ICD-10-CM

## 2012-12-17 LAB — CBC WITH DIFFERENTIAL/PLATELET
BASO%: 1.5 % (ref 0.0–2.0)
Eosinophils Absolute: 0.4 10*3/uL (ref 0.0–0.5)
LYMPH%: 27.4 % (ref 14.0–49.7)
MCHC: 32.4 g/dL (ref 31.5–36.0)
MCV: 92 fL (ref 79.5–101.0)
MONO#: 0.9 10*3/uL (ref 0.1–0.9)
MONO%: 7 % (ref 0.0–14.0)
NEUT#: 7.6 10*3/uL — ABNORMAL HIGH (ref 1.5–6.5)
RBC: 2.26 10*6/uL — ABNORMAL LOW (ref 3.70–5.45)
RDW: 24.2 % — ABNORMAL HIGH (ref 11.2–14.5)
WBC: 12.4 10*3/uL — ABNORMAL HIGH (ref 3.9–10.3)

## 2012-12-17 LAB — PREPARE RBC (CROSSMATCH)

## 2012-12-17 MED ORDER — LORAZEPAM 2 MG/ML IJ SOLN
1.0000 mg | Freq: Once | INTRAMUSCULAR | Status: AC
  Administered 2012-12-17: 14:00:00 via INTRAVENOUS

## 2012-12-17 MED ORDER — HEPARIN SOD (PORK) LOCK FLUSH 100 UNIT/ML IV SOLN
250.0000 [IU] | INTRAVENOUS | Status: AC | PRN
  Administered 2012-12-17: 250 [IU]
  Filled 2012-12-17: qty 5

## 2012-12-17 MED ORDER — SODIUM CHLORIDE 0.9 % IJ SOLN
10.0000 mL | INTRAMUSCULAR | Status: AC | PRN
  Administered 2012-12-17: 10 mL
  Filled 2012-12-17: qty 10

## 2012-12-17 NOTE — Patient Instructions (Addendum)
Blood Transfusion Information WHAT IS A BLOOD TRANSFUSION? A transfusion is the replacement of blood or some of its parts. Blood is made up of multiple cells which provide different functions.  Red blood cells carry oxygen and are used for blood loss replacement.  White blood cells fight against infection.  Platelets control bleeding.  Plasma helps clot blood.  Other blood products are available for specialized needs, such as hemophilia or other clotting disorders. BEFORE THE TRANSFUSION  Who gives blood for transfusions?   You may be able to donate blood to be used at a later date on yourself (autologous donation).  Relatives can be asked to donate blood. This is generally not any safer than if you have received blood from a stranger. The same precautions are taken to ensure safety when a relative's blood is donated.  Healthy volunteers who are fully evaluated to make sure their blood is safe. This is blood bank blood. Transfusion therapy is the safest it has ever been in the practice of medicine. Before blood is taken from a donor, a complete history is taken to make sure that person has no history of diseases nor engages in risky social behavior (examples are intravenous drug use or sexual activity with multiple partners). The donor's travel history is screened to minimize risk of transmitting infections, such as malaria. The donated blood is tested for signs of infectious diseases, such as HIV and hepatitis. The blood is then tested to be sure it is compatible with you in order to minimize the chance of a transfusion reaction. If you or a relative donates blood, this is often done in anticipation of surgery and is not appropriate for emergency situations. It takes many days to process the donated blood. RISKS AND COMPLICATIONS Although transfusion therapy is very safe and saves many lives, the main dangers of transfusion include:   Getting an infectious disease.  Developing a  transfusion reaction. This is an allergic reaction to something in the blood you were given. Every precaution is taken to prevent this. The decision to have a blood transfusion has been considered carefully by your caregiver before blood is given. Blood is not given unless the benefits outweigh the risks. AFTER THE TRANSFUSION  Right after receiving a blood transfusion, you will usually feel much better and more energetic. This is especially true if your red blood cells have gotten low (anemic). The transfusion raises the level of the red blood cells which carry oxygen, and this usually causes an energy increase.  The nurse administering the transfusion will monitor you carefully for complications. HOME CARE INSTRUCTIONS  No special instructions are needed after a transfusion. You may find your energy is better. Speak with your caregiver about any limitations on activity for underlying diseases you may have. SEEK MEDICAL CARE IF:   Your condition is not improving after your transfusion.  You develop redness or irritation at the intravenous (IV) site. SEEK IMMEDIATE MEDICAL CARE IF:  Any of the following symptoms occur over the next 12 hours:  Shaking chills.  You have a temperature by mouth above 102 F (38.9 C), not controlled by medicine.  Chest, back, or muscle pain.  People around you feel you are not acting correctly or are confused.  Shortness of breath or difficulty breathing.  Dizziness and fainting.  You get a rash or develop hives.  You have a decrease in urine output.  Your urine turns a dark color or changes to pink, red, or brown. Any of the following   symptoms occur over the next 10 days:  You have a temperature by mouth above 102 F (38.9 C), not controlled by medicine.  Shortness of breath.  Weakness after normal activity.  The white part of the eye turns yellow (jaundice).  You have a decrease in the amount of urine or are urinating less often.  Your  urine turns a dark color or changes to pink, red, or brown. Document Released: 06/29/2000 Document Revised: 09/24/2011 Document Reviewed: 02/16/2008 Exit[employer] Patient Information 2014 [employer].  

## 2012-12-17 NOTE — Telephone Encounter (Signed)
Pt called reporting that she doesn't feel up to par today, no energy,sleeping all the time, SOB, & slt pressure in her chest which she says she has had before.  Pt informed to come in for CBC today.  POF done & scheduler notified that pt will come @ 12 noon.

## 2012-12-18 ENCOUNTER — Telehealth: Payer: Self-pay | Admitting: *Deleted

## 2012-12-18 LAB — TYPE AND SCREEN
ABO/RH(D): A POS
Unit division: 0

## 2012-12-18 NOTE — Telephone Encounter (Signed)
Pt reports that she has been looking at paperwork that she gets with visits & noticed that hydrocodone every 4 hours prn is on her med list.  She reports that she is not taking this but got new script when she left the hosp & didn't get it filled but is holding on to it & will probably get it filled before it expires but wants everybody to know she is not taking it b/c she doesn't want it to look like she has an addiction problem.  Informed pt that it is OK to leave it on her list & that this just shows that she can take it if needed & can be marked not taking when she comes for MD visits.

## 2012-12-22 ENCOUNTER — Telehealth: Payer: Self-pay | Admitting: *Deleted

## 2012-12-22 NOTE — Telephone Encounter (Signed)
Received vm call from pt stating that she is having her PICC dsg changed by nurse & would like to start the process of getting a new port b/c she doesn't like the PICC/Midline.  She has finished ATB in May & is tired of the catheter in her arm.  She reported that her Temp was 97.6, BP 122/56, Pulse 72 & O2 sat 98 % & her catheter site was pink at insertion.  Note to Dr Cyndie Chime.

## 2012-12-24 ENCOUNTER — Other Ambulatory Visit: Payer: Self-pay | Admitting: Oncology

## 2012-12-24 DIAGNOSIS — D469 Myelodysplastic syndrome, unspecified: Secondary | ICD-10-CM

## 2012-12-24 DIAGNOSIS — D649 Anemia, unspecified: Secondary | ICD-10-CM

## 2012-12-25 ENCOUNTER — Other Ambulatory Visit: Payer: Self-pay | Admitting: Radiology

## 2012-12-25 ENCOUNTER — Telehealth: Payer: Self-pay | Admitting: Oncology

## 2012-12-25 NOTE — Telephone Encounter (Signed)
lvm for Tina in IR with pt info for port

## 2012-12-26 ENCOUNTER — Other Ambulatory Visit: Payer: Medicare Other

## 2012-12-29 ENCOUNTER — Encounter (HOSPITAL_COMMUNITY): Payer: Self-pay | Admitting: Pharmacy Technician

## 2012-12-30 ENCOUNTER — Telehealth: Payer: Self-pay | Admitting: *Deleted

## 2012-12-30 ENCOUNTER — Other Ambulatory Visit: Payer: Self-pay | Admitting: *Deleted

## 2012-12-30 MED ORDER — AMOXICILLIN 500 MG PO TABS: ORAL_TABLET | ORAL | Status: DC

## 2012-12-30 NOTE — Telephone Encounter (Signed)
Received message from pt asking "please ask Dr. Reece Brennan when would be the best time for dental cleaning; port placement  01/01/13.  Should it be before or after transfusion...Marland Kitchenor wait till I'm healed"  Note to MD

## 2012-12-30 NOTE — Telephone Encounter (Signed)
Per Dr. Cyndie Chime, called and spoke with pt.  Instructed her MD recommends waiting a month after port placement before having dental procedure and instructed to take Amoxicillin 500mg  2 hours before then QID x 48 hours.  Script sent to [facility].  Pt verbalized understanding of instructions and expressed appreciation for call.

## 2013-01-01 ENCOUNTER — Ambulatory Visit (HOSPITAL_COMMUNITY)
Admission: RE | Admit: 2013-01-01 | Discharge: 2013-01-01 | Disposition: A | Discharge status: Home / Self Care | Payer: Medicare Other | Source: Ambulatory Visit | Attending: Oncology | Admitting: Oncology

## 2013-01-01 ENCOUNTER — Encounter (HOSPITAL_COMMUNITY): Payer: Self-pay

## 2013-01-01 ENCOUNTER — Other Ambulatory Visit: Payer: Self-pay | Admitting: Oncology

## 2013-01-01 ENCOUNTER — Other Ambulatory Visit: Payer: Self-pay | Admitting: *Deleted

## 2013-01-01 DIAGNOSIS — D649 Anemia, unspecified: Secondary | ICD-10-CM

## 2013-01-01 DIAGNOSIS — Z79899 Other long term (current) drug therapy: Secondary | ICD-10-CM | POA: Insufficient documentation

## 2013-01-01 DIAGNOSIS — D469 Myelodysplastic syndrome, unspecified: Secondary | ICD-10-CM | POA: Insufficient documentation

## 2013-01-01 DIAGNOSIS — Z87891 Personal history of nicotine dependence: Secondary | ICD-10-CM | POA: Insufficient documentation

## 2013-01-01 LAB — PROTIME-INR
INR: 1.09 (ref 0.00–1.49)
Prothrombin Time: 14 seconds (ref 11.6–15.2)

## 2013-01-01 LAB — CBC
MCH: 30.5 pg (ref 26.0–34.0)
MCHC: 33.6 g/dL (ref 30.0–36.0)
Platelets: 497 10*3/uL — ABNORMAL HIGH (ref 150–400)
RDW: 22.5 % — ABNORMAL HIGH (ref 11.5–15.5)

## 2013-01-01 LAB — APTT: aPTT: 42 seconds — ABNORMAL HIGH (ref 24–37)

## 2013-01-01 MED ORDER — FENTANYL CITRATE 0.05 MG/ML IJ SOLN
INTRAMUSCULAR | Status: AC | PRN
  Administered 2013-01-01: 100 ug via INTRAVENOUS

## 2013-01-01 MED ORDER — LIDOCAINE-PRILOCAINE 2.5-2.5 % EX CREA: TOPICAL_CREAM | CUTANEOUS | Status: DC | PRN

## 2013-01-01 MED ORDER — MIDAZOLAM HCL 2 MG/2ML IJ SOLN
INTRAMUSCULAR | Status: AC | PRN
  Administered 2013-01-01: 2 mg via INTRAVENOUS

## 2013-01-01 MED ORDER — MIDAZOLAM HCL 2 MG/2ML IJ SOLN
INTRAMUSCULAR | Status: AC
  Filled 2013-01-01: qty 4

## 2013-01-01 MED ORDER — LIDOCAINE HCL 1 % IJ SOLN
INTRAMUSCULAR | Status: AC
  Filled 2013-01-01: qty 20

## 2013-01-01 MED ORDER — SODIUM CHLORIDE 0.9 % IV SOLN
INTRAVENOUS | Status: DC
  Administered 2013-01-01: 12:00:00 via INTRAVENOUS

## 2013-01-01 MED ORDER — CEFAZOLIN SODIUM-DEXTROSE 2-3 GM-% IV SOLR
2.0000 g | INTRAVENOUS | Status: AC
  Administered 2013-01-01: 2 g via INTRAVENOUS
  Filled 2013-01-01: qty 50

## 2013-01-01 MED ORDER — FENTANYL CITRATE 0.05 MG/ML IJ SOLN
INTRAMUSCULAR | Status: AC
  Filled 2013-01-01: qty 4

## 2013-01-01 NOTE — Telephone Encounter (Signed)
Received message from pt requesting EMLA cream for port.  Called and spoke with pt informing her script has been sent for cream.  Pt verbalized understanding and stated that port placement today went well.

## 2013-01-01 NOTE — H&P (Signed)
Chief Complaint: "I'm here for a new port" Referring Physician:[place] HPI: Carolyn Brennan is an 72 y.o. 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 She has been using a PICC. She is here today for a new port placement. PMHx and meds reviewed. She otherwise feels well, no recent fevers or illness.  Past Medical History:  Past Medical History  Diagnosis Date  . 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 Anxiety and depression 08/12/2011  . Anemia     Past Surgical History:  Past Surgical History  Procedure Laterality Date  . Neck plastic surgery    . Abdominoplasty/panniculectomy    . Tubal ligation    . Breast reduction surgery    . Appendectomy    . Tee without cardioversion N/A 11/19/2012    Procedure: TRANSESOPHAGEAL ECHOCARDIOGRAM (TEE);  Surgeon: Corky Crafts, MD;  Location: [facility] ENDOSCOPY;  Service: Cardiovascular;  Laterality: N/A;    Family History:  Family History  Problem Relation Age of Onset  . Breast cancer Mother   . Colon cancer Father 79    Social History:  reports that she has quit smoking. She started smoking about 20 years ago. She does not have any smokeless tobacco history on file. She reports that she does not drink alcohol or use illicit drugs.  Allergies: No Known Allergies  Medications: aspirin EC 81 MG tablet (Taking) Sig - Route: Take 81 mg by mouth daily. - Oral Class: Historical Med diphenhydrAMINE (BENADRYL) 25 MG tablet (Taking) Sig - Route: Take 25 mg by mouth at bedtime as needed for sleep. - Oral Class: Historical Med Number of times this order has been changed since signing: 3 Order [address] ergocalciferol (VITAMIN D2) 50000 UNITS capsule (Taking) Sig - Route: Take 50,000 Units by mouth 2 (two) times a week. On mondays and fridays - Oral Class: Historical Med Number of times this order has been changed since signing: 4 Order [address]  FLUoxetine (PROZAC) 40 MG capsule (Taking) Sig - Route: Take 40 mg by mouth daily before breakfast. - Oral Class: Historical Med    Please HPI for pertinent positives, otherwise complete 10 system ROS negative.  Physical Exam: Temp: 98.2, HR: 79, RR: 16, BP: 119/98  General Appearance:  Alert, cooperative, no distress, appears stated age  Head:  Normocephalic, without obvious abnormality, atraumatic  ENT: Unremarkable  Neck: Supple, symmetrical, trachea midline  Lungs:   Clear to auscultation bilaterally, no w/r/r, respirations unlabored without use of accessory muscles.  Chest Wall:  Rt port removal site incision healed well.  Heart:  Regular rate and rhythm, S1, S2 normal, no murmur, rub or gallop.  Neurologic: Normal affect, no gross deficits.   No results found for this or any previous visit (from the past 48 hour(s)). No results found.  Assessment/Plan MDS Needs new port Discussed procedure, risks, complications, use of sedation Labs pending Wants PICC removed after Port in place. Consent signed in chart  Brayton El PA-C 01/01/2013, 11:43 AM

## 2013-01-01 NOTE — Procedures (Signed)
Procedure:  Right IJ porta-cath Access:  Right IJ vein Cath tip at cavoatrial junction.  No PTX.  OK to use.

## 2013-01-01 NOTE — H&P (Signed)
Agree 

## 2013-01-05 ENCOUNTER — Telehealth: Payer: Self-pay | Admitting: *Deleted

## 2013-01-05 NOTE — Telephone Encounter (Signed)
Brayton Caves called reporting patient discharged today as P.I.C.C. Line was pulled last week and a new port was placed today.  She was under P.I.C.C. maintenance only.

## 2013-01-13 ENCOUNTER — Telehealth: Payer: Self-pay | Admitting: *Deleted

## 2013-01-13 NOTE — Telephone Encounter (Signed)
Spoke with patient.  She has a lab appt for CBC on 01/15/13.  She is feeling very good and wonders if she should postpone it.  Let her know that we are glad she is feeling so good, however her last CBC was 6/19 and we would like to have an idea of where she is tracking with her Hgb.

## 2013-01-15 ENCOUNTER — Other Ambulatory Visit (HOSPITAL_BASED_OUTPATIENT_CLINIC_OR_DEPARTMENT_OTHER): Payer: Medicare Other | Admitting: Lab

## 2013-01-15 ENCOUNTER — Telehealth: Payer: Self-pay | Admitting: *Deleted

## 2013-01-15 DIAGNOSIS — D47Z9 Other specified neoplasms of uncertain behavior of lymphoid, hematopoietic and related tissue: Secondary | ICD-10-CM

## 2013-01-15 DIAGNOSIS — D469 Myelodysplastic syndrome, unspecified: Secondary | ICD-10-CM

## 2013-01-15 DIAGNOSIS — D649 Anemia, unspecified: Secondary | ICD-10-CM

## 2013-01-15 LAB — CBC & DIFF AND RETIC
Basophils Absolute: 0 10*3/uL (ref 0.0–0.1)
EOS%: 2.6 % (ref 0.0–7.0)
Eosinophils Absolute: 0.3 10*3/uL (ref 0.0–0.5)
HGB: 6.5 g/dL — CL (ref 11.6–15.9)
LYMPH%: 27.3 % (ref 14.0–49.7)
MCH: 29.8 pg (ref 25.1–34.0)
MCV: 93.6 fL (ref 79.5–101.0)
MONO%: 6 % (ref 0.0–14.0)
NEUT#: 6.6 10*3/uL — ABNORMAL HIGH (ref 1.5–6.5)
NEUT%: 63.9 % (ref 38.4–76.8)
Platelets: 441 10*3/uL — ABNORMAL HIGH (ref 145–400)

## 2013-01-15 NOTE — Telephone Encounter (Signed)
Spoke with pt in lobby, she feels well. Does not feel like she needs to be transfused. CBC reviewed by Dr. Truett Perna. Order received to bring pt in on 7/7 for lab and possible transfusion. Pt understands and agrees with plan. Appt scheduled.

## 2013-01-19 ENCOUNTER — Ambulatory Visit (HOSPITAL_BASED_OUTPATIENT_CLINIC_OR_DEPARTMENT_OTHER): Payer: Medicare Other

## 2013-01-19 ENCOUNTER — Other Ambulatory Visit (HOSPITAL_BASED_OUTPATIENT_CLINIC_OR_DEPARTMENT_OTHER): Payer: Medicare Other | Admitting: Lab

## 2013-01-19 ENCOUNTER — Encounter (HOSPITAL_COMMUNITY)
Admission: RE | Admit: 2013-01-19 | Discharge: 2013-01-19 | Disposition: A | Discharge status: Home / Self Care | Payer: Medicare Other | Source: Ambulatory Visit | Attending: Oncology | Admitting: Oncology

## 2013-01-19 VITALS — BP 124/37 | HR 71 | Temp 97.8°F | Resp 18

## 2013-01-19 DIAGNOSIS — D47Z9 Other specified neoplasms of uncertain behavior of lymphoid, hematopoietic and related tissue: Secondary | ICD-10-CM

## 2013-01-19 DIAGNOSIS — D649 Anemia, unspecified: Secondary | ICD-10-CM

## 2013-01-19 DIAGNOSIS — D469 Myelodysplastic syndrome, unspecified: Secondary | ICD-10-CM

## 2013-01-19 LAB — CBC WITH DIFFERENTIAL/PLATELET
Basophils Absolute: 0 10*3/uL (ref 0.0–0.1)
Eosinophils Absolute: 0.3 10*3/uL (ref 0.0–0.5)
HCT: 20.7 % — ABNORMAL LOW (ref 34.8–46.6)
HGB: 7.1 g/dL — ABNORMAL LOW (ref 11.6–15.9)
LYMPH%: 26.9 % (ref 14.0–49.7)
MCV: 94.8 fL (ref 79.5–101.0)
MONO#: 0.6 10*3/uL (ref 0.1–0.9)
NEUT#: 6.7 10*3/uL — ABNORMAL HIGH (ref 1.5–6.5)
NEUT%: 64.1 % (ref 38.4–76.8)
Platelets: 361 10*3/uL (ref 145–400)
RBC: 2.18 10*6/uL — ABNORMAL LOW (ref 3.70–5.45)
WBC: 10.5 10*3/uL — ABNORMAL HIGH (ref 3.9–10.3)

## 2013-01-19 LAB — HOLD TUBE, BLOOD BANK

## 2013-01-19 MED ORDER — HEPARIN SOD (PORK) LOCK FLUSH 100 UNIT/ML IV SOLN
500.0000 [IU] | Freq: Every day | INTRAVENOUS | Status: AC | PRN
  Administered 2013-01-19: 500 [IU]
  Filled 2013-01-19: qty 5

## 2013-01-19 MED ORDER — SODIUM CHLORIDE 0.9 % IV SOLN
250.0000 mL | Freq: Once | INTRAVENOUS | Status: AC
  Administered 2013-01-19: 250 mL via INTRAVENOUS

## 2013-01-19 MED ORDER — SODIUM CHLORIDE 0.9 % IJ SOLN
10.0000 mL | INTRAMUSCULAR | Status: AC | PRN
  Administered 2013-01-19: 10 mL
  Filled 2013-01-19: qty 10

## 2013-01-19 MED ORDER — LORAZEPAM 2 MG/ML IJ SOLN
1.0000 mg | Freq: Once | INTRAMUSCULAR | Status: AC
  Administered 2013-01-19: 1 mg via INTRAVENOUS

## 2013-01-20 LAB — TYPE AND SCREEN
ABO/RH(D): A POS
Antibody Screen: NEGATIVE
Unit division: 0

## 2013-02-06 ENCOUNTER — Other Ambulatory Visit (HOSPITAL_BASED_OUTPATIENT_CLINIC_OR_DEPARTMENT_OTHER): Payer: Medicare Other | Admitting: Lab

## 2013-02-06 DIAGNOSIS — D649 Anemia, unspecified: Secondary | ICD-10-CM

## 2013-02-06 DIAGNOSIS — D47Z9 Other specified neoplasms of uncertain behavior of lymphoid, hematopoietic and related tissue: Secondary | ICD-10-CM

## 2013-02-06 DIAGNOSIS — D469 Myelodysplastic syndrome, unspecified: Secondary | ICD-10-CM

## 2013-02-06 LAB — CBC & DIFF AND RETIC
BASO%: 0.2 % (ref 0.0–2.0)
LYMPH%: 23.9 % (ref 14.0–49.7)
MCHC: 32.7 g/dL (ref 31.5–36.0)
MCV: 91.2 fL (ref 79.5–101.0)
MONO%: 6.5 % (ref 0.0–14.0)
Platelets: 540 10*3/uL — ABNORMAL HIGH (ref 145–400)
RBC: 2.72 10*6/uL — ABNORMAL LOW (ref 3.70–5.45)
Retic %: 3.14 % — ABNORMAL HIGH (ref 0.70–2.10)
WBC: 11.9 10*3/uL — ABNORMAL HIGH (ref 3.9–10.3)

## 2013-02-18 ENCOUNTER — Telehealth: Payer: Self-pay | Admitting: Oncology

## 2013-02-18 ENCOUNTER — Other Ambulatory Visit: Payer: Self-pay | Admitting: Oncology

## 2013-02-18 ENCOUNTER — Telehealth: Payer: Self-pay | Admitting: *Deleted

## 2013-02-18 DIAGNOSIS — D649 Anemia, unspecified: Secondary | ICD-10-CM

## 2013-02-18 DIAGNOSIS — D469 Myelodysplastic syndrome, unspecified: Secondary | ICD-10-CM

## 2013-02-18 NOTE — Telephone Encounter (Signed)
Added additional lb appts for 8/8, 8/22, 9/5. appts for lb/fu 8/26 remain the same. lmonvm for pt on both home/cell confirming each appt d/t. Pt to get new schedule when she comes in 8/8.

## 2013-02-18 NOTE — Telephone Encounter (Signed)
Spoke with pt that scheduler will call to make appt for this Friday 02/20/13 to check lab.  Pt verbalized understanding.

## 2013-02-18 NOTE — Telephone Encounter (Signed)
Received call from pt asking "Shouldn't I come in earlier before 03/10/13 to re-check my blood work"  Pt states she feels fine now but thinks she should have earlier lab appt "in case I need blood"  Last blood transfusion  01/19/13 for Hgb 7.1--received 2 units.  Note to Dr. Cyndie Chime.

## 2013-02-20 ENCOUNTER — Other Ambulatory Visit (HOSPITAL_BASED_OUTPATIENT_CLINIC_OR_DEPARTMENT_OTHER): Payer: Medicare Other

## 2013-02-20 DIAGNOSIS — R7881 Bacteremia: Secondary | ICD-10-CM

## 2013-02-20 DIAGNOSIS — D47Z9 Other specified neoplasms of uncertain behavior of lymphoid, hematopoietic and related tissue: Secondary | ICD-10-CM

## 2013-02-20 DIAGNOSIS — D469 Myelodysplastic syndrome, unspecified: Secondary | ICD-10-CM

## 2013-02-20 DIAGNOSIS — D649 Anemia, unspecified: Secondary | ICD-10-CM

## 2013-02-20 LAB — CBC WITH DIFFERENTIAL/PLATELET
BASO%: 0.3 % (ref 0.0–2.0)
EOS%: 2.7 % (ref 0.0–7.0)
HCT: 22.6 % — ABNORMAL LOW (ref 34.8–46.6)
LYMPH%: 24.8 % (ref 14.0–49.7)
MCH: 31.5 pg (ref 25.1–34.0)
MCHC: 33.3 g/dL (ref 31.5–36.0)
MCV: 94.6 fL (ref 79.5–101.0)
NEUT%: 65.7 % (ref 38.4–76.8)
Platelets: 399 10*3/uL (ref 145–400)

## 2013-02-23 ENCOUNTER — Other Ambulatory Visit: Payer: Self-pay | Admitting: *Deleted

## 2013-02-23 ENCOUNTER — Encounter (HOSPITAL_COMMUNITY)
Admission: RE | Admit: 2013-02-23 | Discharge: 2013-02-23 | Disposition: A | Discharge status: Home / Self Care | Payer: Medicare Other | Source: Ambulatory Visit | Attending: Oncology | Admitting: Oncology

## 2013-02-23 ENCOUNTER — Ambulatory Visit (HOSPITAL_BASED_OUTPATIENT_CLINIC_OR_DEPARTMENT_OTHER): Payer: Medicare Other

## 2013-02-23 ENCOUNTER — Other Ambulatory Visit (HOSPITAL_BASED_OUTPATIENT_CLINIC_OR_DEPARTMENT_OTHER): Payer: Medicare Other

## 2013-02-23 VITALS — BP 125/62 | HR 75 | Temp 97.6°F | Resp 18

## 2013-02-23 DIAGNOSIS — D469 Myelodysplastic syndrome, unspecified: Secondary | ICD-10-CM

## 2013-02-23 DIAGNOSIS — D649 Anemia, unspecified: Secondary | ICD-10-CM

## 2013-02-23 DIAGNOSIS — D47Z9 Other specified neoplasms of uncertain behavior of lymphoid, hematopoietic and related tissue: Secondary | ICD-10-CM

## 2013-02-23 LAB — CBC WITH DIFFERENTIAL/PLATELET
BASO%: 1.3 % (ref 0.0–2.0)
EOS%: 2.9 % (ref 0.0–7.0)
HCT: 21.6 % — ABNORMAL LOW (ref 34.8–46.6)
LYMPH%: 25.6 % (ref 14.0–49.7)
MCH: 31.2 pg (ref 25.1–34.0)
MCHC: 32.7 g/dL (ref 31.5–36.0)
MCV: 95.6 fL (ref 79.5–101.0)
MONO#: 0.5 10*3/uL (ref 0.1–0.9)
MONO%: 5.8 % (ref 0.0–14.0)
NEUT%: 64.4 % (ref 38.4–76.8)
Platelets: 385 10*3/uL (ref 145–400)

## 2013-02-23 MED ORDER — SODIUM CHLORIDE 0.9 % IJ SOLN
10.0000 mL | INTRAMUSCULAR | Status: AC | PRN
  Administered 2013-02-23: 10 mL
  Filled 2013-02-23: qty 10

## 2013-02-23 MED ORDER — LORAZEPAM 2 MG/ML IJ SOLN
1.0000 mg | Freq: Once | INTRAMUSCULAR | Status: AC
  Administered 2013-02-23: 1 mg via INTRAVENOUS

## 2013-02-23 MED ORDER — HEPARIN SOD (PORK) LOCK FLUSH 100 UNIT/ML IV SOLN
500.0000 [IU] | Freq: Every day | INTRAVENOUS | Status: AC | PRN
Start: 2013-02-23 — End: 2013-02-23
  Administered 2013-02-23: 500 [IU]
  Filled 2013-02-23: qty 5

## 2013-02-23 MED ORDER — SODIUM CHLORIDE 0.9 % IV SOLN
250.0000 mL | Freq: Once | INTRAVENOUS | Status: AC
  Administered 2013-02-23: 250 mL via INTRAVENOUS

## 2013-02-23 NOTE — Patient Instructions (Addendum)
Blood Transfusion  A blood transfusion replaces your blood or some of its parts. Blood is replaced when you have lost blood because of surgery, an accident, or for severe blood conditions like anemia. You can donate blood to be used on yourself if you have a planned surgery. If you lose blood during that surgery, your own blood can be given back to you. Any blood given to you is checked to make sure it matches your blood type. Your temperature, blood pressure, and heart rate (vital signs) will be checked often.  GET HELP RIGHT AWAY IF:   You feel sick to your stomach (nauseous) or throw up (vomit).  You have watery poop (diarrhea).  You have shortness of breath or trouble breathing.  You have blood in your pee (urine) or have dark colored pee.  You have chest pain or tightness.  Your eyes or skin turn yellow (jaundice).  You have a temperature by mouth above 102 F (38.9 C), not controlled by medicine.  You start to shake and have chills.  You develop a a red rash (hives) or feel itchy.  You develop lightheadedness or feel confused.  You develop back, joint, or muscle pain.  You do not feel hungry (lost appetite).  You feel tired, restless, or nervous.  You develop belly (abdominal) cramps. Document Released: 09/28/2008 Document Revised: 09/24/2011 Document Reviewed: 09/28/2008 ExitCare Patient Information 2014 [employer].  

## 2013-02-24 LAB — TYPE AND SCREEN: Unit division: 0

## 2013-03-06 ENCOUNTER — Other Ambulatory Visit: Payer: Medicare Other | Admitting: Lab

## 2013-03-06 ENCOUNTER — Other Ambulatory Visit: Payer: Medicare Other

## 2013-03-10 ENCOUNTER — Other Ambulatory Visit (HOSPITAL_BASED_OUTPATIENT_CLINIC_OR_DEPARTMENT_OTHER): Payer: Medicare Other | Admitting: Lab

## 2013-03-10 ENCOUNTER — Ambulatory Visit (HOSPITAL_BASED_OUTPATIENT_CLINIC_OR_DEPARTMENT_OTHER): Payer: Medicare Other | Admitting: Oncology

## 2013-03-10 VITALS — BP 95/62 | HR 82 | Temp 98.0°F | Resp 18 | Ht 62.0 in | Wt 166.3 lb

## 2013-03-10 DIAGNOSIS — R7881 Bacteremia: Secondary | ICD-10-CM

## 2013-03-10 DIAGNOSIS — D47Z9 Other specified neoplasms of uncertain behavior of lymphoid, hematopoietic and related tissue: Secondary | ICD-10-CM

## 2013-03-10 DIAGNOSIS — D469 Myelodysplastic syndrome, unspecified: Secondary | ICD-10-CM

## 2013-03-10 DIAGNOSIS — D649 Anemia, unspecified: Secondary | ICD-10-CM

## 2013-03-10 LAB — CBC WITH DIFFERENTIAL/PLATELET
Basophils Absolute: 0 10*3/uL (ref 0.0–0.1)
Eosinophils Absolute: 0.4 10*3/uL (ref 0.0–0.5)
HGB: 8.3 g/dL — ABNORMAL LOW (ref 11.6–15.9)
MCV: 93.5 fL (ref 79.5–101.0)
NEUT#: 7.8 10*3/uL — ABNORMAL HIGH (ref 1.5–6.5)
RDW: 26.1 % — ABNORMAL HIGH (ref 11.2–14.5)
lymph#: 3.3 10*3/uL (ref 0.9–3.3)

## 2013-03-10 LAB — COMPREHENSIVE METABOLIC PANEL (CC13)
ALT: 41 U/L (ref 0–55)
Alkaline Phosphatase: 88 U/L (ref 40–150)
CO2: 26 mEq/L (ref 22–29)
Creatinine: 0.7 mg/dL (ref 0.6–1.1)
Total Bilirubin: 2.16 mg/dL — ABNORMAL HIGH (ref 0.20–1.20)

## 2013-03-10 LAB — LACTATE DEHYDROGENASE (CC13): LDH: 167 U/L (ref 125–245)

## 2013-03-12 ENCOUNTER — Other Ambulatory Visit: Payer: Self-pay | Admitting: *Deleted

## 2013-03-12 MED ORDER — DEFERASIROX 500 MG PO TBSO: ORAL_TABLET | ORAL | Status: DC

## 2013-03-12 NOTE — Progress Notes (Signed)
Hematology and Oncology Follow Up Visit  MARGARETHA MAHAN [id number] 04-02-1941 72 y.o. 03/12/2013 7:51 AM   Principle Diagnosis: Encounter Diagnoses  Name Primary?  [name] MDS/MPN (myelodysplastic/myeloproliferative neoplasms) Yes  . Anemia      Interim History:     Follow up visit for this 72 year old woman with a myeloproliferative disorder with dysplastic features. She has become transfusion-dependent for red blood cells. She is requiring blood every 3-4 weeks. She failed a trial of Aranesp and Procrit. Blood counts as far back as July 2007 showed a macrocytic anemia with hemoglobin 11 g, leukocytosis with white count 13,600, and mild elevation of her platelet count of 399,000. She was referred to our office in April 2008. Initial bone marrow done by my partner Dr. Myna Hidalgo showed a hypercellular marrow with ring sideroblasts. Routine cytogenetics were normal. FISH studies not done on the marrow. She tested negative for the JAK-2 mutation. Most recent bone marrow done 01/25/2011 shows normal cytogenetics. No evidence for the 5Q deletion on FISH. This marrow changed little compared with the initial study done at diagnosis in April of 2008. Marrow hypercellular for age 35% with prominent erythroid proliferation, megakaryocyte proliferation with the large and abnormal forms. No excess blasts. Normal maturation in the myeloid series. dysplastic changes in the erythroid series.   She was admitted to [facility] with fever and chills on Nov 16, 2012 Cultures grew Staphylococcus aureus from her blood. A new porta cath infusion device had just been placed on April 1. Although the catheter did not appear grossly infected, given the fact that cultures grew staph aureus, infectious disease consultant recommended removing the Port-A-Cath which was done. She received an initial week of antibiotics in the hospital and then was sent home on parenteral cefazolin. A transesophageal echocardiogram did not show  any vegetations on her heart valves. 2 additional weeks of home antibiotics were prescribed. A new Port-A-Cath subsequently replaced.  Since treatment of staph infection she feels like a new person. She is regaining a lot of her lost energy. She is not as depressed. She is wearing a T-shirt today that I gave her "outrageous older woman".     Medications: reviewed  Allergies: No Known Allergies  Review of Systems: Constitutional:   Fluctuating fatigue secondary to chronic anemia Respiratory: No cough or dyspnea Cardiovascular:  No chest pain or palpitations Gastrointestinal: No abdominal pain or change in bowel habit Genito-Urinary: No urinary tract symptoms Musculoskeletal: No muscle bone or joint pain Neurologic: No headache or change in vision Skin: No rash Remaining ROS negative.  Physical Exam: Blood pressure 95/62, pulse 82, temperature 98 F (36.7 C), temperature source Oral, resp. rate 18, height 5\' 2"  (1.575 m), weight 166 lb 4.8 oz (75.433 kg), SpO2 97.00%. Wt Readings from Last 3 Encounters:  03/10/13 166 lb 4.8 oz (75.433 kg)  12/05/12 166 lb 3.2 oz (75.388 kg)  11/17/12 175 lb 7.8 oz (79.6 kg)     General appearance: Well-nourished Caucasian woman HENNT: Pharynx no erythema or exudate Lymph nodes: No adenopathy Breasts: Lungs: Clear to auscultation resonant to percussion Heart: Regular rhythm no murmur Abdomen: Soft, nontender, no mass, no organomegaly Extremities: No edema, no calf tenderness Musculoskeletal: No joint deformities GU: Vascular: No cyanosis Neurologic: No focal deficit Skin: No rash or ecchymosis  Lab Results: Lab Results  Component Value Date   WBC 12.1* 03/10/2013   HGB 8.3* 03/10/2013   HCT 26.1* 03/10/2013   MCV 93.5 03/10/2013   PLT 527* 03/10/2013  Chemistry      Component Value Date/Time   NA 140 03/10/2013 1447   NA 136 11/18/2012 0356   K 4.6 03/10/2013 1447   K 4.0 11/18/2012 0356   CL 106 11/18/2012 0356   CL 103 09/01/2012  1453   CO2 26 03/10/2013 1447   CO2 22 11/18/2012 0356   BUN 13.2 03/10/2013 1447   BUN 15 11/18/2012 0356   CREATININE 0.7 03/10/2013 1447   CREATININE 0.73 11/18/2012 0356      Component Value Date/Time   CALCIUM 9.3 03/10/2013 1447   CALCIUM 8.1* 11/18/2012 0356   ALKPHOS 88 03/10/2013 1447   ALKPHOS 100 11/18/2012 0356   AST 41* 03/10/2013 1447   AST 44* 11/18/2012 0356   ALT 41 03/10/2013 1447   ALT 40* 11/18/2012 0356   BILITOT 2.16* 03/10/2013 1447   BILITOT 1.4* 11/18/2012 0356    Ferritin: 4945 on 11/17/2012 (this value may be exaggerated secondary to acute staph sepsis). Prior value 1882 on February 17.    Impression: #1. Myeloproliferative disorder with dysplastic features.  Plan continue periodic blood monitoring and transfusions as needed.   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 We discussed this again today. She is willing to try the drug. I will start her out at 20 mg per kilogram. She weighs 75 kg. Total dose 1500 mg equals 3 500 mg tablets take once daily on an empty stomach. .  #3. Chronic anxiety and depression In a much better frame of mind today which she attributes to treatment of staph sepsis.  #4. History of staph aureus sepsis likely related to infected Port-A-Cath infusion device 5/14    CC:. Dr. Merlene Laughter   Levert Feinstein, MD 8/28/20147:51 AM

## 2013-03-13 NOTE — Telephone Encounter (Signed)
RECEIVED A FAX FROM [employer] [facility]ERNING A NOTICE OF RX TRANSFER TO [facility].

## 2013-03-18 ENCOUNTER — Other Ambulatory Visit: Payer: Self-pay | Admitting: *Deleted

## 2013-03-18 ENCOUNTER — Encounter: Payer: Self-pay | Admitting: Oncology

## 2013-03-18 ENCOUNTER — Telehealth: Payer: Self-pay | Admitting: *Deleted

## 2013-03-18 DIAGNOSIS — D47Z9 Other specified neoplasms of uncertain behavior of lymphoid, hematopoietic and related tissue: Secondary | ICD-10-CM

## 2013-03-18 MED ORDER — AMOXICILLIN 500 MG PO TABS: ORAL_TABLET | ORAL | Status: DC

## 2013-03-18 NOTE — Progress Notes (Signed)
[facility], [id number], approved exjade 500mg  from 03/18/13-03/18/14 under Medicare part B id# [id number].

## 2013-03-18 NOTE — Telephone Encounter (Signed)
Received call from pt stating that she heard from [facility] & was told that she would pay $2000.00/mo for exjade & up to $4550.00 total/year.  She was very upset & said she wouldn't do it but has now decided to pay this.  She says she won't start this until Thanksgiving & will discuss this with Dr Cyndie Chime.  Pt also wanted an amoxicillin script to have on hand for dental procedures.  Script sent to pharmacy to keep on hold as needed.

## 2013-03-19 ENCOUNTER — Telehealth: Payer: Self-pay | Admitting: Oncology

## 2013-03-20 ENCOUNTER — Other Ambulatory Visit: Payer: Medicare Other | Admitting: Lab

## 2013-03-20 ENCOUNTER — Other Ambulatory Visit: Payer: Medicare Other

## 2013-03-24 ENCOUNTER — Other Ambulatory Visit: Payer: Medicare Other

## 2013-03-24 ENCOUNTER — Other Ambulatory Visit (HOSPITAL_BASED_OUTPATIENT_CLINIC_OR_DEPARTMENT_OTHER): Payer: Medicare Other | Admitting: Lab

## 2013-03-24 DIAGNOSIS — D47Z9 Other specified neoplasms of uncertain behavior of lymphoid, hematopoietic and related tissue: Secondary | ICD-10-CM

## 2013-03-24 DIAGNOSIS — D649 Anemia, unspecified: Secondary | ICD-10-CM

## 2013-03-24 DIAGNOSIS — D469 Myelodysplastic syndrome, unspecified: Secondary | ICD-10-CM

## 2013-03-24 LAB — CBC WITH DIFFERENTIAL/PLATELET
Basophils Absolute: 0.1 10*3/uL (ref 0.0–0.1)
Eosinophils Absolute: 0.4 10*3/uL (ref 0.0–0.5)
HGB: 8.1 g/dL — ABNORMAL LOW (ref 11.6–15.9)
LYMPH%: 22.7 % (ref 14.0–49.7)
MCV: 93.4 fL (ref 79.5–101.0)
MONO%: 5.7 % (ref 0.0–14.0)
NEUT#: 9.5 10*3/uL — ABNORMAL HIGH (ref 1.5–6.5)
Platelets: 547 10*3/uL — ABNORMAL HIGH (ref 145–400)

## 2013-03-26 ENCOUNTER — Telehealth: Payer: Self-pay | Admitting: *Deleted

## 2013-03-26 NOTE — Telephone Encounter (Signed)
Patient called to say that she was going to call [facility] and start process to begin taking the exjade.

## 2013-03-27 ENCOUNTER — Other Ambulatory Visit: Payer: Self-pay | Admitting: *Deleted

## 2013-03-27 ENCOUNTER — Telehealth: Payer: Self-pay | Admitting: *Deleted

## 2013-03-27 DIAGNOSIS — D469 Myelodysplastic syndrome, unspecified: Secondary | ICD-10-CM

## 2013-03-27 NOTE — Telephone Encounter (Signed)
Pt called & reports that she is feeling very tired & would like to come in Monday for lab & possible blood transfusion.  She reports no other symptoms other than being cold & states she turned off her air conditioner. Discussed with Dr Cyndie Chime & OK for lab on Monday.  POF routed to scheduler.

## 2013-03-27 NOTE — Telephone Encounter (Signed)
Talked to pt and gave her appt for lab appt 9/15

## 2013-03-30 ENCOUNTER — Other Ambulatory Visit (HOSPITAL_BASED_OUTPATIENT_CLINIC_OR_DEPARTMENT_OTHER): Payer: Medicare Other

## 2013-03-30 DIAGNOSIS — D649 Anemia, unspecified: Secondary | ICD-10-CM

## 2013-03-30 DIAGNOSIS — D469 Myelodysplastic syndrome, unspecified: Secondary | ICD-10-CM

## 2013-03-30 LAB — CBC WITH DIFFERENTIAL/PLATELET
Basophils Absolute: 0 10*3/uL (ref 0.0–0.1)
EOS%: 2.9 % (ref 0.0–7.0)
HCT: 23.3 % — ABNORMAL LOW (ref 34.8–46.6)
HGB: 7.6 g/dL — ABNORMAL LOW (ref 11.6–15.9)
MCH: 32.2 pg (ref 25.1–34.0)
MCV: 97.8 fL (ref 79.5–101.0)
MONO%: 6.7 % (ref 0.0–14.0)
NEUT%: 64.4 % (ref 38.4–76.8)
lymph#: 2.8 10*3/uL (ref 0.9–3.3)

## 2013-04-03 ENCOUNTER — Telehealth: Payer: Self-pay | Admitting: *Deleted

## 2013-04-03 ENCOUNTER — Telehealth: Payer: Self-pay | Admitting: Oncology

## 2013-04-03 ENCOUNTER — Encounter: Payer: Self-pay | Admitting: Oncology

## 2013-04-03 NOTE — Progress Notes (Signed)
Patient left messages. I called back and she advised all taken care of. She had questions about the Exjade--I advised her of approval thru 03/18/14.

## 2013-04-03 NOTE — Telephone Encounter (Signed)
Talked to pt and she r/s lab from Tuesday to Monday 04/06/12

## 2013-04-03 NOTE — Telephone Encounter (Signed)
Patient called.  She would like to change her appt. From Tues. 9/23 to Monday 9/22.  Its been 6 weeks since she has had a transfusion and she thinks she may need one by Monday 9/23.   POF done and phone message to [facility].  Let Lillis know to call scheduler today if she has not heard anything by 4pm.  She appreciated the change.

## 2013-04-06 ENCOUNTER — Other Ambulatory Visit (HOSPITAL_BASED_OUTPATIENT_CLINIC_OR_DEPARTMENT_OTHER): Payer: Medicare Other | Admitting: Lab

## 2013-04-06 ENCOUNTER — Ambulatory Visit (HOSPITAL_BASED_OUTPATIENT_CLINIC_OR_DEPARTMENT_OTHER): Payer: Medicare Other

## 2013-04-06 ENCOUNTER — Other Ambulatory Visit: Payer: Self-pay | Admitting: *Deleted

## 2013-04-06 ENCOUNTER — Ambulatory Visit (HOSPITAL_COMMUNITY)
Admission: RE | Admit: 2013-04-06 | Discharge: 2013-04-06 | Disposition: A | Discharge status: Home / Self Care | Payer: Medicare Other | Source: Ambulatory Visit | Attending: Oncology | Admitting: Oncology

## 2013-04-06 VITALS — BP 141/50 | HR 79 | Temp 98.4°F | Resp 20

## 2013-04-06 DIAGNOSIS — D47Z9 Other specified neoplasms of uncertain behavior of lymphoid, hematopoietic and related tissue: Secondary | ICD-10-CM

## 2013-04-06 DIAGNOSIS — D469 Myelodysplastic syndrome, unspecified: Secondary | ICD-10-CM

## 2013-04-06 DIAGNOSIS — D649 Anemia, unspecified: Secondary | ICD-10-CM

## 2013-04-06 LAB — CBC WITH DIFFERENTIAL/PLATELET
BASO%: 0.5 % (ref 0.0–2.0)
Basophils Absolute: 0.1 10*3/uL (ref 0.0–0.1)
HCT: 22.6 % — ABNORMAL LOW (ref 34.8–46.6)
HGB: 7.6 g/dL — ABNORMAL LOW (ref 11.6–15.9)
LYMPH%: 27.6 % (ref 14.0–49.7)
MCHC: 33.6 g/dL (ref 31.5–36.0)
MONO#: 0.4 10*3/uL (ref 0.1–0.9)
NEUT%: 65 % (ref 38.4–76.8)
Platelets: 389 10*3/uL (ref 145–400)
WBC: 9.7 10*3/uL (ref 3.9–10.3)

## 2013-04-06 MED ORDER — SODIUM CHLORIDE 0.9 % IV SOLN
250.0000 mL | Freq: Once | INTRAVENOUS | Status: AC
  Administered 2013-04-06: 250 mL via INTRAVENOUS

## 2013-04-06 MED ORDER — SODIUM CHLORIDE 0.9 % IJ SOLN
10.0000 mL | INTRAMUSCULAR | Status: AC | PRN
  Administered 2013-04-06: 10 mL
  Filled 2013-04-06: qty 10

## 2013-04-06 MED ORDER — LORAZEPAM 2 MG/ML IJ SOLN
1.0000 mg | Freq: Once | INTRAMUSCULAR | Status: AC
  Administered 2013-04-06: 1 mg via INTRAVENOUS

## 2013-04-06 MED ORDER — HEPARIN SOD (PORK) LOCK FLUSH 100 UNIT/ML IV SOLN
500.0000 [IU] | Freq: Every day | INTRAVENOUS | Status: AC | PRN
  Administered 2013-04-06: 500 [IU]
  Filled 2013-04-06: qty 5

## 2013-04-06 MED ORDER — LORAZEPAM 2 MG/ML IJ SOLN
INTRAMUSCULAR | Status: AC
  Filled 2013-04-06: qty 1

## 2013-04-06 MED ORDER — SODIUM CHLORIDE 0.9 % IV SOLN: 250.0000 mL | Freq: Once | INTRAVENOUS | Status: DC

## 2013-04-06 MED ORDER — HEPARIN SOD (PORK) LOCK FLUSH 100 UNIT/ML IV SOLN
500.0000 [IU] | Freq: Every day | INTRAVENOUS | Status: DC | PRN
  Filled 2013-04-06: qty 5

## 2013-04-06 MED ORDER — SODIUM CHLORIDE 0.9 % IJ SOLN
10.0000 mL | INTRAMUSCULAR | Status: DC | PRN
  Filled 2013-04-06: qty 10

## 2013-04-06 NOTE — Patient Instructions (Addendum)
Blood Transfusion Information WHAT IS A BLOOD TRANSFUSION? A transfusion is the replacement of blood or some of its parts. Blood is made up of multiple cells which provide different functions.  Red blood cells carry oxygen and are used for blood loss replacement.  White blood cells fight against infection.  Platelets control bleeding.  Plasma helps clot blood.  Other blood products are available for specialized needs, such as hemophilia or other clotting disorders. BEFORE THE TRANSFUSION  Who gives blood for transfusions?   You may be able to donate blood to be used at a later date on yourself (autologous donation).  Relatives can be asked to donate blood. This is generally not any safer than if you have received blood from a stranger. The same precautions are taken to ensure safety when a relative's blood is donated.  Healthy volunteers who are fully evaluated to make sure their blood is safe. This is blood bank blood. Transfusion therapy is the safest it has ever been in the practice of medicine. Before blood is taken from a donor, a complete history is taken to make sure that person has no history of diseases nor engages in risky social behavior (examples are intravenous drug use or sexual activity with multiple partners). The donor's travel history is screened to minimize risk of transmitting infections, such as malaria. The donated blood is tested for signs of infectious diseases, such as HIV and hepatitis. The blood is then tested to be sure it is compatible with you in order to minimize the chance of a transfusion reaction. If you or a relative donates blood, this is often done in anticipation of surgery and is not appropriate for emergency situations. It takes many days to process the donated blood. RISKS AND COMPLICATIONS Although transfusion therapy is very safe and saves many lives, the main dangers of transfusion include:   Getting an infectious disease.  Developing a  transfusion reaction. This is an allergic reaction to something in the blood you were given. Every precaution is taken to prevent this. The decision to have a blood transfusion has been considered carefully by your caregiver before blood is given. Blood is not given unless the benefits outweigh the risks. AFTER THE TRANSFUSION  Right after receiving a blood transfusion, you will usually feel much better and more energetic. This is especially true if your red blood cells have gotten low (anemic). The transfusion raises the level of the red blood cells which carry oxygen, and this usually causes an energy increase.  The nurse administering the transfusion will monitor you carefully for complications. HOME CARE INSTRUCTIONS  No special instructions are needed after a transfusion. You may find your energy is better. Speak with your caregiver about any limitations on activity for underlying diseases you may have. SEEK MEDICAL CARE IF:   Your condition is not improving after your transfusion.  You develop redness or irritation at the intravenous (IV) site. SEEK IMMEDIATE MEDICAL CARE IF:  Any of the following symptoms occur over the next 12 hours:  Shaking chills.  You have a temperature by mouth above 102 F (38.9 C), not controlled by medicine.  Chest, back, or muscle pain.  People around you feel you are not acting correctly or are confused.  Shortness of breath or difficulty breathing.  Dizziness and fainting.  You get a rash or develop hives.  You have a decrease in urine output.  Your urine turns a dark color or changes to pink, red, or brown. Any of the following   symptoms occur over the next 10 days:  You have a temperature by mouth above 102 F (38.9 C), not controlled by medicine.  Shortness of breath.  Weakness after normal activity.  The white part of the eye turns yellow (jaundice).  You have a decrease in the amount of urine or are urinating less often.  Your  urine turns a dark color or changes to pink, red, or brown. Document Released: 06/29/2000 Document Revised: 09/24/2011 Document Reviewed: 02/16/2008 Exit[employer] Patient Information 2014 [employer].  

## 2013-04-07 ENCOUNTER — Other Ambulatory Visit: Payer: Medicare Other

## 2013-04-07 LAB — TYPE AND SCREEN: Unit division: 0

## 2013-04-16 ENCOUNTER — Telehealth: Payer: Self-pay | Admitting: *Deleted

## 2013-04-16 NOTE — Telephone Encounter (Signed)
Received vm call from pt stating that she has been on the exjade x 1 week & has had no side effects & is doing well.

## 2013-04-21 ENCOUNTER — Other Ambulatory Visit (HOSPITAL_BASED_OUTPATIENT_CLINIC_OR_DEPARTMENT_OTHER): Payer: Medicare Other

## 2013-04-21 ENCOUNTER — Telehealth: Payer: Self-pay | Admitting: *Deleted

## 2013-04-21 DIAGNOSIS — D649 Anemia, unspecified: Secondary | ICD-10-CM

## 2013-04-21 DIAGNOSIS — D47Z9 Other specified neoplasms of uncertain behavior of lymphoid, hematopoietic and related tissue: Secondary | ICD-10-CM

## 2013-04-21 DIAGNOSIS — D469 Myelodysplastic syndrome, unspecified: Secondary | ICD-10-CM

## 2013-04-21 LAB — CBC WITH DIFFERENTIAL/PLATELET
BASO%: 2.1 % — ABNORMAL HIGH (ref 0.0–2.0)
Basophils Absolute: 0.2 10*3/uL — ABNORMAL HIGH (ref 0.0–0.1)
Eosinophils Absolute: 0.3 10*3/uL (ref 0.0–0.5)
HGB: 7.5 g/dL — ABNORMAL LOW (ref 11.6–15.9)
LYMPH%: 26.7 % (ref 14.0–49.7)
MCV: 96.5 fL (ref 79.5–101.0)
MONO#: 0.7 10*3/uL (ref 0.1–0.9)
NEUT#: 5.9 10*3/uL (ref 1.5–6.5)
Platelets: 436 10*3/uL — ABNORMAL HIGH (ref 145–400)
RBC: 2.4 10*6/uL — ABNORMAL LOW (ref 3.70–5.45)
RDW: 28.5 % — ABNORMAL HIGH (ref 11.2–14.5)
WBC: 9.7 10*3/uL (ref 3.9–10.3)
lymph#: 2.6 10*3/uL (ref 0.9–3.3)

## 2013-04-21 LAB — HOLD TUBE, BLOOD BANK

## 2013-04-21 NOTE — Telephone Encounter (Signed)
Spoke with pt in lobby; Hgb 7.5 pt reports "I feel fine, don't want a transfusion"  Pt instructed to call if problems.  Verbalized understanding.

## 2013-04-30 ENCOUNTER — Telehealth: Payer: Self-pay | Admitting: *Deleted

## 2013-04-30 NOTE — Telephone Encounter (Signed)
Received message from pt asking "I feel good--I have lab appt 10/21 then again 10/28 before appt with MD.  Do I need to come to both--I will do whatever the master wants"  Note to MD.  Last hgb 7.5 on 04/21/13.

## 2013-05-01 ENCOUNTER — Telehealth: Payer: Self-pay | Admitting: Oncology

## 2013-05-01 NOTE — Telephone Encounter (Signed)
talked to pt and gave her appt for lab on 05/12/13

## 2013-05-01 NOTE — Telephone Encounter (Signed)
Per Dr. Cyndie Chime; notified pt OK to skip lab appt 05/05/13 and will get labs prior to OV 10/28.  Pt verbalized understanding and expressed appreciation for call.

## 2013-05-04 ENCOUNTER — Telehealth: Payer: Self-pay | Admitting: *Deleted

## 2013-05-04 NOTE — Telephone Encounter (Signed)
[facility] called saying the patient called wondering if she should stop the exjade before the colonoscopy.  Discussed with Dr. Cyndie Chime, No, she does not need to stop it. Spoke with patient and let her know that she does not need to stop the exjade before the coloscopy.  She appreciated the phone call.

## 2013-05-05 ENCOUNTER — Other Ambulatory Visit: Payer: Medicare Other

## 2013-05-12 ENCOUNTER — Other Ambulatory Visit (HOSPITAL_BASED_OUTPATIENT_CLINIC_OR_DEPARTMENT_OTHER): Payer: Medicare Other | Admitting: Lab

## 2013-05-12 ENCOUNTER — Ambulatory Visit (HOSPITAL_BASED_OUTPATIENT_CLINIC_OR_DEPARTMENT_OTHER): Payer: Medicare Other | Admitting: Oncology

## 2013-05-12 VITALS — BP 121/53 | HR 86 | Temp 97.6°F | Resp 19 | Ht 62.0 in | Wt 168.0 lb

## 2013-05-12 DIAGNOSIS — K7689 Other specified diseases of liver: Secondary | ICD-10-CM

## 2013-05-12 DIAGNOSIS — D47Z9 Other specified neoplasms of uncertain behavior of lymphoid, hematopoietic and related tissue: Secondary | ICD-10-CM

## 2013-05-12 DIAGNOSIS — D649 Anemia, unspecified: Secondary | ICD-10-CM

## 2013-05-12 DIAGNOSIS — R945 Abnormal results of liver function studies: Secondary | ICD-10-CM

## 2013-05-12 DIAGNOSIS — D469 Myelodysplastic syndrome, unspecified: Secondary | ICD-10-CM

## 2013-05-12 LAB — CBC WITH DIFFERENTIAL/PLATELET
Basophils Absolute: 0 10*3/uL (ref 0.0–0.1)
EOS%: 2.5 % (ref 0.0–7.0)
Eosinophils Absolute: 0.3 10*3/uL (ref 0.0–0.5)
HCT: 20.9 % — ABNORMAL LOW (ref 34.8–46.6)
HGB: 6.5 g/dL — CL (ref 11.6–15.9)
MCH: 30 pg (ref 25.1–34.0)
MONO#: 0.7 10*3/uL (ref 0.1–0.9)
MONO%: 5.6 % (ref 0.0–14.0)
NEUT#: 8.2 10*3/uL — ABNORMAL HIGH (ref 1.5–6.5)
NEUT%: 66.4 % (ref 38.4–76.8)
Platelets: 315 10*3/uL (ref 145–400)
RDW: 31.1 % — ABNORMAL HIGH (ref 11.2–14.5)
WBC: 12.3 10*3/uL — ABNORMAL HIGH (ref 3.9–10.3)
lymph#: 3.1 10*3/uL (ref 0.9–3.3)

## 2013-05-13 ENCOUNTER — Ambulatory Visit (HOSPITAL_BASED_OUTPATIENT_CLINIC_OR_DEPARTMENT_OTHER): Payer: Medicare Other

## 2013-05-13 ENCOUNTER — Ambulatory Visit (HOSPITAL_COMMUNITY)
Admission: RE | Admit: 2013-05-13 | Discharge: 2013-05-13 | Disposition: A | Discharge status: Home / Self Care | Payer: Medicare Other | Source: Ambulatory Visit | Attending: Oncology | Admitting: Oncology

## 2013-05-13 ENCOUNTER — Other Ambulatory Visit: Payer: Self-pay | Admitting: *Deleted

## 2013-05-13 VITALS — BP 107/58 | HR 83 | Temp 97.7°F | Resp 20

## 2013-05-13 DIAGNOSIS — D469 Myelodysplastic syndrome, unspecified: Secondary | ICD-10-CM

## 2013-05-13 DIAGNOSIS — D649 Anemia, unspecified: Secondary | ICD-10-CM

## 2013-05-13 DIAGNOSIS — D47Z9 Other specified neoplasms of uncertain behavior of lymphoid, hematopoietic and related tissue: Secondary | ICD-10-CM | POA: Insufficient documentation

## 2013-05-13 LAB — COMPREHENSIVE METABOLIC PANEL (CC13)
ALT: 21 U/L (ref 0–55)
AST: 22 U/L (ref 5–34)
Anion Gap: 8 mEq/L (ref 3–11)
Calcium: 9.2 mg/dL (ref 8.4–10.4)
Chloride: 107 mEq/L (ref 98–109)
Creatinine: 0.8 mg/dL (ref 0.6–1.1)
Glucose: 90 mg/dl (ref 70–140)
Potassium: 4.4 mEq/L (ref 3.5–5.1)
Sodium: 140 mEq/L (ref 136–145)
Total Protein: 6.3 g/dL — ABNORMAL LOW (ref 6.4–8.3)

## 2013-05-13 LAB — FERRITIN CHCC: Ferritin: 2236 ng/ml — ABNORMAL HIGH (ref 9–269)

## 2013-05-13 LAB — LACTATE DEHYDROGENASE (CC13): LDH: 195 U/L (ref 125–245)

## 2013-05-13 MED ORDER — SODIUM CHLORIDE 0.9 % IV SOLN
250.0000 mL | Freq: Once | INTRAVENOUS | Status: AC
Start: 2013-05-13 — End: 2013-05-13
  Administered 2013-05-13: 250 mL via INTRAVENOUS

## 2013-05-13 MED ORDER — HEPARIN SOD (PORK) LOCK FLUSH 100 UNIT/ML IV SOLN
500.0000 [IU] | Freq: Every day | INTRAVENOUS | Status: AC | PRN
  Administered 2013-05-13: 500 [IU]
  Filled 2013-05-13: qty 5

## 2013-05-13 MED ORDER — SODIUM CHLORIDE 0.9 % IJ SOLN
10.0000 mL | INTRAMUSCULAR | Status: AC | PRN
  Administered 2013-05-13: 10 mL
  Filled 2013-05-13: qty 10

## 2013-05-13 MED ORDER — LORAZEPAM 2 MG/ML IJ SOLN
INTRAMUSCULAR | Status: AC
  Filled 2013-05-13: qty 1

## 2013-05-13 MED ORDER — LORAZEPAM 2 MG/ML IJ SOLN
1.0000 mg | Freq: Once | INTRAMUSCULAR | Status: DC
Start: 2013-05-13 — End: 2013-05-13
  Administered 2013-05-13: 1 mg via INTRAVENOUS

## 2013-05-13 NOTE — Patient Instructions (Signed)
Blood Transfusion Information WHAT IS A BLOOD TRANSFUSION? A transfusion is the replacement of blood or some of its parts. Blood is made up of multiple cells which provide different functions.  Red blood cells carry oxygen and are used for blood loss replacement.  White blood cells fight against infection.  Platelets control bleeding.  Plasma helps clot blood.  Other blood products are available for specialized needs, such as hemophilia or other clotting disorders. BEFORE THE TRANSFUSION  Who gives blood for transfusions?   You may be able to donate blood to be used at a later date on yourself (autologous donation).  Relatives can be asked to donate blood. This is generally not any safer than if you have received blood from a stranger. The same precautions are taken to ensure safety when a relative's blood is donated.  Healthy volunteers who are fully evaluated to make sure their blood is safe. This is blood bank blood. Transfusion therapy is the safest it has ever been in the practice of medicine. Before blood is taken from a donor, a complete history is taken to make sure that person has no history of diseases nor engages in risky social behavior (examples are intravenous drug use or sexual activity with multiple partners). The donor's travel history is screened to minimize risk of transmitting infections, such as malaria. The donated blood is tested for signs of infectious diseases, such as HIV and hepatitis. The blood is then tested to be sure it is compatible with you in order to minimize the chance of a transfusion reaction. If you or a relative donates blood, this is often done in anticipation of surgery and is not appropriate for emergency situations. It takes many days to process the donated blood. RISKS AND COMPLICATIONS Although transfusion therapy is very safe and saves many lives, the main dangers of transfusion include:   Getting an infectious disease.  Developing a  transfusion reaction. This is an allergic reaction to something in the blood you were given. Every precaution is taken to prevent this. The decision to have a blood transfusion has been considered carefully by your caregiver before blood is given. Blood is not given unless the benefits outweigh the risks. AFTER THE TRANSFUSION  Right after receiving a blood transfusion, you will usually feel much better and more energetic. This is especially true if your red blood cells have gotten low (anemic). The transfusion raises the level of the red blood cells which carry oxygen, and this usually causes an energy increase.  The nurse administering the transfusion will monitor you carefully for complications. HOME CARE INSTRUCTIONS  No special instructions are needed after a transfusion. You may find your energy is better. Speak with your caregiver about any limitations on activity for underlying diseases you may have. SEEK MEDICAL CARE IF:   Your condition is not improving after your transfusion.  You develop redness or irritation at the intravenous (IV) site. SEEK IMMEDIATE MEDICAL CARE IF:  Any of the following symptoms occur over the next 12 hours:  Shaking chills.  You have a temperature by mouth above 102 F (38.9 C), not controlled by medicine.  Chest, back, or muscle pain.  People around you feel you are not acting correctly or are confused.  Shortness of breath or difficulty breathing.  Dizziness and fainting.  You get a rash or develop hives.  You have a decrease in urine output.  Your urine turns a dark color or changes to pink, red, or brown. Any of the following   symptoms occur over the next 10 days:  You have a temperature by mouth above 102 F (38.9 C), not controlled by medicine.  Shortness of breath.  Weakness after normal activity.  The white part of the eye turns yellow (jaundice).  You have a decrease in the amount of urine or are urinating less often.  Your  urine turns a dark color or changes to pink, red, or brown. Document Released: 06/29/2000 Document Revised: 09/24/2011 Document Reviewed: 02/16/2008 [employer] Patient Information 2014 [employer].  

## 2013-05-13 NOTE — Progress Notes (Signed)
Hematology and Oncology Follow Up Visit  Carolyn Brennan [id number] 05-11-1941 72 y.o. 05/13/2013 1:07 PM   Principle Diagnosis: Encounter Diagnoses  Name Primary?  . Abnormal liver function tests Yes  . MDS/MPN (myelodysplastic/myeloproliferative neoplasms)   . MDS/MPN (myelodysplastic/myeloproliferative neoplasms)   . Anemia      Interim History:  Follow up visit for this 72 year old woman with a myeloproliferative disorder with dysplastic features. She has become transfusion-dependent for red blood cells. She is requiring blood every 3-4 weeks. She failed a trial of Aranesp and Procrit. Blood counts as far back as July 2007 showed a macrocytic anemia with hemoglobin 11 g, leukocytosis with white count 13,600, and mild elevation of her platelet count of 399,000. She was referred to our office in April 2008. Initial bone marrow done by my partner Dr. Myna Hidalgo showed a hypercellular marrow with ring sideroblasts. Routine cytogenetics were normal. FISH studies not done on the marrow. She tested negative for the JAK-2 mutation. Most recent bone marrow done 01/25/2011 shows normal cytogenetics. No evidence for the 5Q deletion on FISH. This marrow changed little compared with the initial study done at diagnosis in April of 2008. Marrow hypercellular for age 42% with prominent erythroid proliferation, megakaryocyte proliferation with the large and abnormal forms. No excess blasts. Normal maturation in the myeloid series. dysplastic changes in the erythroid series.   She has become transfusion-dependent for red cells and is getting blood about every 2-3 weeks. Ferritin levels are climbing with most recent value at almost 5000 on 11/17/2012. After much discussion and deliberation, and she agreed to a trial of Exjade. She has now been on the drug for one month. She had a baseline eye exam about a year ago and plans to have another one soon. She will need a slit lamp evaluation. She has been reading the  package insert and getting more and more upset with potential toxicities. I told her we would continue to monitor her clinical and laboratory values closely and if the drug causes any significant side effects it will be stopped. The main untoward side effect noted post marketing of this drug was renal insufficiency. Her baseline creatinine is normal. There can also be hepatotoxicity and she has chronic, liver function abnormalities which have been fluctuating over time with bilirubin values up as high as 3.1. LDH has been normal. Most recent bilirubin 2.2 on August 26.    Medications: reviewed  Allergies: No Known Allergies  Review of Systems: Hematology: negative for swollen glands, easy bruising, ENT ROS: negative for - oral lesions or sore throat Breast ROS: negative for - new or changing breast lumps Respiratory ROS: negative for - cough, pleuritic pain, shortness of breath or wheezing Cardiovascular ROS: negative for - chest pain, dyspnea on exertion, edema, irregular heartbeat, murmur, orthopnea, palpitations, paroxysmal nocturnal dyspnea or rapid heart rate Gastrointestinal ROS: negative for - abdominal pain, appetite loss, blood in stools, change in bowel habits, constipation, diarrhea, heartburn, hematemesis, melena, nausea/vomiting or swallowing difficulty/pain Genito-Urinary ROS: negative for - , dysuria, hematuria, incontinence, , or urinary frequency/urgency Musculoskeletal ROS: negative for - joint pain, joint stiffness, joint swelling, muscle pain, muscular weakness  Neurological ROS: negative for - behavioral changes, confusion, dizziness, gait disturbance, headaches, impaired coordination/balance, memory loss, numbness/tingling,  Dermatological ROS: negative for rash, ecchymosis Remaining ROS negative.  Physical Exam: Blood pressure 121/53, pulse 86, temperature 97.6 F (36.4 C), temperature source Oral, resp. rate 19, height 5\' 2"  (1.575 m), weight 168 lb (76.204 kg). Wt  Readings from Last  3 Encounters:  05/12/13 168 lb (76.204 kg)  03/10/13 166 lb 4.8 oz (75.433 kg)  12/05/12 166 lb 3.2 oz (75.388 kg)     General appearance:  HENNT: Pharynx no erythema, exudate, mass, or ulcer. No thyromegaly or thyroid nodules Lymph nodes: No cervical, supraclavicular, or axillary lymphadenopathy Breasts:  Lungs: Clear to auscultation, resonant to percussion throughout Heart: Regular rhythm, no murmur, no gallop, no rub, no click, no edema Abdomen: Soft, nontender, normal bowel sounds, spleen enlarged 5 cm and liver enlarged 5 cm below respective costal margins. Extremities: No edema, no calf tenderness Musculoskeletal: no joint deformities GU:  Vascular: Carotid pulses 2+, no bruits,  Neurologic: Alert, oriented, PERRLA, , cranial nerves grossly normal, motor strength 5 over 5, reflexes 1+ symmetric, upper body coordination normal, gait normal, Skin: No rash or ecchymosis  Lab Results: CBC W/Diff    Component Value Date/Time   WBC 12.3* 05/12/2013 1543   WBC 9.9 01/01/2013 1145   WBC 16.6* 04/16/2012 1858   RBC 2.17* 05/12/2013 1543   RBC 2.49* 01/01/2013 1145   RBC 2.80* 04/16/2012 1858   HGB 6.5* 05/12/2013 1543   HGB 7.6* 01/01/2013 1145   HGB 8.4* 04/16/2012 1858   HCT 20.9* 05/12/2013 1543   HCT 22.6* 01/01/2013 1145   HCT 27.2* 04/16/2012 1858   PLT 315 Large & giant platelets 05/12/2013 1543   PLT 497* 01/01/2013 1145   MCV 96.3 05/12/2013 1543   MCV 90.8 01/01/2013 1145   MCV 97.1* 04/16/2012 1858   MCH 30.0 05/12/2013 1543   MCH 30.5 01/01/2013 1145   MCH 30.0 04/16/2012 1858   MCHC 31.1* 05/12/2013 1543   MCHC 33.6 01/01/2013 1145   MCHC 30.9* 04/16/2012 1858   RDW 31.1* 05/12/2013 1543   RDW 22.5* 01/01/2013 1145   LYMPHSABS 3.1 05/12/2013 1543   LYMPHSABS 0.6* 11/16/2012 2215   MONOABS 0.7 05/12/2013 1543   MONOABS 0.3 11/16/2012 2215   EOSABS 0.3 05/12/2013 1543   EOSABS 0.0 11/16/2012 2215   BASOSABS 0.0 05/12/2013 1543   BASOSABS 0.0 11/16/2012 2215      Chemistry      Component Value Date/Time   NA 140 03/10/2013 1447   NA 136 11/18/2012 0356   K 4.6 03/10/2013 1447   K 4.0 11/18/2012 0356   CL 106 11/18/2012 0356   CL 103 09/01/2012 1453   CO2 26 03/10/2013 1447   CO2 22 11/18/2012 0356   BUN 13.2 03/10/2013 1447   BUN 15 11/18/2012 0356   CREATININE 0.7 03/10/2013 1447   CREATININE 0.73 11/18/2012 0356      Component Value Date/Time   CALCIUM 9.3 03/10/2013 1447   CALCIUM 8.1* 11/18/2012 0356   ALKPHOS 88 03/10/2013 1447   ALKPHOS 100 11/18/2012 0356   AST 41* 03/10/2013 1447   AST 44* 11/18/2012 0356   ALT 41 03/10/2013 1447   ALT 40* 11/18/2012 0356   BILITOT 2.16* 03/10/2013 1447   BILITOT 1.4* 11/18/2012 0356        Impression:   #1. Myeloproliferative disorder with dysplastic features.  Plan continue periodic blood monitoring and transfusions as needed.   #2. 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 We will continue to monitor ferritin levels  #3. Chronic anxiety and depression  .  #4. History of staph aureus sepsis likely related to infected Port-A-Cath infusion device 5/14  #5. 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  CC: Patient Care Team: Hollice Espy, MD as PCP - General (Family Medicine) Levert Feinstein, MD as Consulting Physician (Hematology and Oncology)   Levert Feinstein, MD 10/29/20141:07 PM

## 2013-05-14 ENCOUNTER — Telehealth: Payer: Self-pay | Admitting: Oncology

## 2013-05-14 LAB — TYPE AND SCREEN
ABO/RH(D): A POS
Unit division: 0
Unit division: 0

## 2013-05-14 NOTE — Telephone Encounter (Signed)
s.w. pt and advised on NOV thru Jan 2015 appts...pt ok and aware

## 2013-05-19 ENCOUNTER — Other Ambulatory Visit (HOSPITAL_BASED_OUTPATIENT_CLINIC_OR_DEPARTMENT_OTHER): Payer: Medicare Other

## 2013-05-21 ENCOUNTER — Other Ambulatory Visit: Payer: Self-pay

## 2013-05-26 ENCOUNTER — Other Ambulatory Visit (HOSPITAL_BASED_OUTPATIENT_CLINIC_OR_DEPARTMENT_OTHER): Payer: Medicare Other

## 2013-05-26 ENCOUNTER — Other Ambulatory Visit: Payer: Medicare Other

## 2013-05-26 DIAGNOSIS — R945 Abnormal results of liver function studies: Secondary | ICD-10-CM

## 2013-05-26 DIAGNOSIS — D649 Anemia, unspecified: Secondary | ICD-10-CM

## 2013-05-26 DIAGNOSIS — D47Z9 Other specified neoplasms of uncertain behavior of lymphoid, hematopoietic and related tissue: Secondary | ICD-10-CM

## 2013-05-26 DIAGNOSIS — D469 Myelodysplastic syndrome, unspecified: Secondary | ICD-10-CM

## 2013-05-26 LAB — COMPREHENSIVE METABOLIC PANEL (CC13)
ALT: 40 U/L (ref 0–55)
Albumin: 4.4 g/dL (ref 3.5–5.0)
Alkaline Phosphatase: 85 U/L (ref 40–150)
Anion Gap: 11 mEq/L (ref 3–11)
CO2: 23 mEq/L (ref 22–29)
Calcium: 9.5 mg/dL (ref 8.4–10.4)
Chloride: 104 mEq/L (ref 98–109)
Glucose: 110 mg/dl (ref 70–140)
Potassium: 4.3 mEq/L (ref 3.5–5.1)
Sodium: 138 mEq/L (ref 136–145)
Total Bilirubin: 2.22 mg/dL — ABNORMAL HIGH (ref 0.20–1.20)
Total Protein: 6.8 g/dL (ref 6.4–8.3)

## 2013-05-26 LAB — CBC WITH DIFFERENTIAL/PLATELET
Basophils Absolute: 0.2 10*3/uL — ABNORMAL HIGH (ref 0.0–0.1)
EOS%: 2.4 % (ref 0.0–7.0)
HGB: 8 g/dL — ABNORMAL LOW (ref 11.6–15.9)
LYMPH%: 24.9 % (ref 14.0–49.7)
MCH: 30.6 pg (ref 25.1–34.0)
MCHC: 32.7 g/dL (ref 31.5–36.0)
MCV: 93.6 fL (ref 79.5–101.0)
MONO%: 6.7 % (ref 0.0–14.0)
RBC: 2.63 10*6/uL — ABNORMAL LOW (ref 3.70–5.45)
RDW: 29.5 % — ABNORMAL HIGH (ref 11.2–14.5)
lymph#: 2.8 10*3/uL (ref 0.9–3.3)

## 2013-05-26 LAB — GAMMA GT: GGT: 138 U/L — ABNORMAL HIGH (ref 7–51)

## 2013-05-26 LAB — LACTATE DEHYDROGENASE (CC13): LDH: 209 U/L (ref 125–245)

## 2013-06-09 ENCOUNTER — Other Ambulatory Visit (HOSPITAL_BASED_OUTPATIENT_CLINIC_OR_DEPARTMENT_OTHER): Payer: Medicare Other | Admitting: Lab

## 2013-06-09 ENCOUNTER — Other Ambulatory Visit: Payer: Self-pay | Admitting: Nurse Practitioner

## 2013-06-09 ENCOUNTER — Other Ambulatory Visit: Payer: Self-pay | Admitting: *Deleted

## 2013-06-09 ENCOUNTER — Ambulatory Visit (HOSPITAL_COMMUNITY): Admit: 2013-06-09 | Discharge: 2013-06-09 | Disposition: A | Discharge status: Home / Self Care | Payer: Medicare Other

## 2013-06-09 ENCOUNTER — Ambulatory Visit (HOSPITAL_BASED_OUTPATIENT_CLINIC_OR_DEPARTMENT_OTHER): Payer: Medicare Other

## 2013-06-09 ENCOUNTER — Emergency Department (HOSPITAL_COMMUNITY)
Admission: EM | Admit: 2013-06-09 | Discharge: 2013-06-09 | Disposition: A | Discharge status: Home / Self Care | Payer: Medicare Other | Attending: Emergency Medicine | Admitting: Emergency Medicine

## 2013-06-09 ENCOUNTER — Encounter (HOSPITAL_COMMUNITY): Payer: Self-pay | Admitting: Emergency Medicine

## 2013-06-09 VITALS — BP 128/68 | HR 68 | Temp 97.2°F | Resp 20

## 2013-06-09 DIAGNOSIS — F329 Major depressive disorder, single episode, unspecified: Secondary | ICD-10-CM | POA: Insufficient documentation

## 2013-06-09 DIAGNOSIS — F3289 Other specified depressive episodes: Secondary | ICD-10-CM | POA: Insufficient documentation

## 2013-06-09 DIAGNOSIS — Z79899 Other long term (current) drug therapy: Secondary | ICD-10-CM | POA: Insufficient documentation

## 2013-06-09 DIAGNOSIS — R040 Epistaxis: Secondary | ICD-10-CM

## 2013-06-09 DIAGNOSIS — Z862 Personal history of diseases of the blood and blood-forming organs and certain disorders involving the immune mechanism: Secondary | ICD-10-CM | POA: Insufficient documentation

## 2013-06-09 DIAGNOSIS — R945 Abnormal results of liver function studies: Secondary | ICD-10-CM

## 2013-06-09 DIAGNOSIS — D649 Anemia, unspecified: Secondary | ICD-10-CM

## 2013-06-09 DIAGNOSIS — Z87891 Personal history of nicotine dependence: Secondary | ICD-10-CM | POA: Insufficient documentation

## 2013-06-09 DIAGNOSIS — R7881 Bacteremia: Secondary | ICD-10-CM

## 2013-06-09 DIAGNOSIS — D47Z9 Other specified neoplasms of uncertain behavior of lymphoid, hematopoietic and related tissue: Secondary | ICD-10-CM

## 2013-06-09 DIAGNOSIS — Z7982 Long term (current) use of aspirin: Secondary | ICD-10-CM | POA: Insufficient documentation

## 2013-06-09 DIAGNOSIS — D469 Myelodysplastic syndrome, unspecified: Secondary | ICD-10-CM

## 2013-06-09 DIAGNOSIS — F411 Generalized anxiety disorder: Secondary | ICD-10-CM | POA: Insufficient documentation

## 2013-06-09 DIAGNOSIS — R7989 Other specified abnormal findings of blood chemistry: Secondary | ICD-10-CM

## 2013-06-09 LAB — COMPREHENSIVE METABOLIC PANEL (CC13)
AST: 23 U/L (ref 5–34)
Alkaline Phosphatase: 65 U/L (ref 40–150)
BUN: 14.4 mg/dL (ref 7.0–26.0)
Calcium: 8.8 mg/dL (ref 8.4–10.4)
Creatinine: 0.7 mg/dL (ref 0.6–1.1)
Glucose: 92 mg/dl (ref 70–140)
Total Bilirubin: 2.75 mg/dL — ABNORMAL HIGH (ref 0.20–1.20)

## 2013-06-09 LAB — CBC WITH DIFFERENTIAL/PLATELET
Basophils Absolute: 0.2 10*3/uL — ABNORMAL HIGH (ref 0.0–0.1)
EOS%: 2.6 % (ref 0.0–7.0)
Eosinophils Absolute: 0.3 10*3/uL (ref 0.0–0.5)
HCT: 21.2 % — ABNORMAL LOW (ref 34.8–46.6)
HGB: 6.7 g/dL — CL (ref 11.6–15.9)
MCH: 30.5 pg (ref 25.1–34.0)
MCV: 96.5 fL (ref 79.5–101.0)
MONO%: 5.9 % (ref 0.0–14.0)
NEUT#: 6.2 10*3/uL (ref 1.5–6.5)
NEUT%: 64.7 % (ref 38.4–76.8)
Platelets: 414 10*3/uL — ABNORMAL HIGH (ref 145–400)
WBC: 9.6 10*3/uL (ref 3.9–10.3)

## 2013-06-09 LAB — LACTATE DEHYDROGENASE (CC13): LDH: 182 U/L (ref 125–245)

## 2013-06-09 LAB — HOLD TUBE, BLOOD BANK

## 2013-06-09 LAB — TECHNOLOGIST REVIEW

## 2013-06-09 MED ORDER — SODIUM CHLORIDE 0.9 % IJ SOLN
10.0000 mL | INTRAMUSCULAR | Status: AC | PRN
  Administered 2013-06-09: 10 mL
  Filled 2013-06-09: qty 10

## 2013-06-09 MED ORDER — LORAZEPAM 2 MG/ML IJ SOLN
1.0000 mg | Freq: Once | INTRAMUSCULAR | Status: DC
  Administered 2013-06-09: 1 mg via INTRAVENOUS

## 2013-06-09 MED ORDER — HEPARIN SOD (PORK) LOCK FLUSH 100 UNIT/ML IV SOLN
500.0000 [IU] | Freq: Every day | INTRAVENOUS | Status: AC | PRN
  Administered 2013-06-09: 500 [IU]
  Filled 2013-06-09: qty 5

## 2013-06-09 MED ORDER — AMOXICILLIN 500 MG PO CAPS: 500.0000 mg | ORAL_CAPSULE | Freq: Three times a day (TID) | ORAL | Status: DC

## 2013-06-09 MED ORDER — LORAZEPAM 2 MG/ML IJ SOLN
INTRAMUSCULAR | Status: AC
  Filled 2013-06-09: qty 1

## 2013-06-09 MED ORDER — SODIUM CHLORIDE 0.9 % IV SOLN
250.0000 mL | Freq: Once | INTRAVENOUS | Status: AC
  Administered 2013-06-09: 250 mL via INTRAVENOUS

## 2013-06-09 MED ORDER — SILVER NITRATE-POT NITRATE 75-25 % EX MISC
1.0000 "application " | Freq: Once | CUTANEOUS | Status: AC
  Administered 2013-06-09: 1 via TOPICAL
  Filled 2013-06-09: qty 1

## 2013-06-09 NOTE — ED Provider Notes (Signed)
CSN: [id number]     Arrival date & time 06/09/13  9562 History   First MD Initiated Contact with Patient 06/09/13 [phone]     Chief Complaint  Patient presents with  . Epistaxis   (Consider location/radiation/quality/duration/timing/severity/associated sxs/prior Treatment) HPI Comments: Patient presents to the ER for evaluation of nosebleed. Patient reports that she has been having intermittent bleeding from the fossa of her nose for the last 12 hours. She does have problems with nosebleeds in the past, but has never been on for this long period patient presents to the cancer Center today for scheduled transfusion secondary to myelodysplastic disease. She was sent to the ER to have a nosebleed just before she can get transfused. Patient denies trauma. There is no pain. Did not have any dizziness, shortness of breath.   Past Medical History  Diagnosis Date  . 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 Anxiety and depression 08/12/2011  . Anemia    Past Surgical History  Procedure Laterality Date  . Neck plastic surgery    . Abdominoplasty/panniculectomy    . Tubal ligation    . Breast reduction surgery    . Appendectomy    . Tee without cardioversion N/A 11/19/2012    Procedure: TRANSESOPHAGEAL ECHOCARDIOGRAM (TEE);  Surgeon: Corky Crafts, MD;  Location: [facility] ENDOSCOPY;  Service: Cardiovascular;  Laterality: N/A;   Family History  Problem Relation Age of Onset  . Breast cancer Mother   . Colon cancer Father 37   History  Substance Use Topics  . Smoking status: Former Smoker    Start date: 04/16/1992  . Smokeless tobacco: Not on file  . Alcohol Use: No   OB History   Grav Para Term Preterm Abortions TAB SAB Ect Mult Living   3 0        0     Review of Systems  HENT: Positive for nosebleeds.   All other systems reviewed and are negative.    Allergies  Review of patient's allergies indicates no known allergies.  Home Medications   Current  Outpatient Rx  Name  Route  Sig  Dispense  Refill  . amoxicillin (AMOXIL) 500 MG tablet      Take 1 tablet two hours before dental procedure---- then four times a day for 48 hours.   10 tablet   1     Please keep this script on hold for pt.  She will  ...   . aspirin EC 81 MG tablet   Oral   Take 81 mg by mouth daily.         [name] deferasirox (EXJADE) 500 MG disintegrating tablet      Three tabs orally every am on an empty stomach   90 tablet   prn     This script was faxed to [facility] @ [phone] ...   . diphenhydrAMINE (BENADRYL) 25 MG tablet   Oral   Take 25 mg by mouth at bedtime as needed for sleep.          . ergocalciferol (VITAMIN D2) 50000 UNITS capsule   Oral   Take 50,000 Units by mouth 2 (two) times a week. On mondays and fridays         . FLUoxetine (PROZAC) 40 MG capsule   Oral   Take 40 mg by mouth every evening.          [name] HYDROcodone-acetaminophen (NORCO/VICODIN) 5-325 MG per tablet               .  lidocaine-prilocaine (EMLA) cream   Topical   Apply topically as needed. Apply over port area 1-2 hours before treatment and cover with plastic wrap   30 g   prn    BP 132/49  Pulse 82  Temp(Src) 97.5 F (36.4 C) (Oral)  Resp 20  SpO2 97% Physical Exam  Constitutional: She is oriented to person, place, and time. She appears well-developed and well-nourished. No distress.  HENT:  Head: Normocephalic and atraumatic.  Right Ear: Hearing normal.  Left Ear: Hearing normal.  Nose: Nose normal.    Mouth/Throat: Oropharynx is clear and moist and mucous membranes are normal.  Eyes: Conjunctivae and EOM are normal. Pupils are equal, round, and reactive to light.  Neck: Normal range of motion. Neck supple.  Cardiovascular: Regular rhythm, S1 normal and S2 normal.  Exam reveals no gallop and no friction rub.   No murmur heard. Pulmonary/Chest: Effort normal and breath sounds normal. No respiratory distress. She exhibits no tenderness.    Abdominal: Soft. Normal appearance and bowel sounds are normal. There is no hepatosplenomegaly. There is no tenderness. There is no rebound, no guarding, no tenderness at McBurney's point and negative Murphy's sign. No hernia.  Musculoskeletal: Normal range of motion.  Neurological: She is alert and oriented to person, place, and time. She has normal strength. No cranial nerve deficit or sensory deficit. Coordination normal. GCS eye subscore is 4. GCS verbal subscore is 5. GCS motor subscore is 6.  Skin: Skin is warm, dry and intact. No rash noted. No cyanosis.  Psychiatric: She has a normal mood and affect. Her speech is normal and behavior is normal. Thought content normal.    ED Course  Procedures (including critical care time)  Procedure: Nasal cauterization An area of bleeding was identified on the left anterior nasal septum. Topical anesthesia with 4% lidocaine was utilized. The bleeding area was cauterized with silver nitrate. Each time the bleeding area was cauterized, an area that was more proximal it start to bleed. Several areas were cauterized but the bleeding could not be stopped.  Procedure: Nasal packing Rapid rhino 4.5 cm balloon was lubricated with water and then gently inserted into the left narrow. Approximately 2.5 cm of air was then insufflated into the balloon and the patient tolerated this well. No crepitations.  Labs Review Labs Reviewed - No data to display Imaging Review No results found.  EKG Interpretation   None       MDM  Diagnosis: Epistaxis  Presents to the ER for evaluation of nosebleed. An attempt was made to cauterize the area, but she had multiple lucent areas and this was unsuccessful, as outlined above. Nasal packing was therefore placed. Kriste Basque should be removed in 2-3 days. Patient is seen by Doctor Suszanne Conners, of ENT. She called his office from the ER and was told that [place] was [place]. Because of the holiday, it is unlikely that she will be  able to be seen in ENT office for removal. The patient will therefore be brought back to the ER for removal of the balloon. Patient is to return in 2 or 3 days. Return to the ER sooner if she has uncontrolled bleeding.  Gilda Crease, MD 06/09/13 1113

## 2013-06-09 NOTE — ED Notes (Signed)
Pt comes from cancer center c/o nose bleed that started 12 hours ago. Pt states she gets these often. Pt was at cancer center to get a transfusion and they didn't want to start the transfusion until the nose bleed was evaluated and solved. Pt is to go back to Cancer center once she is finished in ED.

## 2013-06-09 NOTE — ED Notes (Signed)
Pt escorted to discharge window. Pt verbalized understanding discharge instructions. In no acute distress.  

## 2013-06-09 NOTE — ED Notes (Signed)
Pt alert and oriented x4. Respirations even and unlabored, bilateral symmetrical rise and fall of chest. Skin warm and dry. In no acute distress. Denies needs.   

## 2013-06-09 NOTE — Patient Instructions (Signed)
Blood Transfusion Information WHAT IS A BLOOD TRANSFUSION? A transfusion is the replacement of blood or some of its parts. Blood is made up of multiple cells which provide different functions.  Red blood cells carry oxygen and are used for blood loss replacement.  White blood cells fight against infection.  Platelets control bleeding.  Plasma helps clot blood.  Other blood products are available for specialized needs, such as hemophilia or other clotting disorders. BEFORE THE TRANSFUSION  Who gives blood for transfusions?   You may be able to donate blood to be used at a later date on yourself (autologous donation).  Relatives can be asked to donate blood. This is generally not any safer than if you have received blood from a stranger. The same precautions are taken to ensure safety when a relative's blood is donated.  Healthy volunteers who are fully evaluated to make sure their blood is safe. This is blood bank blood. Transfusion therapy is the safest it has ever been in the practice of medicine. Before blood is taken from a donor, a complete history is taken to make sure that person has no history of diseases nor engages in risky social behavior (examples are intravenous drug use or sexual activity with multiple partners). The donor's travel history is screened to minimize risk of transmitting infections, such as malaria. The donated blood is tested for signs of infectious diseases, such as HIV and hepatitis. The blood is then tested to be sure it is compatible with you in order to minimize the chance of a transfusion reaction. If you or a relative donates blood, this is often done in anticipation of surgery and is not appropriate for emergency situations. It takes many days to process the donated blood. RISKS AND COMPLICATIONS Although transfusion therapy is very safe and saves many lives, the main dangers of transfusion include:   Getting an infectious disease.  Developing a  transfusion reaction. This is an allergic reaction to something in the blood you were given. Every precaution is taken to prevent this. The decision to have a blood transfusion has been considered carefully by your caregiver before blood is given. Blood is not given unless the benefits outweigh the risks. AFTER THE TRANSFUSION  Right after receiving a blood transfusion, you will usually feel much better and more energetic. This is especially true if your red blood cells have gotten low (anemic). The transfusion raises the level of the red blood cells which carry oxygen, and this usually causes an energy increase.  The nurse administering the transfusion will monitor you carefully for complications. HOME CARE INSTRUCTIONS  No special instructions are needed after a transfusion. You may find your energy is better. Speak with your caregiver about any limitations on activity for underlying diseases you may have. SEEK MEDICAL CARE IF:   Your condition is not improving after your transfusion.  You develop redness or irritation at the intravenous (IV) site. SEEK IMMEDIATE MEDICAL CARE IF:  Any of the following symptoms occur over the next 12 hours:  Shaking chills.  You have a temperature by mouth above 102 F (38.9 C), not controlled by medicine.  Chest, back, or muscle pain.  People around you feel you are not acting correctly or are confused.  Shortness of breath or difficulty breathing.  Dizziness and fainting.  You get a rash or develop hives.  You have a decrease in urine output.  Your urine turns a dark color or changes to pink, red, or brown. Any of the following   symptoms occur over the next 10 days:  You have a temperature by mouth above 102 F (38.9 C), not controlled by medicine.  Shortness of breath.  Weakness after normal activity.  The white part of the eye turns yellow (jaundice).  You have a decrease in the amount of urine or are urinating less often.  Your  urine turns a dark color or changes to pink, red, or brown. Document Released: 06/29/2000 Document Revised: 09/24/2011 Document Reviewed: 02/16/2008 [employer] Patient Information 2014 [employer].  

## 2013-06-09 NOTE — Progress Notes (Signed)
Patient discharged with nasal packing from ED in place. Patient c/o the packing is irritating and she feels "clogged up." she often has to be reminded to mouth breath to feel relief. She states "i will probably have to come back (to ED) tomorrow" but does not want me to call ED at this time bc she is ready to go home, she has been here since the AM. VSS, patient AAO, no other complaints. Her cousin is here to transport her home.

## 2013-06-09 NOTE — ED Notes (Signed)
Pt nose still bleeding, md will place rapid rhino.

## 2013-06-10 ENCOUNTER — Telehealth: Payer: Self-pay | Admitting: *Deleted

## 2013-06-10 LAB — TYPE AND SCREEN
ABO/RH(D): A POS
Unit division: 0

## 2013-06-10 NOTE — Telephone Encounter (Signed)
Received vm call from pt thanking everyone for helping her yest & for the quick response.  She reports that she will be back fri to get bubble out of her nose.  She wants reports of lab work done [employer] & states that she really is starting to hate this drug & if it isn't doing any good, she would like to stop it.  Returned call to pt & gave lab reports.  She is thinking of calling [facility] to tell them of her nose bleed b/c she read that exjade could cause bleeding.  Suggested that we do not know if this is from the exjade or from dry cold weather or if she just has some fragile exposed vessels in her nose.  She agreed but states she is looking for a good excuse to stop this drug.  She reports that the ED doc suggested that she needed to see Dr Cyndie Chime before scheduled visit in Jan.  Note to Dr Cyndie Chime.

## 2013-06-12 ENCOUNTER — Emergency Department (HOSPITAL_COMMUNITY)
Admission: EM | Admit: 2013-06-12 | Discharge: 2013-06-12 | Disposition: A | Discharge status: Home / Self Care | Payer: Medicare Other | Attending: Emergency Medicine | Admitting: Emergency Medicine

## 2013-06-12 ENCOUNTER — Encounter (HOSPITAL_COMMUNITY): Payer: Self-pay | Admitting: Emergency Medicine

## 2013-06-12 DIAGNOSIS — R079 Chest pain, unspecified: Secondary | ICD-10-CM | POA: Insufficient documentation

## 2013-06-12 DIAGNOSIS — R6889 Other general symptoms and signs: Secondary | ICD-10-CM | POA: Insufficient documentation

## 2013-06-12 DIAGNOSIS — Z87898 Personal history of other specified conditions: Secondary | ICD-10-CM | POA: Insufficient documentation

## 2013-06-12 DIAGNOSIS — Z4800 Encounter for change or removal of nonsurgical wound dressing: Secondary | ICD-10-CM | POA: Insufficient documentation

## 2013-06-12 DIAGNOSIS — F329 Major depressive disorder, single episode, unspecified: Secondary | ICD-10-CM | POA: Insufficient documentation

## 2013-06-12 DIAGNOSIS — Z792 Long term (current) use of antibiotics: Secondary | ICD-10-CM | POA: Insufficient documentation

## 2013-06-12 DIAGNOSIS — Z862 Personal history of diseases of the blood and blood-forming organs and certain disorders involving the immune mechanism: Secondary | ICD-10-CM | POA: Insufficient documentation

## 2013-06-12 DIAGNOSIS — F411 Generalized anxiety disorder: Secondary | ICD-10-CM | POA: Insufficient documentation

## 2013-06-12 DIAGNOSIS — Z87891 Personal history of nicotine dependence: Secondary | ICD-10-CM | POA: Insufficient documentation

## 2013-06-12 DIAGNOSIS — Z79899 Other long term (current) drug therapy: Secondary | ICD-10-CM | POA: Insufficient documentation

## 2013-06-12 DIAGNOSIS — F3289 Other specified depressive episodes: Secondary | ICD-10-CM | POA: Insufficient documentation

## 2013-06-12 DIAGNOSIS — Z7982 Long term (current) use of aspirin: Secondary | ICD-10-CM | POA: Insufficient documentation

## 2013-06-12 DIAGNOSIS — Z5189 Encounter for other specified aftercare: Secondary | ICD-10-CM

## 2013-06-12 NOTE — ED Notes (Signed)
Pt seen here 3 days ago for nasal packing. Was told to return for removal of nasal packing, but packing fell out on its own this am. Pt reports no bleeding, no pain.

## 2013-06-12 NOTE — ED Provider Notes (Signed)
CSN: [id number]     Arrival date & time 06/12/13  0900 History   None    Chief Complaint  Patient presents with  . Wound Check   (Consider location/radiation/quality/duration/timing/severity/associated sxs/prior Treatment) Patient is a 72 y.o. female presenting with wound check.  Wound Check Associated symptoms include chest pain. Pertinent negatives include no congestion, coughing or sore throat.    Patient presents to ED because she sneezed and expelled her nose packing that was placed on 06/09/13. She has had not bleeding, no pain since removal. She is concerned about the discolored appearance of her left nare. She denies any worsening symptoms. She plans to f/u with ENT next week.    Past Medical History  Diagnosis Date  . 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 Anxiety and depression 08/12/2011  . Anemia    Past Surgical History  Procedure Laterality Date  . Neck plastic surgery    . Abdominoplasty/panniculectomy    . Tubal ligation    . Breast reduction surgery    . Appendectomy    . Tee without cardioversion N/A 11/19/2012    Procedure: TRANSESOPHAGEAL ECHOCARDIOGRAM (TEE);  Surgeon: Corky Crafts, MD;  Location: [facility] ENDOSCOPY;  Service: Cardiovascular;  Laterality: N/A;   Family History  Problem Relation Age of Onset  . Breast cancer Mother   . Colon cancer Father 27   History  Substance Use Topics  . Smoking status: Former Smoker    Start date: 04/16/1992  . Smokeless tobacco: Not on file  . Alcohol Use: No   OB History   Grav Para Term Preterm Abortions TAB SAB Ect Mult Living   3 0        0     Review of Systems  HENT: Positive for sneezing. Negative for congestion, nosebleeds, postnasal drip, rhinorrhea, sinus pressure and sore throat.   Respiratory: Negative for cough, choking and shortness of breath.   Cardiovascular: Positive for chest pain. Negative for leg swelling.    Allergies  Review of patient's allergies  indicates no known allergies.  Home Medications   Current Outpatient Rx  Name  Route  Sig  Dispense  Refill  . amoxicillin (AMOXIL) 500 MG capsule   Oral   Take 1 capsule (500 mg total) by mouth 3 (three) times daily.   15 capsule   0   . aspirin EC 81 MG tablet   Oral   Take 81 mg by mouth daily.         [name] deferasirox (EXJADE) 500 MG disintegrating tablet      Three tabs orally every am on an empty stomach   90 tablet   prn     This script was faxed to [facility] @ [phone] ...   . diphenhydrAMINE (BENADRYL) 25 MG tablet   Oral   Take 25 mg by mouth at bedtime as needed for sleep.          . ergocalciferol (VITAMIN D2) 50000 UNITS capsule   Oral   Take 50,000 Units by mouth 2 (two) times a week. On mondays and fridays         . FLUoxetine (PROZAC) 40 MG capsule   Oral   Take 40 mg by mouth every evening.          [name] HYDROcodone-acetaminophen (NORCO/VICODIN) 5-325 MG per tablet               . lidocaine-prilocaine (EMLA) cream   Topical   Apply topically as needed. Apply over port area  1-2 hours before treatment and cover with plastic wrap   30 g   prn    BP 133/54  Pulse 79  Temp(Src) 98.2 F (36.8 C) (Oral)  Resp 16  SpO2 97% Physical Exam  Constitutional: She appears well-developed and well-nourished. No distress.  HENT:  Head: Normocephalic.  Mouth/Throat: Oropharynx is clear and moist. No oropharyngeal exudate.  A number of cauterized lesions in the left nare. No evidence of acute bleed.   Cardiovascular: Normal rate, regular rhythm, normal heart sounds and intact distal pulses.  Exam reveals no gallop and no friction rub.   No murmur heard. Pulmonary/Chest: Effort normal and breath sounds normal. No respiratory distress. She has no wheezes. She has no rales.  Skin: She is not diaphoretic.    ED Course  Procedures (including critical care time) Labs Review Labs Reviewed - No data to display Imaging Review No results found.  EKG  Interpretation   None       MDM   1. Visit for wound check     1. Wound Check The patients nasal wound appears to be healing well. The nose packing was dislodge after sneezing this morning. She has had no sequella at this time. I instructed the patient to stay off ASA at this time. I recommended that the patient keep her appointment next week with ENT. The patient is in agreement with this plan.    Pleas Koch, MD 06/12/13 [phone]

## 2013-06-12 NOTE — ED Notes (Signed)
Gave pt washcloth with hydrogen peroxide.Pt soaking outside nose and gently rubbing.

## 2013-06-12 NOTE — ED Provider Notes (Signed)
I saw and evaluated the patient, reviewed the resident's note and I agree with the findings and plan.  EKG Interpretation   None      Good eschar left naris mucosa after packing removed PTA.  Hurman Horn, MD 06/12/13 2008

## 2013-06-16 ENCOUNTER — Telehealth: Payer: Self-pay | Admitting: *Deleted

## 2013-06-16 ENCOUNTER — Other Ambulatory Visit: Payer: Self-pay | Admitting: *Deleted

## 2013-06-16 MED ORDER — LORAZEPAM 0.5 MG PO TABS: 0.5000 mg | ORAL_TABLET | Freq: Four times a day (QID) | ORAL | Status: DC | PRN

## 2013-06-16 NOTE — Telephone Encounter (Signed)
Received vm call from pt stating that she has some ativan that was prescribed 1 1/2 yrs ago dated 11/03/11 & she hadn't taken until last week.  She took 2 ("since they were so small") instead of her sleepeez b/c of her nose bleed & the fact that they really dry her out.  She states she slept.  She wants to know if Dr. Cyndie Chime would prescribe this for sleep.  She reports that she has been taking 2 tabs = 1mg  bid.  She still has some left. Original script was for 0.5 mg q 8 hours for anxiety.  Note to Dr Cyndie Chime.

## 2013-06-23 ENCOUNTER — Other Ambulatory Visit (HOSPITAL_BASED_OUTPATIENT_CLINIC_OR_DEPARTMENT_OTHER): Payer: Medicare Other

## 2013-06-23 DIAGNOSIS — D469 Myelodysplastic syndrome, unspecified: Secondary | ICD-10-CM

## 2013-06-23 DIAGNOSIS — D649 Anemia, unspecified: Secondary | ICD-10-CM

## 2013-06-23 DIAGNOSIS — R945 Abnormal results of liver function studies: Secondary | ICD-10-CM

## 2013-06-23 DIAGNOSIS — D47Z9 Other specified neoplasms of uncertain behavior of lymphoid, hematopoietic and related tissue: Secondary | ICD-10-CM

## 2013-06-23 LAB — COMPREHENSIVE METABOLIC PANEL (CC13)
ALT: 40 U/L (ref 0–55)
AST: 34 U/L (ref 5–34)
Alkaline Phosphatase: 78 U/L (ref 40–150)
Anion Gap: 12 mEq/L — ABNORMAL HIGH (ref 3–11)
CO2: 22 mEq/L (ref 22–29)
Calcium: 9.4 mg/dL (ref 8.4–10.4)
Chloride: 106 mEq/L (ref 98–109)
Sodium: 140 mEq/L (ref 136–145)
Total Protein: 6.5 g/dL (ref 6.4–8.3)

## 2013-06-23 LAB — CBC WITH DIFFERENTIAL/PLATELET
BASO%: 2.4 % — ABNORMAL HIGH (ref 0.0–2.0)
MCHC: 32.6 g/dL (ref 31.5–36.0)
MONO#: 0.5 10*3/uL (ref 0.1–0.9)
RBC: 2.77 10*6/uL — ABNORMAL LOW (ref 3.70–5.45)
RDW: 26.6 % — ABNORMAL HIGH (ref 11.2–14.5)
WBC: 9.9 10*3/uL (ref 3.9–10.3)
lymph#: 2.9 10*3/uL (ref 0.9–3.3)

## 2013-06-23 LAB — HOLD TUBE, BLOOD BANK

## 2013-07-01 ENCOUNTER — Telehealth: Payer: Self-pay | Admitting: Oncology

## 2013-07-01 NOTE — Telephone Encounter (Signed)
Called pt left message regarding lab on 12/23, per pt request, early am

## 2013-07-07 ENCOUNTER — Ambulatory Visit (HOSPITAL_COMMUNITY)
Admission: RE | Admit: 2013-07-07 | Discharge: 2013-07-07 | Disposition: A | Discharge status: Home / Self Care | Payer: Medicare Other | Source: Ambulatory Visit | Attending: Oncology | Admitting: Oncology

## 2013-07-07 ENCOUNTER — Other Ambulatory Visit: Payer: Medicare Other

## 2013-07-07 ENCOUNTER — Other Ambulatory Visit: Payer: Self-pay | Admitting: *Deleted

## 2013-07-07 ENCOUNTER — Ambulatory Visit (HOSPITAL_BASED_OUTPATIENT_CLINIC_OR_DEPARTMENT_OTHER): Payer: Medicare Other

## 2013-07-07 ENCOUNTER — Other Ambulatory Visit (HOSPITAL_BASED_OUTPATIENT_CLINIC_OR_DEPARTMENT_OTHER): Payer: Medicare Other

## 2013-07-07 ENCOUNTER — Telehealth: Payer: Self-pay | Admitting: *Deleted

## 2013-07-07 VITALS — BP 125/74 | HR 74 | Temp 97.9°F | Resp 18

## 2013-07-07 DIAGNOSIS — D649 Anemia, unspecified: Secondary | ICD-10-CM

## 2013-07-07 DIAGNOSIS — D469 Myelodysplastic syndrome, unspecified: Secondary | ICD-10-CM

## 2013-07-07 LAB — CBC WITH DIFFERENTIAL/PLATELET
BASO%: 2.4 % — ABNORMAL HIGH (ref 0.0–2.0)
Basophils Absolute: 0.2 10*3/uL — ABNORMAL HIGH (ref 0.0–0.1)
EOS%: 2.6 % (ref 0.0–7.0)
Eosinophils Absolute: 0.3 10*3/uL (ref 0.0–0.5)
HCT: 23.6 % — ABNORMAL LOW (ref 34.8–46.6)
HGB: 7.9 g/dL — ABNORMAL LOW (ref 11.6–15.9)
LYMPH%: 20.9 % (ref 14.0–49.7)
MCH: 32.3 pg (ref 25.1–34.0)
MCHC: 33.5 g/dL (ref 31.5–36.0)
MCV: 96.3 fL (ref 79.5–101.0)
MONO#: 0.6 10*3/uL (ref 0.1–0.9)
MONO%: 5.7 % (ref 0.0–14.0)
NEUT#: 6.8 10*3/uL — ABNORMAL HIGH (ref 1.5–6.5)
NEUT%: 68.4 % (ref 38.4–76.8)
Platelets: 472 10*3/uL — ABNORMAL HIGH (ref 145–400)
RBC: 2.45 10*6/uL — ABNORMAL LOW (ref 3.70–5.45)
RDW: 29 % — ABNORMAL HIGH (ref 11.2–14.5)
WBC: 10 10*3/uL (ref 3.9–10.3)
lymph#: 2.1 10*3/uL (ref 0.9–3.3)

## 2013-07-07 LAB — HOLD TUBE, BLOOD BANK

## 2013-07-07 MED ORDER — LORAZEPAM 2 MG/ML IJ SOLN
1.0000 mg | Freq: Once | INTRAMUSCULAR | Status: AC
  Administered 2013-07-07: 1 mg via INTRAVENOUS

## 2013-07-07 MED ORDER — SODIUM CHLORIDE 0.9 % IJ SOLN
10.0000 mL | INTRAMUSCULAR | Status: AC | PRN
  Administered 2013-07-07: 10 mL
  Filled 2013-07-07: qty 10

## 2013-07-07 MED ORDER — SODIUM CHLORIDE 0.9 % IV SOLN
250.0000 mL | Freq: Once | INTRAVENOUS | Status: AC
  Administered 2013-07-07: 250 mL via INTRAVENOUS

## 2013-07-07 MED ORDER — LORAZEPAM 2 MG/ML IJ SOLN
INTRAMUSCULAR | Status: AC
  Filled 2013-07-07: qty 1

## 2013-07-07 MED ORDER — HEPARIN SOD (PORK) LOCK FLUSH 100 UNIT/ML IV SOLN
500.0000 [IU] | Freq: Every day | INTRAVENOUS | Status: AC | PRN
  Administered 2013-07-07: 500 [IU]
  Filled 2013-07-07: qty 5

## 2013-07-07 NOTE — Telephone Encounter (Signed)
Pt feeling tired & request blood transfusion today.  Verified with Lonna Cobb NP & called for [facility] with [facility] Admitting.

## 2013-07-07 NOTE — Patient Instructions (Signed)
Blood Transfusion Information WHAT IS A BLOOD TRANSFUSION? A transfusion is the replacement of blood or some of its parts. Blood is made up of multiple cells which provide different functions.  Red blood cells carry oxygen and are used for blood loss replacement.  White blood cells fight against infection.  Platelets control bleeding.  Plasma helps clot blood.  Other blood products are available for specialized needs, such as hemophilia or other clotting disorders. BEFORE THE TRANSFUSION  Who gives blood for transfusions?   You may be able to donate blood to be used at a later date on yourself (autologous donation).  Relatives can be asked to donate blood. This is generally not any safer than if you have received blood from a stranger. The same precautions are taken to ensure safety when a relative's blood is donated.  Healthy volunteers who are fully evaluated to make sure their blood is safe. This is blood bank blood. Transfusion therapy is the safest it has ever been in the practice of medicine. Before blood is taken from a donor, a complete history is taken to make sure that person has no history of diseases nor engages in risky social behavior (examples are intravenous drug use or sexual activity with multiple partners). The donor's travel history is screened to minimize risk of transmitting infections, such as malaria. The donated blood is tested for signs of infectious diseases, such as HIV and hepatitis. The blood is then tested to be sure it is compatible with you in order to minimize the chance of a transfusion reaction. If you or a relative donates blood, this is often done in anticipation of surgery and is not appropriate for emergency situations. It takes many days to process the donated blood. RISKS AND COMPLICATIONS Although transfusion therapy is very safe and saves many lives, the main dangers of transfusion include:   Getting an infectious disease.  Developing a  transfusion reaction. This is an allergic reaction to something in the blood you were given. Every precaution is taken to prevent this. The decision to have a blood transfusion has been considered carefully by your caregiver before blood is given. Blood is not given unless the benefits outweigh the risks. AFTER THE TRANSFUSION  Right after receiving a blood transfusion, you will usually feel much better and more energetic. This is especially true if your red blood cells have gotten low (anemic). The transfusion raises the level of the red blood cells which carry oxygen, and this usually causes an energy increase.  The nurse administering the transfusion will monitor you carefully for complications. HOME CARE INSTRUCTIONS  No special instructions are needed after a transfusion. You may find your energy is better. Speak with your caregiver about any limitations on activity for underlying diseases you may have. SEEK MEDICAL CARE IF:   Your condition is not improving after your transfusion.  You develop redness or irritation at the intravenous (IV) site. SEEK IMMEDIATE MEDICAL CARE IF:  Any of the following symptoms occur over the next 12 hours:  Shaking chills.  You have a temperature by mouth above 102 F (38.9 C), not controlled by medicine.  Chest, back, or muscle pain.  People around you feel you are not acting correctly or are confused.  Shortness of breath or difficulty breathing.  Dizziness and fainting.  You get a rash or develop hives.  You have a decrease in urine output.  Your urine turns a dark color or changes to pink, red, or brown. Any of the following   symptoms occur over the next 10 days:  You have a temperature by mouth above 102 F (38.9 C), not controlled by medicine.  Shortness of breath.  Weakness after normal activity.  The white part of the eye turns yellow (jaundice).  You have a decrease in the amount of urine or are urinating less often.  Your  urine turns a dark color or changes to pink, red, or brown. Document Released: 06/29/2000 Document Revised: 09/24/2011 Document Reviewed: 02/16/2008 [employer] Patient Information 2014 [employer].  

## 2013-07-07 NOTE — Progress Notes (Signed)
Pt discharged to home, recommended she contact someone to drive her home, recommended a ride for pt. She declined. Stated she feels fine/ not drowsy.

## 2013-07-08 LAB — TYPE AND SCREEN
ABO/RH(D): A POS
Antibody Screen: NEGATIVE
Unit division: 0
Unit division: 0

## 2013-07-21 ENCOUNTER — Encounter: Payer: Self-pay | Admitting: Oncology

## 2013-07-21 ENCOUNTER — Other Ambulatory Visit: Payer: Self-pay | Admitting: *Deleted

## 2013-07-21 ENCOUNTER — Other Ambulatory Visit (HOSPITAL_BASED_OUTPATIENT_CLINIC_OR_DEPARTMENT_OTHER): Payer: Medicare Other

## 2013-07-21 ENCOUNTER — Ambulatory Visit (HOSPITAL_BASED_OUTPATIENT_CLINIC_OR_DEPARTMENT_OTHER): Payer: Medicare Other | Admitting: Oncology

## 2013-07-21 VITALS — BP 89/74 | HR 78 | Temp 98.0°F | Resp 19 | Ht 62.0 in | Wt 167.8 lb

## 2013-07-21 DIAGNOSIS — D649 Anemia, unspecified: Secondary | ICD-10-CM

## 2013-07-21 DIAGNOSIS — D47Z9 Other specified neoplasms of uncertain behavior of lymphoid, hematopoietic and related tissue: Secondary | ICD-10-CM

## 2013-07-21 DIAGNOSIS — D469 Myelodysplastic syndrome, unspecified: Secondary | ICD-10-CM

## 2013-07-21 DIAGNOSIS — R945 Abnormal results of liver function studies: Secondary | ICD-10-CM

## 2013-07-21 DIAGNOSIS — R7989 Other specified abnormal findings of blood chemistry: Secondary | ICD-10-CM

## 2013-07-21 HISTORY — DX: Hemochromatosis due to repeated red blood cell transfusions: E83.111

## 2013-07-21 LAB — CBC WITH DIFFERENTIAL/PLATELET
BASO%: 0.3 % (ref 0.0–2.0)
Basophils Absolute: 0 10*3/uL (ref 0.0–0.1)
EOS ABS: 0.3 10*3/uL (ref 0.0–0.5)
EOS%: 2.7 % (ref 0.0–7.0)
HCT: 27.1 % — ABNORMAL LOW (ref 34.8–46.6)
HGB: 9 g/dL — ABNORMAL LOW (ref 11.6–15.9)
LYMPH%: 28.5 % (ref 14.0–49.7)
MCH: 31.5 pg (ref 25.1–34.0)
MCHC: 33.3 g/dL (ref 31.5–36.0)
MCV: 94.6 fL (ref 79.5–101.0)
MONO#: 0.6 10*3/uL (ref 0.1–0.9)
MONO%: 5.5 % (ref 0.0–14.0)
NEUT#: 7 10*3/uL — ABNORMAL HIGH (ref 1.5–6.5)
NEUT%: 63 % (ref 38.4–76.8)
Platelets: 462 10*3/uL — ABNORMAL HIGH (ref 145–400)
RBC: 2.86 10*6/uL — ABNORMAL LOW (ref 3.70–5.45)
RDW: 23.6 % — ABNORMAL HIGH (ref 11.2–14.5)
WBC: 11.2 10*3/uL — ABNORMAL HIGH (ref 3.9–10.3)
lymph#: 3.2 10*3/uL (ref 0.9–3.3)

## 2013-07-21 LAB — COMPREHENSIVE METABOLIC PANEL
ALK PHOS: 75 U/L (ref 39–117)
ALT: 30 U/L (ref 0–35)
AST: 35 U/L (ref 0–37)
Albumin: 4.6 g/dL (ref 3.5–5.2)
BUN: 23 mg/dL (ref 6–23)
CO2: 24 mEq/L (ref 19–32)
CREATININE: 0.76 mg/dL (ref 0.50–1.10)
Calcium: 9.8 mg/dL (ref 8.4–10.5)
Chloride: 99 mEq/L (ref 96–112)
GLUCOSE: 100 mg/dL — AB (ref 70–99)
Potassium: 4.4 mEq/L (ref 3.5–5.3)
Sodium: 137 mEq/L (ref 135–145)
TOTAL PROTEIN: 6.6 g/dL (ref 6.0–8.3)
Total Bilirubin: 1.8 mg/dL — ABNORMAL HIGH (ref 0.3–1.2)

## 2013-07-21 LAB — LACTATE DEHYDROGENASE: LDH: 141 U/L (ref 94–250)

## 2013-07-21 LAB — HOLD TUBE, BLOOD BANK

## 2013-07-22 ENCOUNTER — Telehealth: Payer: Self-pay | Admitting: Oncology

## 2013-07-22 ENCOUNTER — Other Ambulatory Visit: Payer: Self-pay | Admitting: Oncology

## 2013-07-22 DIAGNOSIS — D469 Myelodysplastic syndrome, unspecified: Secondary | ICD-10-CM

## 2013-07-22 LAB — FERRITIN CHCC

## 2013-07-22 NOTE — Telephone Encounter (Signed)
appts made per 1/6 POF sw pt AVS and CAL mailed shh

## 2013-07-22 NOTE — Progress Notes (Signed)
Hematology and Oncology Follow Up Visit  Carolyn Brennan [id number] Nov 02, 1940 73 y.o. 07/22/2013 5:29 PM   Principle Diagnosis: Encounter Diagnoses  Name Primary?  [name] MDS/MPN (myelodysplastic/myeloproliferative neoplasms) Yes  . Anemia   . Iron overload due to repeated red blood cell transfusions      Interim History:   Follow up visit for this 73 year old woman with a myeloproliferative disorder with dysplastic features. She has become transfusion-dependent for red blood cells. She is requiring blood every 3-4 weeks. She failed a trial of Aranesp and Procrit. Blood counts as far back as July 2007 showed a macrocytic anemia with hemoglobin 11 g, leukocytosis with white count 13,600, and mild elevation of her platelet count of 399,000. She was referred to our office in April 2008. Initial bone marrow done by my partner Dr. Marin Olp showed a hypercellular marrow with ring sideroblasts. Routine cytogenetics were normal. FISH studies not done on the marrow. She tested negative for the JAK-2 mutation. Most recent bone marrow done 01/25/2011 shows normal cytogenetics. No evidence for the 5Q deletion on FISH. This marrow changed little compared with the initial study done at diagnosis in April of 2008. Marrow hypercellular for age 27% with prominent erythroid proliferation, megakaryocyte proliferation with the large and abnormal forms. No excess blasts. Normal maturation in the myeloid series. dysplastic changes in the erythroid series.  She has become transfusion-dependent for red cells and is getting blood about every 2-3 weeks. Ferritin levels are climbing.  After much discussion and deliberation, she agreed to a trial of Exjade started in September 2014. Peak ferritin almost 5000 in May 2014 but this correlated with an acute infectious process-staphylococcal aureus bacteremia. A more reliable baseline obtained 05/13/2013 was 2236 units. Overall she is tolerating the drug well although she causes her  "ugly pill". Ferritin levels have not really changed much since being on the drug. She was 3030 units in November and 3100 units today. At least levels are not going up.  She did have an eye exam in October 2014.  She has had no interim medical problems.   Medications: reviewed  Allergies: No Known Allergies  Review of Systems: Hematology:  No bleeding or bruising ENT ROS: No sore throat Breast ROS:  Respiratory ROS: No cough or dyspnea Cardiovascular ROS:  No chest pain or palpitations Gastrointestinal ROS:   No abdominal pain or change in bowel habit Genito-Urinary ROS not questioned Musculoskeletal ROS: No muscle or bone pain Neurological ROS: No headache or change in vision Dermatological ROS: No rash Remaining ROS negative.  Physical Exam: Blood pressure 89/74, pulse 78, temperature 98 F (36.7 C), temperature source Oral, resp. rate 19, height 5\' 2"  (1.575 m), weight 167 lb 12.8 oz (76.114 kg). Wt Readings from Last 3 Encounters:  07/21/13 167 lb 12.8 oz (76.114 kg)  05/12/13 168 lb (76.204 kg)  03/10/13 166 lb 4.8 oz (75.433 kg)     General appearance: Well-nourished Caucasian woman HENNT: Pharynx no erythema, exudate, mass, or ulcer. No thyromegaly or thyroid nodules Lymph nodes: No cervical, supraclavicular, or axillary lymphadenopathy Breasts:  Lungs: Clear to auscultation, resonant to percussion throughout Heart: Regular rhythm, no murmur, no gallop, no rub, no click, no edema Abdomen: Soft, nontender, normal bowel sounds, no mass,  spleen about 4 cm below left costal margin Extremities: No edema, no calf tenderness Musculoskeletal: no joint deformities GU:  Vascular: Carotid pulses 2+, no bruits, Neurologic: Alert, oriented, PERRLA, cranial nerves grossly normal, motor strength 5 over 5, reflexes 1+ symmetric, upper body coordination  normal, gait normal, Skin: No rash or ecchymosis  Lab Results: CBC W/Diff    Component Value Date/Time   WBC 11.2*  07/21/2013 1550   WBC 9.9 01/01/2013 1145   WBC 16.6* 04/16/2012 1858   RBC 2.86* 07/21/2013 1550   RBC 2.49* 01/01/2013 1145   RBC 2.80* 04/16/2012 1858   HGB 9.0* 07/21/2013 1550   HGB 7.6* 01/01/2013 1145   HGB 8.4* 04/16/2012 1858   HCT 27.1* 07/21/2013 1550   HCT 22.6* 01/01/2013 1145   HCT 27.2* 04/16/2012 1858   PLT 462* 07/21/2013 1550   PLT 497* 01/01/2013 1145   MCV 94.6 07/21/2013 1550   MCV 90.8 01/01/2013 1145   MCV 97.1* 04/16/2012 1858   MCH 31.5 07/21/2013 1550   MCH 30.5 01/01/2013 1145   MCH 30.0 04/16/2012 1858   MCHC 33.3 07/21/2013 1550   MCHC 33.6 01/01/2013 1145   MCHC 30.9* 04/16/2012 1858   RDW 23.6* 07/21/2013 1550   RDW 22.5* 01/01/2013 1145   LYMPHSABS 3.2 07/21/2013 1550   LYMPHSABS 0.6* 11/16/2012 2215   MONOABS 0.6 07/21/2013 1550   MONOABS 0.3 11/16/2012 2215   EOSABS 0.3 07/21/2013 1550   EOSABS 0.0 11/16/2012 2215   BASOSABS 0.0 07/21/2013 1550   BASOSABS 0.0 11/16/2012 2215     Chemistry      Component Value Date/Time   NA 137 07/21/2013 1550   NA 140 06/23/2013 1154   K 4.4 07/21/2013 1550   K 4.2 06/23/2013 1154   CL 99 07/21/2013 1550   CL 103 09/01/2012 1453   CO2 24 07/21/2013 1550   CO2 22 06/23/2013 1154   BUN 23 07/21/2013 1550   BUN 14.9 06/23/2013 1154   CREATININE 0.76 07/21/2013 1550   CREATININE 0.7 06/23/2013 1154      Component Value Date/Time   CALCIUM 9.8 07/21/2013 1550   CALCIUM 9.4 06/23/2013 1154   ALKPHOS 75 07/21/2013 1550   ALKPHOS 78 06/23/2013 1154   AST 35 07/21/2013 1550   AST 34 06/23/2013 1154   ALT 30 07/21/2013 1550   ALT 40 06/23/2013 1154   BILITOT 1.8* 07/21/2013 1550   BILITOT 2.38* 06/23/2013 1154        Impression:  #1. Myeloproliferative disorder with dysplastic features.   #2. 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   #3. Iron overload from poly transfusion  Continue Exjade.  #4. Idiopathic Staphylococcus aureus sepsis. May 2014  #5. Status post Mohs surgery to excise a basal cell carcinoma bridge of nose  #6. Hepatosplenomegaly secondary  to her underlying myeloproliferative disorder  #7. Chronic anxiety and depression   CC: Patient Care Team: Marjorie Smolder, MD as PCP - General (Family Medicine) Annia Belt, MD as Consulting Physician (Hematology and Oncology)   Annia Belt, MD 1/7/20155:29 PM

## 2013-07-23 ENCOUNTER — Telehealth: Payer: Self-pay | Admitting: *Deleted

## 2013-07-23 NOTE — Telephone Encounter (Signed)
Message copied by Jesse Fall on Thu Jul 23, 2013 12:02 PM ------      Message from: Annia Belt      Created: Wed Jul 22, 2013 11:35 AM       Call pt ferritin 3,112 no change from last month.  Stay on "ugly pill" ------

## 2013-07-23 NOTE — Telephone Encounter (Signed)
Informed pt of elevated ferritin result & no real change from last month & per Dr Beryle Beams to cont "ugly pill" (exjade).  She laughed & said she called it "stupid pill" but is now smart pill b/c Dr. Darnell Level says it might raise her hemoglobin.

## 2013-08-04 ENCOUNTER — Other Ambulatory Visit (HOSPITAL_BASED_OUTPATIENT_CLINIC_OR_DEPARTMENT_OTHER): Payer: Medicare Other

## 2013-08-04 ENCOUNTER — Telehealth: Payer: Self-pay | Admitting: *Deleted

## 2013-08-04 DIAGNOSIS — D649 Anemia, unspecified: Secondary | ICD-10-CM

## 2013-08-04 DIAGNOSIS — D469 Myelodysplastic syndrome, unspecified: Secondary | ICD-10-CM

## 2013-08-04 DIAGNOSIS — D47Z9 Other specified neoplasms of uncertain behavior of lymphoid, hematopoietic and related tissue: Secondary | ICD-10-CM

## 2013-08-04 LAB — CBC WITH DIFFERENTIAL/PLATELET
BASO%: 1.7 % (ref 0.0–2.0)
Basophils Absolute: 0.2 10*3/uL — ABNORMAL HIGH (ref 0.0–0.1)
EOS%: 2.7 % (ref 0.0–7.0)
Eosinophils Absolute: 0.3 10*3/uL (ref 0.0–0.5)
HEMATOCRIT: 25.1 % — AB (ref 34.8–46.6)
HGB: 8 g/dL — ABNORMAL LOW (ref 11.6–15.9)
LYMPH%: 32.7 % (ref 14.0–49.7)
MCH: 30.9 pg (ref 25.1–34.0)
MCHC: 31.7 g/dL (ref 31.5–36.0)
MCV: 97.3 fL (ref 79.5–101.0)
MONO#: 0.2 10*3/uL (ref 0.1–0.9)
MONO%: 2.1 % (ref 0.0–14.0)
NEUT#: 7 10*3/uL — ABNORMAL HIGH (ref 1.5–6.5)
NEUT%: 60.8 % (ref 38.4–76.8)
PLATELETS: 450 10*3/uL — AB (ref 145–400)
RBC: 2.58 10*6/uL — AB (ref 3.70–5.45)
RDW: 26.4 % — ABNORMAL HIGH (ref 11.2–14.5)
WBC: 11.6 10*3/uL — ABNORMAL HIGH (ref 3.9–10.3)
lymph#: 3.8 10*3/uL — ABNORMAL HIGH (ref 0.9–3.3)

## 2013-08-04 LAB — HOLD TUBE, BLOOD BANK

## 2013-08-04 NOTE — Telephone Encounter (Signed)
Called pt at home & she reports that she feels OK & doesn't need blood & will be back in 2 wks for labs or will call sooner if need be.  Informed lab-no T&CM.

## 2013-08-15 ENCOUNTER — Encounter: Payer: Self-pay | Admitting: *Deleted

## 2013-08-17 ENCOUNTER — Ambulatory Visit (HOSPITAL_BASED_OUTPATIENT_CLINIC_OR_DEPARTMENT_OTHER): Payer: Medicare Other

## 2013-08-17 ENCOUNTER — Telehealth: Payer: Self-pay | Admitting: *Deleted

## 2013-08-17 DIAGNOSIS — D649 Anemia, unspecified: Secondary | ICD-10-CM

## 2013-08-17 DIAGNOSIS — D469 Myelodysplastic syndrome, unspecified: Secondary | ICD-10-CM

## 2013-08-17 DIAGNOSIS — D47Z9 Other specified neoplasms of uncertain behavior of lymphoid, hematopoietic and related tissue: Secondary | ICD-10-CM

## 2013-08-17 LAB — CBC WITH DIFFERENTIAL/PLATELET
BASO%: 0.4 % (ref 0.0–2.0)
Basophils Absolute: 0.1 10*3/uL (ref 0.0–0.1)
EOS%: 2.6 % (ref 0.0–7.0)
Eosinophils Absolute: 0.3 10*3/uL (ref 0.0–0.5)
HCT: 26 % — ABNORMAL LOW (ref 34.8–46.6)
HGB: 8.4 g/dL — ABNORMAL LOW (ref 11.6–15.9)
LYMPH%: 28.9 % (ref 14.0–49.7)
MCH: 32.2 pg (ref 25.1–34.0)
MCHC: 32.2 g/dL (ref 31.5–36.0)
MCV: 100 fL (ref 79.5–101.0)
MONO#: 0.6 10*3/uL (ref 0.1–0.9)
MONO%: 5.2 % (ref 0.0–14.0)
NEUT#: 7.3 10*3/uL — ABNORMAL HIGH (ref 1.5–6.5)
NEUT%: 62.9 % (ref 38.4–76.8)
Platelets: 359 10*3/uL (ref 145–400)
RBC: 2.6 10*6/uL — AB (ref 3.70–5.45)
RDW: 28.6 % — AB (ref 11.2–14.5)
WBC: 11.7 10*3/uL — ABNORMAL HIGH (ref 3.9–10.3)
lymph#: 3.4 10*3/uL — ABNORMAL HIGH (ref 0.9–3.3)

## 2013-08-17 LAB — HOLD TUBE, BLOOD BANK

## 2013-08-17 NOTE — Telephone Encounter (Signed)
sw pt informed her that G will be on call 09/28/13. gv appt for 09/23/13 w/ labs@ 3:15p and ov@ 3:45p. Made the pt aware that i will mail a letter/avs...td

## 2013-08-17 NOTE — Telephone Encounter (Signed)
Patient called.  She is due for labs tomorrow.  She thinks she may need a transfusion and wants to have it checked today if possible. Hbg is 8.4 today.  Went to talk with patient.  Found her in [place], she says she was lightheaded and needed to eat.  In talking with patient, found she had been on a 3 day diet and lost 4 pounds. She had not eaten since 8:30am and it was now 1:30pm.   Asked her how she felt now that she had eaten (meatball sub and coke)  and she said much better, her eyes felt clearer and she is in general much better.   Discussed with her that this may be a blood sugar issue and not a hemoglobin issue.  Discussed with patient that  crash diets are not good way to loose weight and she would be much better served by doing small frequent meals with a good mix of protein and carbohydrates. She should probably eat something every 2-3 hours to keep a more even blood sugar level.  Patient verbalized understanding and will return in 2 weeks to check her lab again.  She knows to call us in the interim if she starts having sx.  Told her to let me know if she does not continue to feel better. Note:  Patient continues on exjade for iron overload.

## 2013-08-18 ENCOUNTER — Other Ambulatory Visit: Payer: Medicare Other

## 2013-08-19 ENCOUNTER — Telehealth: Payer: Self-pay | Admitting: *Deleted

## 2013-08-19 DIAGNOSIS — F411 Generalized anxiety disorder: Secondary | ICD-10-CM

## 2013-08-19 MED ORDER — LORAZEPAM 0.5 MG PO TABS: 0.5000 mg | ORAL_TABLET | Freq: Every day | ORAL | Status: DC | PRN

## 2013-08-19 NOTE — Telephone Encounter (Signed)
Received vm call from pt stating that her [employer] drug plan will not pay for her ativan & stated that they will only cover for one tab daily.  She would like script written for ativan 1 tab daily prn & states she will run out in a couple of weeks.

## 2013-09-01 ENCOUNTER — Other Ambulatory Visit: Payer: Medicare Other

## 2013-09-02 ENCOUNTER — Telehealth: Payer: Self-pay | Admitting: *Deleted

## 2013-09-02 ENCOUNTER — Ambulatory Visit (HOSPITAL_BASED_OUTPATIENT_CLINIC_OR_DEPARTMENT_OTHER): Payer: Medicare Other

## 2013-09-02 DIAGNOSIS — D649 Anemia, unspecified: Secondary | ICD-10-CM

## 2013-09-02 DIAGNOSIS — R7989 Other specified abnormal findings of blood chemistry: Secondary | ICD-10-CM

## 2013-09-02 DIAGNOSIS — R945 Abnormal results of liver function studies: Secondary | ICD-10-CM

## 2013-09-02 DIAGNOSIS — D469 Myelodysplastic syndrome, unspecified: Secondary | ICD-10-CM

## 2013-09-02 DIAGNOSIS — D47Z9 Other specified neoplasms of uncertain behavior of lymphoid, hematopoietic and related tissue: Secondary | ICD-10-CM

## 2013-09-02 LAB — COMPREHENSIVE METABOLIC PANEL (CC13)
ALK PHOS: 65 U/L (ref 40–150)
ALT: 26 U/L (ref 0–55)
AST: 24 U/L (ref 5–34)
Albumin: 4.6 g/dL (ref 3.5–5.0)
Anion Gap: 9 mEq/L (ref 3–11)
BUN: 21.3 mg/dL (ref 7.0–26.0)
CO2: 26 mEq/L (ref 22–29)
Calcium: 9.8 mg/dL (ref 8.4–10.4)
Chloride: 105 mEq/L (ref 98–109)
Creatinine: 0.8 mg/dL (ref 0.6–1.1)
GLUCOSE: 104 mg/dL (ref 70–140)
Potassium: 4.5 mEq/L (ref 3.5–5.1)
SODIUM: 140 meq/L (ref 136–145)
TOTAL PROTEIN: 6.3 g/dL — AB (ref 6.4–8.3)
Total Bilirubin: 2.76 mg/dL — ABNORMAL HIGH (ref 0.20–1.20)

## 2013-09-02 LAB — LACTATE DEHYDROGENASE (CC13): LDH: 194 U/L (ref 125–245)

## 2013-09-02 LAB — CBC WITH DIFFERENTIAL/PLATELET
BASO%: 1.8 % (ref 0.0–2.0)
Basophils Absolute: 0.2 10*3/uL — ABNORMAL HIGH (ref 0.0–0.1)
EOS%: 1.5 % (ref 0.0–7.0)
Eosinophils Absolute: 0.2 10*3/uL (ref 0.0–0.5)
HCT: 23.8 % — ABNORMAL LOW (ref 34.8–46.6)
HGB: 7.5 g/dL — ABNORMAL LOW (ref 11.6–15.9)
LYMPH%: 27.5 % (ref 14.0–49.7)
MCH: 33.5 pg (ref 25.1–34.0)
MCHC: 31.8 g/dL (ref 31.5–36.0)
MCV: 105.5 fL — ABNORMAL HIGH (ref 79.5–101.0)
MONO#: 0.7 10*3/uL (ref 0.1–0.9)
MONO%: 7.5 % (ref 0.0–14.0)
NEUT#: 6 10*3/uL (ref 1.5–6.5)
NEUT%: 61.7 % (ref 38.4–76.8)
Platelets: 389 10*3/uL (ref 145–400)
RBC: 2.25 10*6/uL — ABNORMAL LOW (ref 3.70–5.45)
RDW: 30.5 % — ABNORMAL HIGH (ref 11.2–14.5)
WBC: 9.8 10*3/uL (ref 3.9–10.3)
lymph#: 2.7 10*3/uL (ref 0.9–3.3)

## 2013-09-02 LAB — TECHNOLOGIST REVIEW

## 2013-09-02 LAB — HOLD TUBE, BLOOD BANK

## 2013-09-02 NOTE — Telephone Encounter (Signed)
Per Dr Beryle Beams, pt seen in hallway & blood transfusion not needed.  Pt reported feeling good & went home.

## 2013-09-03 ENCOUNTER — Telehealth: Payer: Self-pay | Admitting: *Deleted

## 2013-09-03 ENCOUNTER — Other Ambulatory Visit: Payer: Self-pay | Admitting: *Deleted

## 2013-09-03 ENCOUNTER — Ambulatory Visit (HOSPITAL_COMMUNITY)
Admission: RE | Admit: 2013-09-03 | Discharge: 2013-09-03 | Disposition: A | Discharge status: Home / Self Care | Payer: Medicare Other | Source: Ambulatory Visit | Attending: Oncology | Admitting: Oncology

## 2013-09-03 DIAGNOSIS — D649 Anemia, unspecified: Secondary | ICD-10-CM | POA: Insufficient documentation

## 2013-09-03 MED ORDER — LORAZEPAM 2 MG/ML IJ SOLN: 1.0000 mg | Freq: Once | INTRAMUSCULAR | Status: DC

## 2013-09-03 NOTE — Telephone Encounter (Signed)
Received vm call from pt stating that she was feeling great yest & pretty good today but breathing is not good & could just be the weather but she is inside.  Returned call & she is a little short winded & it is a little harder for her to do activities this am.  She is having to sit down frequently to rest.  Discussed with Ned Card NP & pt set up for T&CM for tomorrow & transfusion of 2 units of blood & informed to be here 2 10:30 am.  She expressed understanding & appreciation.

## 2013-09-04 ENCOUNTER — Ambulatory Visit (HOSPITAL_BASED_OUTPATIENT_CLINIC_OR_DEPARTMENT_OTHER): Payer: Medicare Other

## 2013-09-04 ENCOUNTER — Other Ambulatory Visit (HOSPITAL_BASED_OUTPATIENT_CLINIC_OR_DEPARTMENT_OTHER): Payer: Medicare Other

## 2013-09-04 VITALS — BP 120/49 | HR 73 | Temp 97.6°F | Resp 18

## 2013-09-04 DIAGNOSIS — D469 Myelodysplastic syndrome, unspecified: Secondary | ICD-10-CM

## 2013-09-04 DIAGNOSIS — D649 Anemia, unspecified: Secondary | ICD-10-CM

## 2013-09-04 DIAGNOSIS — D47Z9 Other specified neoplasms of uncertain behavior of lymphoid, hematopoietic and related tissue: Secondary | ICD-10-CM

## 2013-09-04 LAB — CBC WITH DIFFERENTIAL/PLATELET
BASO%: 1.5 % (ref 0.0–2.0)
Basophils Absolute: 0.1 10*3/uL (ref 0.0–0.1)
EOS ABS: 0.2 10*3/uL (ref 0.0–0.5)
EOS%: 1.8 % (ref 0.0–7.0)
HEMATOCRIT: 24.5 % — AB (ref 34.8–46.6)
HEMOGLOBIN: 7.8 g/dL — AB (ref 11.6–15.9)
LYMPH#: 2.3 10*3/uL (ref 0.9–3.3)
LYMPH%: 25 % (ref 14.0–49.7)
MCH: 33.9 pg (ref 25.1–34.0)
MCHC: 31.7 g/dL (ref 31.5–36.0)
MCV: 107 fL — ABNORMAL HIGH (ref 79.5–101.0)
MONO#: 0.5 10*3/uL (ref 0.1–0.9)
MONO%: 5.4 % (ref 0.0–14.0)
NEUT%: 66.3 % (ref 38.4–76.8)
NEUTROS ABS: 6.1 10*3/uL (ref 1.5–6.5)
Platelets: 406 10*3/uL — ABNORMAL HIGH (ref 145–400)
RBC: 2.29 10*6/uL — ABNORMAL LOW (ref 3.70–5.45)
RDW: 29.8 % — ABNORMAL HIGH (ref 11.2–14.5)
WBC: 9.2 10*3/uL (ref 3.9–10.3)

## 2013-09-04 LAB — PREPARE RBC (CROSSMATCH)

## 2013-09-04 MED ORDER — LORAZEPAM 2 MG/ML IJ SOLN
1.0000 mg | Freq: Once | INTRAMUSCULAR | Status: AC
  Administered 2013-09-04: 1 mg via INTRAVENOUS

## 2013-09-04 MED ORDER — SODIUM CHLORIDE 0.9 % IV SOLN
250.0000 mL | Freq: Once | INTRAVENOUS | Status: AC
Start: 2013-09-04 — End: 2013-09-04
  Administered 2013-09-04: 250 mL via INTRAVENOUS

## 2013-09-04 MED ORDER — HEPARIN SOD (PORK) LOCK FLUSH 100 UNIT/ML IV SOLN
250.0000 [IU] | INTRAVENOUS | Status: DC | PRN
  Filled 2013-09-04: qty 5

## 2013-09-04 MED ORDER — LORAZEPAM 2 MG/ML IJ SOLN
INTRAMUSCULAR | Status: AC
  Filled 2013-09-04: qty 1

## 2013-09-04 MED ORDER — SODIUM CHLORIDE 0.9 % IJ SOLN
3.0000 mL | INTRAMUSCULAR | Status: DC | PRN
  Filled 2013-09-04: qty 10

## 2013-09-04 MED ORDER — HEPARIN SOD (PORK) LOCK FLUSH 100 UNIT/ML IV SOLN
500.0000 [IU] | Freq: Every day | INTRAVENOUS | Status: AC | PRN
  Administered 2013-09-04: 500 [IU]
  Filled 2013-09-04: qty 5

## 2013-09-04 MED ORDER — SODIUM CHLORIDE 0.9 % IJ SOLN
10.0000 mL | INTRAMUSCULAR | Status: AC | PRN
  Administered 2013-09-04: 10 mL
  Filled 2013-09-04: qty 10

## 2013-09-04 NOTE — Patient Instructions (Signed)
Blood Transfusion  A blood transfusion replaces your blood or some of its parts. Blood is replaced when you have lost blood because of surgery, an accident, or for severe blood conditions like anemia. You can donate blood to be used on yourself if you have a planned surgery. If you lose blood during that surgery, your own blood can be given back to you. Any blood given to you is checked to make sure it matches your blood type. Your temperature, blood pressure, and heart rate (vital signs) will be checked often.  GET HELP RIGHT AWAY IF:   You feel sick to your stomach (nauseous) or throw up (vomit).  You have watery poop (diarrhea).  You have shortness of breath or trouble breathing.  You have blood in your pee (urine) or have dark colored pee.  You have chest pain or tightness.  Your eyes or skin turn yellow (jaundice).  You have a temperature by mouth above 102 F (38.9 C), not controlled by medicine.  You start to shake and have chills.  You develop a a red rash (hives) or feel itchy.  You develop lightheadedness or feel confused.  You develop back, joint, or muscle pain.  You do not feel hungry (lost appetite).  You feel tired, restless, or nervous.  You develop belly (abdominal) cramps. Document Released: 09/28/2008 Document Revised: 09/24/2011 Document Reviewed: 09/28/2008 [employer] Patient Information 2014 [employer].  

## 2013-09-05 LAB — TYPE AND SCREEN
ABO/RH(D): A POS
Antibody Screen: NEGATIVE
UNIT DIVISION: 0
Unit division: 0

## 2013-09-11 ENCOUNTER — Other Ambulatory Visit: Payer: Self-pay | Admitting: Oncology

## 2013-09-12 ENCOUNTER — Encounter: Payer: Self-pay | Admitting: Oncology

## 2013-09-15 ENCOUNTER — Other Ambulatory Visit (HOSPITAL_BASED_OUTPATIENT_CLINIC_OR_DEPARTMENT_OTHER): Payer: Medicare Other

## 2013-09-15 DIAGNOSIS — D469 Myelodysplastic syndrome, unspecified: Secondary | ICD-10-CM

## 2013-09-15 DIAGNOSIS — D649 Anemia, unspecified: Secondary | ICD-10-CM

## 2013-09-15 DIAGNOSIS — D47Z9 Other specified neoplasms of uncertain behavior of lymphoid, hematopoietic and related tissue: Secondary | ICD-10-CM

## 2013-09-15 LAB — CBC WITH DIFFERENTIAL/PLATELET
BASO%: 2.7 % — ABNORMAL HIGH (ref 0.0–2.0)
BASOS ABS: 0.3 10*3/uL — AB (ref 0.0–0.1)
EOS%: 2.5 % (ref 0.0–7.0)
Eosinophils Absolute: 0.2 10*3/uL (ref 0.0–0.5)
HCT: 27.4 % — ABNORMAL LOW (ref 34.8–46.6)
HEMOGLOBIN: 8.8 g/dL — AB (ref 11.6–15.9)
LYMPH%: 30 % (ref 14.0–49.7)
MCH: 32.5 pg (ref 25.1–34.0)
MCHC: 32 g/dL (ref 31.5–36.0)
MCV: 101.4 fL — AB (ref 79.5–101.0)
MONO#: 0.5 10*3/uL (ref 0.1–0.9)
MONO%: 5.2 % (ref 0.0–14.0)
NEUT%: 59.6 % (ref 38.4–76.8)
NEUTROS ABS: 5.8 10*3/uL (ref 1.5–6.5)
PLATELETS: 403 10*3/uL — AB (ref 145–400)
RBC: 2.71 10*6/uL — ABNORMAL LOW (ref 3.70–5.45)
RDW: 26.2 % — ABNORMAL HIGH (ref 11.2–14.5)
WBC: 9.8 10*3/uL (ref 3.9–10.3)
lymph#: 2.9 10*3/uL (ref 0.9–3.3)

## 2013-09-15 LAB — FERRITIN CHCC: Ferritin: 2657 ng/ml — ABNORMAL HIGH (ref 9–269)

## 2013-09-17 ENCOUNTER — Telehealth: Payer: Self-pay | Admitting: *Deleted

## 2013-09-17 NOTE — Telephone Encounter (Signed)
Notified pt of ferritin level per Dr Azucena Freed instructions.

## 2013-09-17 NOTE — Telephone Encounter (Signed)
Message copied by Jesse Fall on Thu Sep 17, 2013  5:08 PM ------      Message from: Annia Belt      Created: Tue Sep 15, 2013  4:26 PM       Call pt: ferritin level down from 3,100 to 2,600 ------

## 2013-09-18 ENCOUNTER — Encounter (HOSPITAL_COMMUNITY): Payer: Self-pay | Admitting: Emergency Medicine

## 2013-09-18 ENCOUNTER — Emergency Department (HOSPITAL_COMMUNITY)
Admission: EM | Admit: 2013-09-18 | Discharge: 2013-09-18 | Disposition: A | Discharge status: Home / Self Care | Payer: Medicare Other | Attending: Emergency Medicine | Admitting: Emergency Medicine

## 2013-09-18 ENCOUNTER — Telehealth: Payer: Self-pay | Admitting: *Deleted

## 2013-09-18 DIAGNOSIS — Z7982 Long term (current) use of aspirin: Secondary | ICD-10-CM | POA: Insufficient documentation

## 2013-09-18 DIAGNOSIS — F341 Dysthymic disorder: Secondary | ICD-10-CM | POA: Insufficient documentation

## 2013-09-18 DIAGNOSIS — Z87891 Personal history of nicotine dependence: Secondary | ICD-10-CM | POA: Insufficient documentation

## 2013-09-18 DIAGNOSIS — D47Z9 Other specified neoplasms of uncertain behavior of lymphoid, hematopoietic and related tissue: Secondary | ICD-10-CM | POA: Insufficient documentation

## 2013-09-18 DIAGNOSIS — Z79899 Other long term (current) drug therapy: Secondary | ICD-10-CM | POA: Insufficient documentation

## 2013-09-18 DIAGNOSIS — R040 Epistaxis: Secondary | ICD-10-CM

## 2013-09-18 LAB — HEMOGLOBIN AND HEMATOCRIT, BLOOD
HCT: 25.5 % — ABNORMAL LOW (ref 36.0–46.0)
HEMOGLOBIN: 8.1 g/dL — AB (ref 12.0–15.0)

## 2013-09-18 MED ORDER — CEPHALEXIN 500 MG PO CAPS: 500.0000 mg | ORAL_CAPSULE | Freq: Four times a day (QID) | ORAL | Status: DC

## 2013-09-18 MED ORDER — OXYMETAZOLINE HCL 0.05 % NA SOLN
1.0000 | Freq: Once | NASAL | Status: AC
  Administered 2013-09-18: 1 via NASAL
  Filled 2013-09-18: qty 15

## 2013-09-18 NOTE — ED Notes (Signed)
ENT @ Bedside.  

## 2013-09-18 NOTE — ED Notes (Signed)
Pt reports to ed c/o epistaxis x 12 hours, sts hx of same. Pt also reports hx of myelodysplasia.

## 2013-09-18 NOTE — Discharge Instructions (Signed)
Nosebleed You need to have packing removed in 3 days.    Nosebleeds can be caused by many conditions including trauma, infections, polyps, foreign bodies, dry mucous membranes or climate, medications and air conditioning. Most nosebleeds occur in the front of the nose. It is because of this location that most nosebleeds can be controlled by pinching the nostrils gently and continuously. Do this for at least 10 to 20 minutes. The reason for this long continuous pressure is that you must hold it long enough for the blood to clot. If during that 10 to 20 minute time period, pressure is released, the process may have to be started again. The nosebleed may stop by itself, quit with pressure, need concentrated heating (cautery) or stop with pressure from packing. HOME CARE INSTRUCTIONS   If your nose was packed, try to maintain the pack inside until your caregiver removes it. If a gauze pack was used and it starts to fall out, gently replace or cut the end off. Do not cut if a balloon catheter was used to pack the nose. Otherwise, do not remove unless instructed.  Avoid blowing your nose for 12 hours after treatment. This could dislodge the pack or clot and start bleeding again.  If the bleeding starts again, sit up and bending forward, gently pinch the front half of your nose continuously for 20 minutes.  If bleeding was caused by dry mucous membranes, cover the inside of your nose every morning with a petroleum or antibiotic ointment. Use your little fingertip as an applicator. Do this as needed during dry weather. This will keep the mucous membranes moist and allow them to heal.  Maintain humidity in your home by using less air conditioning or using a humidifier.  Do not use aspirin or medications which make bleeding more likely. Your caregiver can give you recommendations on this.  Resume normal activities as able but try to avoid straining, lifting or bending at the waist for several days.  If the  nosebleeds become recurrent and the cause is unknown, your caregiver may suggest laboratory tests. SEEK IMMEDIATE MEDICAL CARE IF:   Bleeding recurs and cannot be controlled.  There is unusual bleeding from or bruising on other parts of the body.  You have a fever.  Nosebleeds continue.  There is any worsening of the condition which originally brought you in.  You become lightheaded, feel faint, become sweaty or vomit blood. MAKE SURE YOU:   Understand these instructions.  Will watch your condition.  Will get help right away if you are not doing well or get worse. Document Released: 04/11/2005 Document Revised: 09/24/2011 Document Reviewed: 06/03/2009 [facility] Patient Information 2014 [place], [place].

## 2013-09-18 NOTE — ED Provider Notes (Signed)
CSN: [id number]     Arrival date & time 09/18/13  0757 History   First MD Initiated Contact with Patient 09/18/13 [phone]     Chief Complaint  Patient presents with  . Epistaxis     (Consider location/radiation/quality/duration/timing/severity/associated sxs/prior Treatment) HPI  This is a 73 year old female with a history of myelodysplastic syndrome who presents with epistaxis. Patient reports that she recently has had close to daily nosebleeds. Usually they self resolve. Patient states that her nosebleed started approximately 12 hours ago. It is mostly out of the left nare.  She states that she laid flat for 2 hours without any significant bleeding however every time she sits up "reports out." She states that during this time of year her house is very dry. She takes a daily aspirin daily. She denies any other symptoms.  Past Medical History  Diagnosis Date  . 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 Anxiety and depression 08/12/2011  . Anemia   . Iron overload due to repeated red blood cell transfusions 07/21/2013   Past Surgical History  Procedure Laterality Date  . Neck plastic surgery    . Abdominoplasty/panniculectomy    . Tubal ligation    . Breast reduction surgery    . Appendectomy    . Tee without cardioversion N/A 11/19/2012    Procedure: TRANSESOPHAGEAL ECHOCARDIOGRAM (TEE);  Surgeon: Jettie Booze, MD;  Location: [facility]COPY;  Service: Cardiovascular;  Laterality: N/A;   Family History  Problem Relation Age of Onset  . Breast cancer Mother   . Colon cancer Father 69   History  Substance Use Topics  . Smoking status: Former Smoker    Start date: 04/16/1992  . Smokeless tobacco: Not on file  . Alcohol Use: No   OB History   Grav Para Term Preterm Abortions TAB SAB Ect Mult Living   3 0        0     Review of Systems  HENT: Positive for nosebleeds.   Respiratory: Negative for chest tightness and shortness of breath.    Cardiovascular: Negative for chest pain.  Gastrointestinal: Negative for nausea and vomiting.      Allergies  Review of patient's allergies indicates no known allergies.  Home Medications   Current Outpatient Rx  Name  Route  Sig  Dispense  Refill  . aspirin EC 81 MG tablet   Oral   Take 81 mg by mouth daily.         . cholecalciferol (VITAMIN D) 1000 UNITS tablet   Oral   Take 1,000 Units by mouth daily.         [name] deferasirox (EXJADE) 500 MG disintegrating tablet   Oral   Take 1,500 mg by mouth daily before breakfast.         . ergocalciferol (VITAMIN D2) 50000 UNITS capsule   Oral   Take 50,000 Units by mouth 2 (two) times a week. On mondays and fridays         . FLUoxetine (PROZAC) 40 MG capsule   Oral   Take 40 mg by mouth every evening.          [name] HYDROcodone-acetaminophen (NORCO/VICODIN) 5-325 MG per tablet   Oral   Take 1 tablet by mouth every 6 (six) hours as needed for moderate pain.          [name] lidocaine-prilocaine (EMLA) cream   Topical   Apply topically as needed. Apply over port area 1-2 hours before treatment and cover with plastic  wrap   30 g   prn   . LORazepam (ATIVAN) 0.5 MG tablet   Oral   Take 1 tablet (0.5 mg total) by mouth daily as needed for anxiety or sleep. Or nausea.   30 tablet   3   . OVER THE COUNTER MEDICATION   Nasal   Place 1-2 application into the nose 2 (two) times daily as needed. Jello Nasal         . cephALEXin (KEFLEX) 500 MG capsule   Oral   Take 1 capsule (500 mg total) by mouth 4 (four) times daily.   12 capsule   0    BP 127/62  Pulse 79  Temp(Src) 97.4 F (36.3 C) (Oral)  Resp 18  SpO2 99% Physical Exam  Nursing note and vitals reviewed. Constitutional: She is oriented to person, place, and time. No distress.  HENT:  Head: Normocephalic and atraumatic.  Mouth/Throat: Oropharynx is clear and moist.  No active bleeding noted from the left nare  Cardiovascular: Normal rate, regular rhythm  and normal heart sounds.   No murmur heard. Pulmonary/Chest: Effort normal and breath sounds normal. No respiratory distress. She has no wheezes.  Neurological: She is alert and oriented to person, place, and time.  Skin: Skin is warm and dry.  Psychiatric: She has a normal mood and affect.    ED Course  EPISTAXIS MANAGEMENT Date/Time: 09/18/2013 11:00 AM Performed by: Thayer Jew, F Authorized by: Thayer Jew, F Consent: Verbal consent obtained. Risks and benefits: risks, benefits and alternatives were discussed Consent given by: patient Patient identity confirmed: verbally with patient Local anesthetic: topical anesthetic Patient sedated: no Treatment site: left anterior Repair method: silver nitrate and nasal balloon Post-procedure assessment: bleeding stopped Treatment complexity: complex Recurrence: recurrence of recent bleed Patient tolerance: Patient tolerated the procedure well with no immediate complications. Comments: Patient was anesthetized with 4% lidocaine. Initially friable tissue noted over the left nasal septum. Silver nitrate applied. Patient continued to have oozing. Merocel was placed and inflated with normal saline. Patient continued to do this around Merocel. Reviewed patient's chart and had a 4.5 cm Rhino Rocket balloon placed which successfully tamponaded bleeding prior. There is a was removed and Rhino Rocket Pl. With tamponade of bleeding.   (including critical care time) Labs Review Labs Reviewed  HEMOGLOBIN AND HEMATOCRIT, BLOOD - Abnormal; Notable for the following:    Hemoglobin 8.1 (*)    HCT 25.5 (*)    All other components within normal limits   Imaging Review No results found.   EKG Interpretation None      MDM   Final diagnoses:  Epistaxis    Patient presents with epistaxis. She reports frequent nosebleeds. Last bleed requiring packing was in November. Patient had oozing noted from left nares.  Epistaxis management performed  as above. Patient does have an ENT doctor who she can followup with on Monday. Have advised patient to maintain packing until that time. She is to return if she has any new bleeding or recurrence of bleeding. Hemoglobin 8.1 which is close to patient's baseline.  Patient will be discharged with Keflex for nasal packing.  After history, exam, and medical workup I feel the patient has been appropriately medically screened and is safe for discharge home. Pertinent diagnoses were discussed with the patient. Patient was given return precautions.    Merryl Hacker, MD 09/18/13 [phone]

## 2013-09-18 NOTE — Telephone Encounter (Signed)
Received vm call from pt stating that she is in the ED due to nose bleed all night.  Note to Dr Beryle Beams.

## 2013-09-22 ENCOUNTER — Other Ambulatory Visit: Payer: Medicare Other

## 2013-09-23 ENCOUNTER — Other Ambulatory Visit (HOSPITAL_BASED_OUTPATIENT_CLINIC_OR_DEPARTMENT_OTHER): Payer: Medicare Other

## 2013-09-23 ENCOUNTER — Ambulatory Visit (HOSPITAL_BASED_OUTPATIENT_CLINIC_OR_DEPARTMENT_OTHER): Payer: Medicare Other | Admitting: Oncology

## 2013-09-23 ENCOUNTER — Telehealth: Payer: Self-pay | Admitting: Oncology

## 2013-09-23 VITALS — BP 134/49 | HR 86 | Temp 97.8°F | Resp 18 | Ht 62.0 in | Wt 170.6 lb

## 2013-09-23 DIAGNOSIS — D47Z9 Other specified neoplasms of uncertain behavior of lymphoid, hematopoietic and related tissue: Secondary | ICD-10-CM

## 2013-09-23 DIAGNOSIS — K7689 Other specified diseases of liver: Secondary | ICD-10-CM

## 2013-09-23 DIAGNOSIS — R5383 Other fatigue: Secondary | ICD-10-CM

## 2013-09-23 DIAGNOSIS — R7989 Other specified abnormal findings of blood chemistry: Secondary | ICD-10-CM

## 2013-09-23 DIAGNOSIS — D469 Myelodysplastic syndrome, unspecified: Secondary | ICD-10-CM

## 2013-09-23 DIAGNOSIS — D649 Anemia, unspecified: Secondary | ICD-10-CM

## 2013-09-23 DIAGNOSIS — R945 Abnormal results of liver function studies: Secondary | ICD-10-CM

## 2013-09-23 DIAGNOSIS — R162 Hepatomegaly with splenomegaly, not elsewhere classified: Secondary | ICD-10-CM

## 2013-09-23 DIAGNOSIS — R5381 Other malaise: Secondary | ICD-10-CM

## 2013-09-23 DIAGNOSIS — F341 Dysthymic disorder: Secondary | ICD-10-CM

## 2013-09-23 LAB — COMPREHENSIVE METABOLIC PANEL (CC13)
ALBUMIN: 4.3 g/dL (ref 3.5–5.0)
ALT: 23 U/L (ref 0–55)
AST: 24 U/L (ref 5–34)
Alkaline Phosphatase: 69 U/L (ref 40–150)
Anion Gap: 9 mEq/L (ref 3–11)
BUN: 14.5 mg/dL (ref 7.0–26.0)
CHLORIDE: 106 meq/L (ref 98–109)
CO2: 26 mEq/L (ref 22–29)
Calcium: 9.4 mg/dL (ref 8.4–10.4)
Creatinine: 0.8 mg/dL (ref 0.6–1.1)
Glucose: 104 mg/dl (ref 70–140)
POTASSIUM: 4.2 meq/L (ref 3.5–5.1)
SODIUM: 141 meq/L (ref 136–145)
Total Bilirubin: 2.04 mg/dL — ABNORMAL HIGH (ref 0.20–1.20)
Total Protein: 6.1 g/dL — ABNORMAL LOW (ref 6.4–8.3)

## 2013-09-23 LAB — CBC WITH DIFFERENTIAL/PLATELET
BASO%: 0.2 % (ref 0.0–2.0)
Basophils Absolute: 0 10*3/uL (ref 0.0–0.1)
EOS%: 2.4 % (ref 0.0–7.0)
Eosinophils Absolute: 0.2 10*3/uL (ref 0.0–0.5)
HCT: 24.8 % — ABNORMAL LOW (ref 34.8–46.6)
HGB: 7.8 g/dL — ABNORMAL LOW (ref 11.6–15.9)
LYMPH%: 25.4 % (ref 14.0–49.7)
MCH: 32.1 pg (ref 25.1–34.0)
MCHC: 31.4 g/dL — AB (ref 31.5–36.0)
MCV: 102.2 fL — AB (ref 79.5–101.0)
MONO#: 0.6 10*3/uL (ref 0.1–0.9)
MONO%: 6.2 % (ref 0.0–14.0)
NEUT%: 65.8 % (ref 38.4–76.8)
NEUTROS ABS: 6.7 10*3/uL — AB (ref 1.5–6.5)
PLATELETS: 426 10*3/uL — AB (ref 145–400)
RBC: 2.43 10*6/uL — AB (ref 3.70–5.45)
RDW: 26.8 % — ABNORMAL HIGH (ref 11.2–14.5)
WBC: 10.2 10*3/uL (ref 3.9–10.3)
lymph#: 2.6 10*3/uL (ref 0.9–3.3)

## 2013-09-23 LAB — LACTATE DEHYDROGENASE (CC13): LDH: 168 U/L (ref 125–245)

## 2013-09-23 NOTE — Telephone Encounter (Signed)
gv and printed appt sched and avs forpt for March thru may////pt already got [facility] letter

## 2013-09-24 LAB — HOLD TUBE, BLOOD BANK

## 2013-09-24 NOTE — Progress Notes (Signed)
Hematology and Oncology Follow Up Visit  Carolyn Brennan [id number] 01-07-1941 73 y.o. 09/24/2013 8:14 AM   Principle Diagnosis: Encounter Diagnoses  Name Primary?  [name] MDS/MPN (myelodysplastic/myeloproliferative neoplasms) Yes  . Iron overload due to repeated red blood cell transfusions   . Hepatosplenomegaly on CT abdomen 11/17/12   . Anemia      Interim History:  Follow up visit for this 73 year old woman with a myeloproliferative disorder with dysplastic features. She has become transfusion-dependent for red blood cells. She is requiring blood every 3-4 weeks. She failed a trial of Aranesp and Procrit. Blood counts as far back as July 2007 showed a macrocytic anemia with hemoglobin 11 g, leukocytosis with white count 13,600, and mild elevation of her platelet count of 399,000. She was referred to our office in April 2008. Initial bone marrow done by my partner Dr. Marin Olp showed a hypercellular marrow with ring sideroblasts. Routine cytogenetics were normal. FISH studies not done on the marrow. She tested negative for the JAK-2 mutation. Most recent bone marrow done 01/25/2011 shows normal cytogenetics. No evidence for the 5Q deletion on FISH. This marrow changed little compared with the initial study done at diagnosis in April of 2008. Marrow hypercellular for age, 90%, with prominent erythroid proliferation, megakaryocyte proliferation with the large and abnormal forms. No excess blasts. Normal maturation in the myeloid series. dysplastic changes in the erythroid series. She has developed iron overload from repetitive transfusions.  . Ferritin levels are climbing. After much discussion and deliberation, she agreed to a trial of Exjade started in September 2014. Peak ferritin almost 5000 in May 2014 but this correlated with an acute infectious process-staphylococcal aureus bacteremia. A more reliable baseline obtained 05/13/2013 was 2236 units. Overall she is tolerating the drug well although she  calls it  her "ugly pill". Ferritin levels slowly improving with most recent value 2657 on 09/15/2013 down from 3112 on 07/22/2013. She did get an interim I exam . Renal function has been stable.  She has had no new medical problems. Only complaint is persistent fatigue. No interim infections.    Medications: reviewed  Allergies: No Known Allergies  Review of Systems: Hematology: No bleeding or bruising   ENT ROS: No sore throat Breast ROS:  Respiratory ROS: No cough or dyspnea Cardiovascular ROS:  No chest pain or palpitations Gastrointestinal ROS:   No abdominal pain no change in bowel habit Genito-Urinary ROS: No urinary tract symptoms Musculoskeletal ROS: No muscle bone or joint pain Neurological ROS: No headache or change in vision Dermatological ROS: No rash or ecchymosis Remaining ROS negative:   Physical Exam: Blood pressure 134/49, pulse 86, temperature 97.8 F (36.6 C), temperature source Oral, resp. rate 18, height 5\' 2"  (1.575 m), weight 170 lb 9.6 oz (77.384 kg), SpO2 98.00%. Wt Readings from Last 3 Encounters:  09/23/13 170 lb 9.6 oz (77.384 kg)  07/21/13 167 lb 12.8 oz (76.114 kg)  05/12/13 168 lb (76.204 kg)     General appearance: Well-nourished Caucasian woman HENNT: Pharynx no erythema, exudate, mass, or ulcer. No thyromegaly or thyroid nodules Lymph nodes: No cervical, supraclavicular, or axillary lymphadenopathy Breasts: Lungs: Clear to auscultation, resonant to percussion throughout Heart: Regular rhythm, no murmur, no gallop, no rub, no click, no edema Abdomen: Soft, nontender, normal bowel sounds, difficult  abdomen to examine firm in the left upper quadrant and right upper quadrants consistent with known hepatosplenomegaly but liver edge or spleen edge not easily palpable. Extremities: No edema, no calf tenderness Musculoskeletal: no joint deformities GU:  Vascular: Carotid pulses 2+, no bruits,  Neurologic: Alert, oriented, , cranial nerves grossly  normal, motor strength 5 over 5, reflexes 1+ symmetric, upper body coordination normal, gait normal, Skin: No rash or ecchymosis  Lab Results: CBC W/Diff    Component Value Date/Time   WBC 10.2 09/23/2013 1505   WBC 9.9 01/01/2013 1145   WBC 16.6* 04/16/2012 1858   RBC 2.43* 09/23/2013 1505   RBC 2.49* 01/01/2013 1145   RBC 2.80* 04/16/2012 1858   HGB 7.8* 09/23/2013 1505   HGB 8.1* 09/18/2013 0827   HGB 8.4* 04/16/2012 1858   HCT 24.8* 09/23/2013 1505   HCT 25.5* 09/18/2013 0827   HCT 27.2* 04/16/2012 1858   PLT 426* 09/23/2013 1505   PLT 497* 01/01/2013 1145   MCV 102.2* 09/23/2013 1505   MCV 90.8 01/01/2013 1145   MCV 97.1* 04/16/2012 1858   MCH 32.1 09/23/2013 1505   MCH 30.5 01/01/2013 1145   MCH 30.0 04/16/2012 1858   MCHC 31.4* 09/23/2013 1505   MCHC 33.6 01/01/2013 1145   MCHC 30.9* 04/16/2012 1858   RDW 26.8* 09/23/2013 1505   RDW 22.5* 01/01/2013 1145   LYMPHSABS 2.6 09/23/2013 1505   LYMPHSABS 0.6* 11/16/2012 2215   MONOABS 0.6 09/23/2013 1505   MONOABS 0.3 11/16/2012 2215   EOSABS 0.2 09/23/2013 1505   EOSABS 0.0 11/16/2012 2215   BASOSABS 0.0 09/23/2013 1505   BASOSABS 0.0 11/16/2012 2215     Chemistry      Component Value Date/Time   NA 141 09/23/2013 1504   NA 137 07/21/2013 1550   K 4.2 09/23/2013 1504   K 4.4 07/21/2013 1550   CL 99 07/21/2013 1550   CL 103 09/01/2012 1453   CO2 26 09/23/2013 1504   CO2 24 07/21/2013 1550   BUN 14.5 09/23/2013 1504   BUN 23 07/21/2013 1550   CREATININE 0.8 09/23/2013 1504   CREATININE 0.76 07/21/2013 1550      Component Value Date/Time   CALCIUM 9.4 09/23/2013 1504   CALCIUM 9.8 07/21/2013 1550   ALKPHOS 69 09/23/2013 1504   ALKPHOS 75 07/21/2013 1550   AST 24 09/23/2013 1504   AST 35 07/21/2013 1550   ALT 23 09/23/2013 1504   ALT 30 07/21/2013 1550   BILITOT 2.04* 09/23/2013 1504   BILITOT 1.8* 07/21/2013 1550      Impression:   #1. Myeloproliferative disorder with dysplastic features.   #2. 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   #3. Iron overload  from poly transfusion  Continue Exjade.   #4. Idiopathic Staphylococcus aureus sepsis. May 2014   #5. Status post Mohs surgery to excise a basal cell carcinoma bridge of nose   #6. Hepatosplenomegaly secondary to her underlying myeloproliferative disorder   #7. Chronic anxiety and depression  If possible she would prefer to continue her lab work and transfusions at the [facility] and have her followup M.D. visits with me at my [facility] hematology clinic.   CC: Patient Care Team: Marjorie Smolder, MD as PCP - General (Family Medicine) Annia Belt, MD as Consulting Physician (Hematology and Oncology)   Annia Belt, MD 3/12/20158:14 AM

## 2013-09-27 ENCOUNTER — Emergency Department (HOSPITAL_COMMUNITY)
Admission: EM | Admit: 2013-09-27 | Discharge: 2013-09-27 | Disposition: A | Discharge status: Home / Self Care | Payer: Medicare Other | Attending: Emergency Medicine | Admitting: Emergency Medicine

## 2013-09-27 ENCOUNTER — Encounter (HOSPITAL_COMMUNITY): Payer: Self-pay | Admitting: Emergency Medicine

## 2013-09-27 DIAGNOSIS — D47Z9 Other specified neoplasms of uncertain behavior of lymphoid, hematopoietic and related tissue: Secondary | ICD-10-CM | POA: Insufficient documentation

## 2013-09-27 DIAGNOSIS — Z87891 Personal history of nicotine dependence: Secondary | ICD-10-CM | POA: Insufficient documentation

## 2013-09-27 DIAGNOSIS — R040 Epistaxis: Secondary | ICD-10-CM | POA: Insufficient documentation

## 2013-09-27 DIAGNOSIS — F341 Dysthymic disorder: Secondary | ICD-10-CM | POA: Insufficient documentation

## 2013-09-27 LAB — CBC
HCT: 25.5 % — ABNORMAL LOW (ref 36.0–46.0)
Hemoglobin: 8 g/dL — ABNORMAL LOW (ref 12.0–15.0)
MCH: 33.2 pg (ref 26.0–34.0)
MCHC: 31.4 g/dL (ref 30.0–36.0)
MCV: 105.8 fL — ABNORMAL HIGH (ref 78.0–100.0)
Platelets: 410 10*3/uL — ABNORMAL HIGH (ref 150–400)
RBC: 2.41 MIL/uL — ABNORMAL LOW (ref 3.87–5.11)
RDW: 25.5 % — AB (ref 11.5–15.5)
WBC: 10.3 10*3/uL (ref 4.0–10.5)

## 2013-09-27 MED ORDER — LIDOCAINE HCL 4 % EX SOLN
Freq: Once | CUTANEOUS | Status: AC
Start: 2013-09-27 — End: 2013-09-27
  Administered 2013-09-27: 13:00:00 via TOPICAL
  Filled 2013-09-27: qty 50

## 2013-09-27 MED ORDER — OXYMETAZOLINE HCL 0.05 % NA SOLN
1.0000 | Freq: Once | NASAL | Status: AC
  Administered 2013-09-27: 1 via NASAL
  Filled 2013-09-27: qty 15

## 2013-09-27 MED ORDER — SILVER NITRATE-POT NITRATE 75-25 % EX MISC
1.0000 | Freq: Once | CUTANEOUS | Status: AC
  Administered 2013-09-27: 1 via TOPICAL
  Filled 2013-09-27: qty 1

## 2013-09-27 NOTE — ED Notes (Signed)
Pt was seen here for same recently, last week seen by her ENT, had it cauterized, and started bleeding again around 3 this morning. Pt also sts she is concerned about her blood work and hemoglobin levels

## 2013-09-27 NOTE — ED Notes (Signed)
She c/o painless, left-sided epistaxis.  She further tells [place] she has myelodysplastic synd. And has had this occur several times before.  She is chipper, and in no distress.

## 2013-09-27 NOTE — Discharge Instructions (Signed)

## 2013-09-27 NOTE — ED Provider Notes (Signed)
CSN: [id number]     Arrival date & time 09/27/13  1145 History   First MD Initiated Contact with Patient 09/27/13 1218     Chief Complaint  Patient presents with  . Epistaxis     (Consider location/radiation/quality/duration/timing/severity/associated sxs/prior Treatment) Patient is a 73 y.o. female presenting with nosebleeds. The history is provided by the patient.  Epistaxis Associated symptoms: no cough and no fever    patient presents with a left-sided nosebleed. She was seen in the ER 9 days ago with a similar bleed. At that time she had cauterization followed by packing. She followed up with Dr. Benjamine Mola 3 days later that the pack removed, along with more cauterization. She states she's been doing well with minimal bleeding. She had increased bleeding today. She has a history of myelodysplastic syndrome. She's a history also of iron overload. No lightheadedness or dizziness. No other bleeding. She states she has an appointment to see ENT tomorrow at 1:30.  Past Medical History  Diagnosis Date  . 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 Anxiety and depression 08/12/2011  . Anemia   . Iron overload due to repeated red blood cell transfusions 07/21/2013   Past Surgical History  Procedure Laterality Date  . Neck plastic surgery    . Abdominoplasty/panniculectomy    . Tubal ligation    . Breast reduction surgery    . Appendectomy    . Tee without cardioversion N/A 11/19/2012    Procedure: TRANSESOPHAGEAL ECHOCARDIOGRAM (TEE);  Surgeon: Jettie Booze, MD;  Location: [facility];  Service: Cardiovascular;  Laterality: N/A;   Family History  Problem Relation Age of Onset  . Breast cancer Mother   . Colon cancer Father 82   History  Substance Use Topics  . Smoking status: Former Smoker    Start date: 04/16/1992  . Smokeless tobacco: Not on file  . Alcohol Use: No   OB History   Grav Para Term Preterm Abortions TAB SAB Ect Mult Living   3 0        0      Review of Systems  Constitutional: Negative for fever and appetite change.  HENT: Positive for nosebleeds.   Respiratory: Negative for cough and shortness of breath.   Cardiovascular: Negative for chest pain.  Skin: Negative for rash and wound.  Neurological: Negative for light-headedness.  Hematological: Negative for adenopathy. Does not bruise/bleed easily.      Allergies  Review of patient's allergies indicates no known allergies.  Home Medications   Current Outpatient Rx  Name  Route  Sig  Dispense  Refill  . aspirin EC 81 MG tablet   Oral   Take 81 mg by mouth daily.         . cholecalciferol (VITAMIN D) 1000 UNITS tablet   Oral   Take 1,000 Units by mouth daily.         [name] deferasirox (EXJADE) 500 MG disintegrating tablet   Oral   Take 1,500 mg by mouth daily before breakfast.         . ergocalciferol (VITAMIN D2) 50000 UNITS capsule   Oral   Take 100,000 Units by mouth 2 (two) times a week. On mondays and fridays         . FLUoxetine (PROZAC) 40 MG capsule   Oral   Take 40 mg by mouth every morning.          [name] HYDROcodone-acetaminophen (NORCO/VICODIN) 5-325 MG per tablet   Oral   Take 1 tablet by mouth  every 6 (six) hours as needed for moderate pain.          [name] LORazepam (ATIVAN) 0.5 MG tablet   Oral   Take 1 tablet (0.5 mg total) by mouth daily as needed for anxiety or sleep. Or nausea.   30 tablet   3   . OVER THE COUNTER MEDICATION   Nasal   Place 1-2 application into the nose 2 (two) times daily as needed. Jello Nasal (seasame oil spray that moisturizes the nasal passages)         . cephALEXin (KEFLEX) 500 MG capsule   Oral   Take 1 capsule (500 mg total) by mouth 4 (four) times daily.   12 capsule   0   . lidocaine-prilocaine (EMLA) cream   Topical   Apply topically as needed. Apply over port area 1-2 hours before treatment and cover with plastic wrap   30 g   prn    BP 128/77  Pulse 78  Temp(Src) 98.2 F (36.8 C)  (Oral)  Resp 16  SpO2 99% Physical Exam  Constitutional: She appears well-developed and well-nourished.  HENT:  Blood in left and there was some active bleeding. It is on the inferior floor posterior to the maxillary nasal spine   Eyes: Pupils are equal, round, and reactive to light.  Cardiovascular: Normal rate and regular rhythm.   Pulmonary/Chest: Effort normal and breath sounds normal.  Neurological: She is alert.    ED Course  EPISTAXIS MANAGEMENT Date/Time: 09/27/2013 2:48 PM Performed by: Davonna Belling R. Authorized by: Mackie Pai Consent: Verbal consent obtained. written consent not obtained. Risks and benefits: risks, benefits and alternatives were discussed Consent given by: patient Patient understanding: patient states understanding of the procedure being performed Patient identity confirmed: verbally with patient and arm band Local anesthetic: topical anesthetic Patient sedated: no Treatment site: left anterior Repair method: silver nitrate Post-procedure assessment: bleeding decreased Treatment complexity: simple Patient tolerance: Patient tolerated the procedure well with no immediate complications.   (including critical care time) Labs Review Labs Reviewed  CBC - Abnormal; Notable for the following:    RBC 2.41 (*)    Hemoglobin 8.0 (*)    HCT 25.5 (*)    MCV 105.8 (*)    RDW 25.5 (*)    Platelets 410 (*)    All other components within normal limits   Imaging Review No results found.   EKG Interpretation None      MDM   Final diagnoses:  Epistaxis    Patient with recurrent epistaxis. Hemoglobin is stable. There was an area of bleeding, but was cauterized. There is still some mild oozing, however patient was not eager to have another packing. She has followup in 24 hours with ENT. Will discharge. Patient's bleed is somewhat posterior, however not a posterior bleed. It likely be difficult to old compression in this area.   Jasper Riling. Alvino Chapel, MD 09/27/13 [phone]

## 2013-09-28 ENCOUNTER — Ambulatory Visit: Payer: Medicare Other | Admitting: Oncology

## 2013-10-07 ENCOUNTER — Other Ambulatory Visit (HOSPITAL_BASED_OUTPATIENT_CLINIC_OR_DEPARTMENT_OTHER): Payer: Medicare Other

## 2013-10-07 DIAGNOSIS — D47Z9 Other specified neoplasms of uncertain behavior of lymphoid, hematopoietic and related tissue: Secondary | ICD-10-CM

## 2013-10-07 DIAGNOSIS — D469 Myelodysplastic syndrome, unspecified: Secondary | ICD-10-CM

## 2013-10-07 DIAGNOSIS — D649 Anemia, unspecified: Secondary | ICD-10-CM

## 2013-10-07 DIAGNOSIS — R162 Hepatomegaly with splenomegaly, not elsewhere classified: Secondary | ICD-10-CM

## 2013-10-07 LAB — CBC WITH DIFFERENTIAL/PLATELET
BASO%: 0.4 % (ref 0.0–2.0)
Basophils Absolute: 0 10*3/uL (ref 0.0–0.1)
EOS ABS: 0.2 10*3/uL (ref 0.0–0.5)
EOS%: 2.3 % (ref 0.0–7.0)
HCT: 24.8 % — ABNORMAL LOW (ref 34.8–46.6)
HGB: 8 g/dL — ABNORMAL LOW (ref 11.6–15.9)
LYMPH%: 28.5 % (ref 14.0–49.7)
MCH: 33.7 pg (ref 25.1–34.0)
MCHC: 32.1 g/dL (ref 31.5–36.0)
MCV: 105 fL — AB (ref 79.5–101.0)
MONO#: 0.6 10*3/uL (ref 0.1–0.9)
MONO%: 5.8 % (ref 0.0–14.0)
NEUT#: 6.1 10*3/uL (ref 1.5–6.5)
NEUT%: 63 % (ref 38.4–76.8)
PLATELETS: 418 10*3/uL — AB (ref 145–400)
RBC: 2.36 10*6/uL — AB (ref 3.70–5.45)
RDW: 27.7 % — AB (ref 11.2–14.5)
WBC: 9.7 10*3/uL (ref 3.9–10.3)
lymph#: 2.8 10*3/uL (ref 0.9–3.3)

## 2013-10-07 LAB — HOLD TUBE, BLOOD BANK

## 2013-10-14 ENCOUNTER — Ambulatory Visit
Admission: RE | Admit: 2013-10-14 | Discharge: 2013-10-14 | Disposition: A | Discharge status: Home / Self Care | Payer: Medicare Other | Source: Ambulatory Visit | Attending: Family Medicine | Admitting: Family Medicine

## 2013-10-14 ENCOUNTER — Other Ambulatory Visit: Payer: Self-pay | Admitting: Oncology

## 2013-10-14 ENCOUNTER — Other Ambulatory Visit: Payer: Self-pay | Admitting: Family Medicine

## 2013-10-14 DIAGNOSIS — R059 Cough, unspecified: Secondary | ICD-10-CM

## 2013-10-14 DIAGNOSIS — R05 Cough: Secondary | ICD-10-CM

## 2013-10-21 ENCOUNTER — Other Ambulatory Visit (HOSPITAL_BASED_OUTPATIENT_CLINIC_OR_DEPARTMENT_OTHER): Payer: Medicare Other

## 2013-10-21 ENCOUNTER — Telehealth: Payer: Self-pay | Admitting: *Deleted

## 2013-10-21 DIAGNOSIS — D47Z9 Other specified neoplasms of uncertain behavior of lymphoid, hematopoietic and related tissue: Secondary | ICD-10-CM

## 2013-10-21 DIAGNOSIS — R7989 Other specified abnormal findings of blood chemistry: Secondary | ICD-10-CM

## 2013-10-21 DIAGNOSIS — R945 Abnormal results of liver function studies: Secondary | ICD-10-CM

## 2013-10-21 DIAGNOSIS — R162 Hepatomegaly with splenomegaly, not elsewhere classified: Secondary | ICD-10-CM

## 2013-10-21 DIAGNOSIS — D469 Myelodysplastic syndrome, unspecified: Secondary | ICD-10-CM

## 2013-10-21 DIAGNOSIS — D649 Anemia, unspecified: Secondary | ICD-10-CM

## 2013-10-21 LAB — COMPREHENSIVE METABOLIC PANEL (CC13)
ALK PHOS: 69 U/L (ref 40–150)
ALT: 16 U/L (ref 0–55)
ANION GAP: 9 meq/L (ref 3–11)
AST: 22 U/L (ref 5–34)
Albumin: 4.6 g/dL (ref 3.5–5.0)
BUN: 19.9 mg/dL (ref 7.0–26.0)
CO2: 25 meq/L (ref 22–29)
CREATININE: 0.9 mg/dL (ref 0.6–1.1)
Calcium: 9.3 mg/dL (ref 8.4–10.4)
Chloride: 105 mEq/L (ref 98–109)
GLUCOSE: 120 mg/dL (ref 70–140)
Potassium: 4.2 mEq/L (ref 3.5–5.1)
Sodium: 139 mEq/L (ref 136–145)
Total Bilirubin: 3.02 mg/dL — ABNORMAL HIGH (ref 0.20–1.20)
Total Protein: 6.6 g/dL (ref 6.4–8.3)

## 2013-10-21 LAB — CBC WITH DIFFERENTIAL/PLATELET
BASO%: 0.5 % (ref 0.0–2.0)
Basophils Absolute: 0 10*3/uL (ref 0.0–0.1)
EOS%: 2.7 % (ref 0.0–7.0)
Eosinophils Absolute: 0.2 10*3/uL (ref 0.0–0.5)
HEMATOCRIT: 25.1 % — AB (ref 34.8–46.6)
HGB: 8 g/dL — ABNORMAL LOW (ref 11.6–15.9)
LYMPH%: 31.5 % (ref 14.0–49.7)
MCH: 34.6 pg — ABNORMAL HIGH (ref 25.1–34.0)
MCHC: 31.9 g/dL (ref 31.5–36.0)
MCV: 108.4 fL — ABNORMAL HIGH (ref 79.5–101.0)
MONO#: 0.5 10*3/uL (ref 0.1–0.9)
MONO%: 5.3 % (ref 0.0–14.0)
NEUT%: 60 % (ref 38.4–76.8)
NEUTROS ABS: 5.1 10*3/uL (ref 1.5–6.5)
PLATELETS: 363 10*3/uL (ref 145–400)
RBC: 2.31 10*6/uL — ABNORMAL LOW (ref 3.70–5.45)
RDW: 27 % — ABNORMAL HIGH (ref 11.2–14.5)
WBC: 8.5 10*3/uL (ref 3.9–10.3)
lymph#: 2.7 10*3/uL (ref 0.9–3.3)

## 2013-10-21 LAB — HOLD TUBE, BLOOD BANK

## 2013-10-21 LAB — LACTATE DEHYDROGENASE (CC13): LDH: 194 U/L (ref 125–245)

## 2013-10-21 NOTE — Telephone Encounter (Signed)
Talked with pt & she is feeling OK, no SOB, chest pain or swelling.  Cancelled Type & hold.

## 2013-10-22 ENCOUNTER — Other Ambulatory Visit: Payer: Self-pay | Admitting: *Deleted

## 2013-10-22 NOTE — Progress Notes (Signed)
Patient called wondering where to have labs for 12/15/13 visit with Dr. Beryle Beams.  Per Dr. Darnell Level - probably better to have them here.  Called patient and left message.  Put POF in for lab appt.

## 2013-10-26 ENCOUNTER — Ambulatory Visit (HOSPITAL_COMMUNITY)
Admission: RE | Admit: 2013-10-26 | Discharge: 2013-10-26 | Disposition: A | Discharge status: Home / Self Care | Payer: Medicare Other | Source: Ambulatory Visit | Attending: Oncology | Admitting: Oncology

## 2013-10-26 ENCOUNTER — Other Ambulatory Visit: Payer: Medicare Other

## 2013-10-26 ENCOUNTER — Other Ambulatory Visit: Payer: Self-pay | Admitting: *Deleted

## 2013-10-26 DIAGNOSIS — D649 Anemia, unspecified: Secondary | ICD-10-CM | POA: Insufficient documentation

## 2013-10-27 ENCOUNTER — Telehealth: Payer: Self-pay | Admitting: *Deleted

## 2013-10-27 ENCOUNTER — Ambulatory Visit (HOSPITAL_BASED_OUTPATIENT_CLINIC_OR_DEPARTMENT_OTHER): Payer: Medicare Other

## 2013-10-27 ENCOUNTER — Other Ambulatory Visit: Payer: Self-pay | Admitting: *Deleted

## 2013-10-27 VITALS — BP 113/57 | HR 77 | Temp 97.7°F | Resp 20

## 2013-10-27 DIAGNOSIS — D649 Anemia, unspecified: Secondary | ICD-10-CM

## 2013-10-27 DIAGNOSIS — R0602 Shortness of breath: Secondary | ICD-10-CM

## 2013-10-27 DIAGNOSIS — R5383 Other fatigue: Secondary | ICD-10-CM

## 2013-10-27 DIAGNOSIS — R5381 Other malaise: Secondary | ICD-10-CM

## 2013-10-27 DIAGNOSIS — D47Z9 Other specified neoplasms of uncertain behavior of lymphoid, hematopoietic and related tissue: Secondary | ICD-10-CM

## 2013-10-27 LAB — CBC WITH DIFFERENTIAL/PLATELET
BASO%: 0.4 % (ref 0.0–2.0)
BASOS ABS: 0 10*3/uL (ref 0.0–0.1)
EOS ABS: 0.2 10*3/uL (ref 0.0–0.5)
EOS%: 2.2 % (ref 0.0–7.0)
HCT: 23.9 % — ABNORMAL LOW (ref 34.8–46.6)
HEMOGLOBIN: 7.5 g/dL — AB (ref 11.6–15.9)
LYMPH%: 30.7 % (ref 14.0–49.7)
MCH: 34.6 pg — ABNORMAL HIGH (ref 25.1–34.0)
MCHC: 31.4 g/dL — ABNORMAL LOW (ref 31.5–36.0)
MCV: 110.2 fL — AB (ref 79.5–101.0)
MONO#: 0.6 10*3/uL (ref 0.1–0.9)
MONO%: 6.8 % (ref 0.0–14.0)
NEUT#: 5 10*3/uL (ref 1.5–6.5)
NEUT%: 59.9 % (ref 38.4–76.8)
PLATELETS: 378 10*3/uL (ref 145–400)
RBC: 2.17 10*6/uL — ABNORMAL LOW (ref 3.70–5.45)
RDW: 26.3 % — ABNORMAL HIGH (ref 11.2–14.5)
WBC: 8.3 10*3/uL (ref 3.9–10.3)
lymph#: 2.5 10*3/uL (ref 0.9–3.3)

## 2013-10-27 MED ORDER — SODIUM CHLORIDE 0.9 % IV SOLN
250.0000 mL | Freq: Once | INTRAVENOUS | Status: AC
  Administered 2013-10-27: 250 mL via INTRAVENOUS

## 2013-10-27 MED ORDER — SODIUM CHLORIDE 0.9 % IJ SOLN
10.0000 mL | INTRAMUSCULAR | Status: AC | PRN
  Administered 2013-10-27: 10 mL
  Filled 2013-10-27: qty 10

## 2013-10-27 MED ORDER — LORAZEPAM 2 MG/ML IJ SOLN
1.0000 mg | Freq: Once | INTRAMUSCULAR | Status: AC
  Administered 2013-10-27: 1 mg via INTRAVENOUS

## 2013-10-27 MED ORDER — LORAZEPAM 2 MG/ML IJ SOLN
INTRAMUSCULAR | Status: AC
  Filled 2013-10-27: qty 1

## 2013-10-27 MED ORDER — HEPARIN SOD (PORK) LOCK FLUSH 100 UNIT/ML IV SOLN
500.0000 [IU] | Freq: Every day | INTRAVENOUS | Status: AC | PRN
  Administered 2013-10-27: 500 [IU]
  Filled 2013-10-27: qty 5

## 2013-10-27 NOTE — Telephone Encounter (Signed)
Late Entry:  Pt called yesterday 10/26/13 reporting SOB and "I'm just wiped out; I need a blood transfusion to help me breath"  Per Dr. Beryle Beams; set pt up for labs 10/26/13 and blood transfusion 10/27/13.  Last Hgb 10/21/13 8.0.

## 2013-10-27 NOTE — Addendum Note (Signed)
Addended by: Domenic Schwab on: 10/27/2013 09:46 AM   Modules accepted: Orders

## 2013-10-27 NOTE — Progress Notes (Signed)
Patient here for 2 units of blood, reports shortness of breath and mild fatigue. CBC obtained from [facility] prior to blood transfusion. hgb-7.5. Patient denies bleeding or bruising. No questions or concerns at this visit. Infusion completed without difficulty

## 2013-10-27 NOTE — Patient Instructions (Signed)
Blood Transfusion Information TODAY YOU RECEIVED TWO UNITS OF BLOOD.   WHAT IS A BLOOD TRANSFUSION? A transfusion is the replacement of blood or some of its parts. Blood is made up of multiple cells which provide different functions.  Red blood cells carry oxygen and are used for blood loss replacement.  White blood cells fight against infection.  Platelets control bleeding.  Plasma helps clot blood.  Other blood products are available for specialized needs, such as hemophilia or other clotting disorders. BEFORE THE TRANSFUSION  Who gives blood for transfusions?   You may be able to donate blood to be used at a later date on yourself (autologous donation).  Relatives can be asked to donate blood. This is generally not any safer than if you have received blood from a stranger. The same precautions are taken to ensure safety when a relative's blood is donated.  Healthy volunteers who are fully evaluated to make sure their blood is safe. This is blood bank blood. Transfusion therapy is the safest it has ever been in the practice of medicine. Before blood is taken from a donor, a complete history is taken to make sure that person has no history of diseases nor engages in risky social behavior (examples are intravenous drug use or sexual activity with multiple partners). The donor's travel history is screened to minimize risk of transmitting infections, such as malaria. The donated blood is tested for signs of infectious diseases, such as HIV and hepatitis. The blood is then tested to be sure it is compatible with you in order to minimize the chance of a transfusion reaction. If you or a relative donates blood, this is often done in anticipation of surgery and is not appropriate for emergency situations. It takes many days to process the donated blood. RISKS AND COMPLICATIONS Although transfusion therapy is very safe and saves many lives, the main dangers of transfusion include:   Getting an  infectious disease.  Developing a transfusion reaction. This is an allergic reaction to something in the blood you were given. Every precaution is taken to prevent this. The decision to have a blood transfusion has been considered carefully by your caregiver before blood is given. Blood is not given unless the benefits outweigh the risks. AFTER THE TRANSFUSION  Right after receiving a blood transfusion, you will usually feel much better and more energetic. This is especially true if your red blood cells have gotten low (anemic). The transfusion raises the level of the red blood cells which carry oxygen, and this usually causes an energy increase.  The nurse administering the transfusion will monitor you carefully for complications. HOME CARE INSTRUCTIONS  No special instructions are needed after a transfusion. You may find your energy is better. Speak with your caregiver about any limitations on activity for underlying diseases you may have. SEEK MEDICAL CARE IF:   Your condition is not improving after your transfusion.  You develop redness or irritation at the intravenous (IV) site. SEEK IMMEDIATE MEDICAL CARE IF:  Any of the following symptoms occur over the next 12 hours:  Shaking chills.  You have a temperature by mouth above 102 F (38.9 C), not controlled by medicine.  Chest, back, or muscle pain.  People around you feel you are not acting correctly or are confused.  Shortness of breath or difficulty breathing.  Dizziness and fainting.  You get a rash or develop hives.  You have a decrease in urine output.  Your urine turns a dark color or changes  to pink, red, or brown. Any of the following symptoms occur over the next 10 days:  You have a temperature by mouth above 102 F (38.9 C), not controlled by medicine.  Shortness of breath.  Weakness after normal activity.  The white part of the eye turns yellow (jaundice).  You have a decrease in the amount of urine or  are urinating less often.  Your urine turns a dark color or changes to pink, red, or brown. Document Released: 06/29/2000 Document Revised: 09/24/2011 Document Reviewed: 02/16/2008 [facility] Patient Information 2014 [place].

## 2013-10-28 LAB — TYPE AND SCREEN
ABO/RH(D): A POS
ANTIBODY SCREEN: NEGATIVE
Unit division: 0
Unit division: 0

## 2013-11-04 ENCOUNTER — Other Ambulatory Visit: Payer: Medicare Other

## 2013-11-18 ENCOUNTER — Other Ambulatory Visit (HOSPITAL_BASED_OUTPATIENT_CLINIC_OR_DEPARTMENT_OTHER): Payer: Medicare Other

## 2013-11-18 ENCOUNTER — Telehealth: Payer: Self-pay | Admitting: *Deleted

## 2013-11-18 DIAGNOSIS — D469 Myelodysplastic syndrome, unspecified: Secondary | ICD-10-CM

## 2013-11-18 DIAGNOSIS — R945 Abnormal results of liver function studies: Secondary | ICD-10-CM

## 2013-11-18 DIAGNOSIS — R162 Hepatomegaly with splenomegaly, not elsewhere classified: Secondary | ICD-10-CM

## 2013-11-18 DIAGNOSIS — R7989 Other specified abnormal findings of blood chemistry: Secondary | ICD-10-CM

## 2013-11-18 DIAGNOSIS — D47Z9 Other specified neoplasms of uncertain behavior of lymphoid, hematopoietic and related tissue: Secondary | ICD-10-CM

## 2013-11-18 DIAGNOSIS — D649 Anemia, unspecified: Secondary | ICD-10-CM

## 2013-11-18 LAB — COMPREHENSIVE METABOLIC PANEL (CC13)
ALT: 19 U/L (ref 0–55)
AST: 22 U/L (ref 5–34)
Albumin: 4.4 g/dL (ref 3.5–5.0)
Alkaline Phosphatase: 69 U/L (ref 40–150)
Anion Gap: 8 mEq/L (ref 3–11)
BILIRUBIN TOTAL: 2.46 mg/dL — AB (ref 0.20–1.20)
BUN: 19.3 mg/dL (ref 7.0–26.0)
CO2: 26 mEq/L (ref 22–29)
CREATININE: 0.9 mg/dL (ref 0.6–1.1)
Calcium: 9.5 mg/dL (ref 8.4–10.4)
Chloride: 107 mEq/L (ref 98–109)
GLUCOSE: 95 mg/dL (ref 70–140)
POTASSIUM: 4.5 meq/L (ref 3.5–5.1)
Sodium: 141 mEq/L (ref 136–145)
Total Protein: 6.5 g/dL (ref 6.4–8.3)

## 2013-11-18 LAB — CBC WITH DIFFERENTIAL/PLATELET
BASO%: 1.2 % (ref 0.0–2.0)
BASOS ABS: 0.1 10*3/uL (ref 0.0–0.1)
EOS ABS: 0.2 10*3/uL (ref 0.0–0.5)
EOS%: 2.3 % (ref 0.0–7.0)
HEMATOCRIT: 27.4 % — AB (ref 34.8–46.6)
HEMOGLOBIN: 8.8 g/dL — AB (ref 11.6–15.9)
LYMPH%: 27.3 % (ref 14.0–49.7)
MCH: 33.6 pg (ref 25.1–34.0)
MCHC: 32.1 g/dL (ref 31.5–36.0)
MCV: 104.7 fL — AB (ref 79.5–101.0)
MONO#: 0.5 10*3/uL (ref 0.1–0.9)
MONO%: 5.9 % (ref 0.0–14.0)
NEUT#: 5.4 10*3/uL (ref 1.5–6.5)
NEUT%: 63.3 % (ref 38.4–76.8)
Platelets: 393 10*3/uL (ref 145–400)
RBC: 2.62 10*6/uL — AB (ref 3.70–5.45)
RDW: 26.2 % — ABNORMAL HIGH (ref 11.2–14.5)
WBC: 8.5 10*3/uL (ref 3.9–10.3)
lymph#: 2.3 10*3/uL (ref 0.9–3.3)

## 2013-11-18 LAB — LACTATE DEHYDROGENASE (CC13): LDH: 180 U/L (ref 125–245)

## 2013-11-18 LAB — HOLD TUBE, BLOOD BANK

## 2013-11-18 NOTE — Telephone Encounter (Signed)
Patient here for lab work and possible transfusion.  She is feeling tired today.    Hbg was 8.8.  Patient left as soon as she got her results. Spoke to patient at home.  She really felt fine yesterday and did a lot of activity, which may be why she is feeling more fatigue today.  She will leave her blue band on and call us if she is having any problems. Otherwise she will return in 2 weeks for additional labs.

## 2013-11-30 ENCOUNTER — Telehealth: Payer: Self-pay | Admitting: *Deleted

## 2013-11-30 ENCOUNTER — Telehealth: Payer: Self-pay | Admitting: Oncology

## 2013-11-30 ENCOUNTER — Other Ambulatory Visit: Payer: Self-pay | Admitting: *Deleted

## 2013-11-30 DIAGNOSIS — D469 Myelodysplastic syndrome, unspecified: Secondary | ICD-10-CM

## 2013-11-30 DIAGNOSIS — D649 Anemia, unspecified: Secondary | ICD-10-CM

## 2013-11-30 NOTE — Telephone Encounter (Signed)
Received call from pt & she would like to have her ferritin checked with lab appt 12/02/13 & she would also like to cont getting her labs her at [facility] although she will see Dr Beryle Beams 12/15/13.  Checked with Dr Beryle Beams & he is OK for ferritin this week & cbc 12/14/13 before MD visit 12/15/13.  POF done.

## 2013-11-30 NOTE — Telephone Encounter (Signed)
S/w the pt and she is aware of her lab appt on 12/14/2013,

## 2013-12-02 ENCOUNTER — Other Ambulatory Visit (HOSPITAL_BASED_OUTPATIENT_CLINIC_OR_DEPARTMENT_OTHER): Payer: Medicare Other

## 2013-12-02 DIAGNOSIS — D469 Myelodysplastic syndrome, unspecified: Secondary | ICD-10-CM

## 2013-12-02 DIAGNOSIS — D649 Anemia, unspecified: Secondary | ICD-10-CM

## 2013-12-02 DIAGNOSIS — R162 Hepatomegaly with splenomegaly, not elsewhere classified: Secondary | ICD-10-CM

## 2013-12-02 DIAGNOSIS — D47Z9 Other specified neoplasms of uncertain behavior of lymphoid, hematopoietic and related tissue: Secondary | ICD-10-CM

## 2013-12-02 LAB — CBC WITH DIFFERENTIAL/PLATELET
BASO%: 2.1 % — ABNORMAL HIGH (ref 0.0–2.0)
BASOS ABS: 0.2 10*3/uL — AB (ref 0.0–0.1)
EOS ABS: 0.2 10*3/uL (ref 0.0–0.5)
EOS%: 2.4 % (ref 0.0–7.0)
HCT: 25.4 % — ABNORMAL LOW (ref 34.8–46.6)
HEMOGLOBIN: 8.2 g/dL — AB (ref 11.6–15.9)
LYMPH%: 31.9 % (ref 14.0–49.7)
MCH: 34.5 pg — ABNORMAL HIGH (ref 25.1–34.0)
MCHC: 32 g/dL (ref 31.5–36.0)
MCV: 107.5 fL — AB (ref 79.5–101.0)
MONO#: 0.3 10*3/uL (ref 0.1–0.9)
MONO%: 3.8 % (ref 0.0–14.0)
NEUT%: 59.8 % (ref 38.4–76.8)
NEUTROS ABS: 4.7 10*3/uL (ref 1.5–6.5)
PLATELETS: 370 10*3/uL (ref 145–400)
RBC: 2.37 10*6/uL — ABNORMAL LOW (ref 3.70–5.45)
RDW: 26.6 % — ABNORMAL HIGH (ref 11.2–14.5)
WBC: 7.9 10*3/uL (ref 3.9–10.3)
lymph#: 2.5 10*3/uL (ref 0.9–3.3)

## 2013-12-02 LAB — FERRITIN CHCC: Ferritin: 1766 ng/ml — ABNORMAL HIGH (ref 9–269)

## 2013-12-02 LAB — HOLD TUBE, BLOOD BANK

## 2013-12-03 ENCOUNTER — Telehealth: Payer: Self-pay | Admitting: *Deleted

## 2013-12-03 NOTE — Telephone Encounter (Signed)
Message copied by Jesse Fall on Thu Dec 03, 2013  2:25 PM ------      Message from: Annia Belt      Created: Wed Dec 02, 2013  4:50 PM       Call pt: ferritin coming down nicely: 1,766 c/w 2,657 in March,  3,112 in Jan ------

## 2013-12-03 NOTE — Telephone Encounter (Signed)
Notified pt of decreasing ferritin per Dr Beryle Beams.  She states she is feeling much better & calls her medication to decrease ferritin her smart pill.  She states she will see Dr Beryle Beams 12/15/13.

## 2013-12-14 ENCOUNTER — Other Ambulatory Visit (HOSPITAL_BASED_OUTPATIENT_CLINIC_OR_DEPARTMENT_OTHER): Payer: Medicare Other

## 2013-12-14 DIAGNOSIS — D47Z9 Other specified neoplasms of uncertain behavior of lymphoid, hematopoietic and related tissue: Secondary | ICD-10-CM

## 2013-12-14 DIAGNOSIS — D649 Anemia, unspecified: Secondary | ICD-10-CM

## 2013-12-14 DIAGNOSIS — R945 Abnormal results of liver function studies: Secondary | ICD-10-CM

## 2013-12-14 DIAGNOSIS — R7989 Other specified abnormal findings of blood chemistry: Secondary | ICD-10-CM

## 2013-12-14 DIAGNOSIS — R162 Hepatomegaly with splenomegaly, not elsewhere classified: Secondary | ICD-10-CM

## 2013-12-14 DIAGNOSIS — D469 Myelodysplastic syndrome, unspecified: Secondary | ICD-10-CM

## 2013-12-14 LAB — COMPREHENSIVE METABOLIC PANEL (CC13)
ALBUMIN: 4.3 g/dL (ref 3.5–5.0)
ALK PHOS: 56 U/L (ref 40–150)
ALT: 22 U/L (ref 0–55)
AST: 23 U/L (ref 5–34)
Anion Gap: 12 mEq/L — ABNORMAL HIGH (ref 3–11)
BUN: 15.7 mg/dL (ref 7.0–26.0)
CO2: 23 mEq/L (ref 22–29)
Calcium: 9 mg/dL (ref 8.4–10.4)
Chloride: 106 mEq/L (ref 98–109)
Creatinine: 0.9 mg/dL (ref 0.6–1.1)
Glucose: 88 mg/dl (ref 70–140)
POTASSIUM: 4.5 meq/L (ref 3.5–5.1)
Sodium: 140 mEq/L (ref 136–145)
TOTAL PROTEIN: 6.2 g/dL — AB (ref 6.4–8.3)
Total Bilirubin: 2.58 mg/dL — ABNORMAL HIGH (ref 0.20–1.20)

## 2013-12-14 LAB — CBC WITH DIFFERENTIAL/PLATELET
BASO%: 1.1 % (ref 0.0–2.0)
Basophils Absolute: 0.1 10*3/uL (ref 0.0–0.1)
EOS%: 2 % (ref 0.0–7.0)
Eosinophils Absolute: 0.2 10*3/uL (ref 0.0–0.5)
HEMATOCRIT: 25.7 % — AB (ref 34.8–46.6)
HGB: 8 g/dL — ABNORMAL LOW (ref 11.6–15.9)
LYMPH#: 2.9 10*3/uL (ref 0.9–3.3)
LYMPH%: 36.6 % (ref 14.0–49.7)
MCH: 34.3 pg — AB (ref 25.1–34.0)
MCHC: 31.1 g/dL — ABNORMAL LOW (ref 31.5–36.0)
MCV: 110.3 fL — ABNORMAL HIGH (ref 79.5–101.0)
MONO#: 0.3 10*3/uL (ref 0.1–0.9)
MONO%: 4.2 % (ref 0.0–14.0)
NEUT#: 4.4 10*3/uL (ref 1.5–6.5)
NEUT%: 56.1 % (ref 38.4–76.8)
PLATELETS: 329 10*3/uL (ref 145–400)
RBC: 2.33 10*6/uL — ABNORMAL LOW (ref 3.70–5.45)
RDW: 25.7 % — ABNORMAL HIGH (ref 11.2–14.5)
WBC: 7.9 10*3/uL (ref 3.9–10.3)

## 2013-12-14 LAB — LACTATE DEHYDROGENASE (CC13): LDH: 211 U/L (ref 125–245)

## 2013-12-14 LAB — TECHNOLOGIST REVIEW

## 2013-12-15 ENCOUNTER — Ambulatory Visit (INDEPENDENT_AMBULATORY_CARE_PROVIDER_SITE_OTHER): Payer: Medicare Other | Admitting: Oncology

## 2013-12-15 ENCOUNTER — Encounter: Payer: Self-pay | Admitting: Oncology

## 2013-12-15 VITALS — BP 128/62 | HR 84 | Temp 97.0°F | Resp 20 | Ht 62.5 in | Wt 171.6 lb

## 2013-12-15 DIAGNOSIS — F411 Generalized anxiety disorder: Secondary | ICD-10-CM

## 2013-12-15 DIAGNOSIS — D469 Myelodysplastic syndrome, unspecified: Secondary | ICD-10-CM

## 2013-12-15 DIAGNOSIS — D649 Anemia, unspecified: Secondary | ICD-10-CM

## 2013-12-15 DIAGNOSIS — F341 Dysthymic disorder: Secondary | ICD-10-CM

## 2013-12-15 DIAGNOSIS — K7689 Other specified diseases of liver: Secondary | ICD-10-CM

## 2013-12-15 DIAGNOSIS — D630 Anemia in neoplastic disease: Secondary | ICD-10-CM

## 2013-12-15 DIAGNOSIS — D47Z9 Other specified neoplasms of uncertain behavior of lymphoid, hematopoietic and related tissue: Secondary | ICD-10-CM

## 2013-12-15 MED ORDER — LORAZEPAM 0.5 MG PO TABS: 0.5000 mg | ORAL_TABLET | Freq: Every day | ORAL | Status: DC | PRN

## 2013-12-15 NOTE — Patient Instructions (Signed)
We will change lab to every 3 weeks  Next 6/18 then change back to Wednesdays beginning 7/8 [facility] and transfusions at [facility] Follow up visit with Dr Darnell Level at [facility] in 4 months on October 6th @ 11:30 AM  30 minutes

## 2013-12-15 NOTE — Progress Notes (Signed)
Patient ID: Carolyn Brennan, female   DOB: 09/30/1940, 73 y.o.   MRN: [id number] Hematology and Oncology Follow Up Visit  Carolyn Brennan [id number] 25-Dec-1940 73 y.o. 12/15/2013 4:59 PM   Principle Diagnosis: Encounter Diagnoses  Name Primary?  [name] Anxiety state, unspecified Yes  . MDS/MPN (myelodysplastic/myeloproliferative neoplasms)   . Anemia   . Iron overload due to repeated red blood cell transfusions      Interim History:   Clinical summary:  73 year old woman with a myeloproliferative disorder with dysplastic features. She has become transfusion-dependent for red blood cells. She is requiring blood every 3-4 weeks. She failed a trial of Aranesp and Procrit. Blood counts as far back as July 2007 showed a macrocytic anemia with hemoglobin 11 g, leukocytosis with white count 13,600, and mild elevation of her platelet count of 399,000. She was referred to our office in April 2008. Initial bone marrow done by my partner Dr. Marin Olp showed a hypercellular marrow with ring sideroblasts. Routine cytogenetics were normal. FISH studies not done on the marrow. She tested negative for the JAK-2 mutation. Most recent bone marrow done 01/25/2011 shows normal cytogenetics. No evidence for the 5Q deletion on FISH. This marrow changed little compared with the initial study done at diagnosis in April of 2008. Marrow hypercellular for age, 90%, with prominent erythroid proliferation, megakaryocyte proliferation with the large and abnormal forms. No excess blasts. Normal maturation in the myeloid series. dysplastic changes in the erythroid series.  She has developed iron overload from repetitive transfusions.  . Ferritin levels were climbing. After much discussion and deliberation, she agreed to a trial of Exjade started in September 2014. Peak ferritin almost 5000 in May 2014 but this correlated with an acute infectious process-staphylococcal aureus bacteremia. A more reliable baseline obtained 05/13/2013 was  2236 units. Overall she is tolerating the drug well. Ferritin levels slowly improving with most recent value 1,766 on 12/02/2013. She had an eye exam prior to her visit here in March of this year.  Renal function has been stable.  She's had no problems since her visit here in March. She is in a much better frame of mind and she has been in years. She had planned to commit suicide on her 73th birthday which is next month. She has now reevaluated that decision and does not plan on harming herself. She brought me a periodical talking about death with dignity and we spent some time discussing this philosophy which I wholeheartedly agree with. She is very taking care of all of her funeral arrangements in the eventuality of her death. She has no first degree family members still alive but has cousins and close friends who she communicates with on a regular basis.   Medications: reviewed  Allergies: No Known Allergies  Review of Systems: Hematology:  No bleeding or bruising ENT ROS: No sore throat. She needs to have some more dental work done. Her current dentist wanted her to take 1500 mg of ampicillin and Cipro 500 mg prior to the procedure and then the usual maintenance dose of 500 mg every 6 hours for another 24-48 hours. I have no problem with this. Breast ROS:  Respiratory ROS: No cough or dyspnea Cardiovascular ROS: No chest pain or palpitations  Gastrointestinal ROS: No abdominal pain or change in bowel habit   Genito-Urinary ROS: No urinary tract symptoms Musculoskeletal ROS: No muscle bone or joint pain Neurological ROS: No headache or change in vision Dermatological ROS: No rash Remaining ROS negative:   Physical Exam:  Blood pressure 128/62, pulse 84, temperature 97 F (36.1 C), temperature source Oral, resp. rate 20, height 5' 2.5" (1.588 m), weight 171 lb 9.6 oz (77.837 kg), SpO2 98.00%. Wt Readings from Last 3 Encounters:  12/15/13 171 lb 9.6 oz (77.837 kg)  09/23/13 170 lb 9.6 oz  (77.384 kg)  07/21/13 167 lb 12.8 oz (76.114 kg)     General appearance: Well-nourished Caucasian woman HENNT: Pharynx no erythema, exudate, mass, or ulcer. No thyromegaly or thyroid nodules Lymph nodes: No cervical, supraclavicular, or axillary lymphadenopathy Breasts:  Lungs: Clear to auscultation, resonant to percussion throughout Heart: Regular rhythm, no murmur, no gallop, no rub, no click, no edema Abdomen: Soft, nontender, normal bowel sounds, no mass, chronic borderline hepatosplenomegaly unchanged from prior exams Extremities: No edema, no calf tenderness Musculoskeletal: no joint deformities GU:  Vascular: Carotid pulses 2+, no bruits, distal pulses: Port-A-Cath infusion device right subclavian position Neurologic: Alert, oriented, PERRLA, cranial nerves grossly normal, motor strength 5 over 5, reflexes 1+ symmetric, upper body coordination normal, gait normal, Skin: No rash or ecchymosis  Lab Results: CBC W/Diff    Component Value Date/Time   WBC 7.9 12/14/2013 1023   WBC 10.3 09/27/2013 1240   WBC 16.6* 04/16/2012 1858   RBC 2.33* 12/14/2013 1023   RBC 2.41* 09/27/2013 1240   RBC 2.80* 04/16/2012 1858   HGB 8.0* 12/14/2013 1023   HGB 8.0* 09/27/2013 1240   HGB 8.4* 04/16/2012 1858   HCT 25.7* 12/14/2013 1023   HCT 25.5* 09/27/2013 1240   HCT 27.2* 04/16/2012 1858   PLT 329 12/14/2013 1023   PLT 410* 09/27/2013 1240   MCV 110.3* 12/14/2013 1023   MCV 105.8* 09/27/2013 1240   MCV 97.1* 04/16/2012 1858   MCH 34.3* 12/14/2013 1023   MCH 33.2 09/27/2013 1240   MCH 30.0 04/16/2012 1858   MCHC 31.1* 12/14/2013 1023   MCHC 31.4 09/27/2013 1240   MCHC 30.9* 04/16/2012 1858   RDW 25.7* 12/14/2013 1023   RDW 25.5* 09/27/2013 1240   LYMPHSABS 2.9 12/14/2013 1023   LYMPHSABS 0.6* 11/16/2012 2215   MONOABS 0.3 12/14/2013 1023   MONOABS 0.3 11/16/2012 2215   EOSABS 0.2 12/14/2013 1023   EOSABS 0.0 11/16/2012 2215   BASOSABS 0.1 12/14/2013 1023   BASOSABS 0.0 11/16/2012 2215     Chemistry      Component Value  Date/Time   NA 140 12/14/2013 1023   NA 137 07/21/2013 1550   K 4.5 12/14/2013 1023   K 4.4 07/21/2013 1550   CL 99 07/21/2013 1550   CL 103 09/01/2012 1453   CO2 23 12/14/2013 1023   CO2 24 07/21/2013 1550   BUN 15.7 12/14/2013 1023   BUN 23 07/21/2013 1550   CREATININE 0.9 12/14/2013 1023   CREATININE 0.76 07/21/2013 1550      Component Value Date/Time   CALCIUM 9.0 12/14/2013 1023   CALCIUM 9.8 07/21/2013 1550   ALKPHOS 56 12/14/2013 1023   ALKPHOS 75 07/21/2013 1550   AST 23 12/14/2013 1023   AST 35 07/21/2013 1550   ALT 22 12/14/2013 1023   ALT 30 07/21/2013 1550   BILITOT 2.58* 12/14/2013 1023   BILITOT 1.8* 07/21/2013 1550       Impression:  #1. 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  #2. 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  #3. Iron overload from poly transfusion        Continue Exjade.  #4. Idiopathic Staphylococcus aureus sepsis. May 2014  #5. Status post Mohs surgery to  excise a basal cell carcinoma bridge of nose  #6. Hepatosplenomegaly secondary to her underlying myeloproliferative disorder  #7. Chronic anxiety and depression    CC: Patient Care Team: Marjorie Smolder, MD as PCP - General (Family Medicine) Annia Belt, MD as Consulting Physician (Hematology and Oncology)   Annia Belt, MD 6/2/20154:59 PM

## 2013-12-16 ENCOUNTER — Other Ambulatory Visit: Payer: Self-pay | Admitting: Oncology

## 2013-12-20 ENCOUNTER — Telehealth: Payer: Self-pay | Admitting: Oncology

## 2013-12-20 NOTE — Telephone Encounter (Signed)
added lb q3w per 6/3 pof from [place]. s/w pt re next appt for 6/18. pt also mychart active and will get new schedule 6/18.

## 2013-12-28 ENCOUNTER — Ambulatory Visit (HOSPITAL_COMMUNITY)
Admission: RE | Admit: 2013-12-28 | Discharge: 2013-12-28 | Disposition: A | Discharge status: Home / Self Care | Payer: Medicare Other | Source: Ambulatory Visit | Attending: Oncology | Admitting: Oncology

## 2013-12-28 ENCOUNTER — Other Ambulatory Visit: Payer: Self-pay | Admitting: Oncology

## 2013-12-28 ENCOUNTER — Ambulatory Visit (HOSPITAL_BASED_OUTPATIENT_CLINIC_OR_DEPARTMENT_OTHER): Payer: Medicare Other

## 2013-12-28 ENCOUNTER — Telehealth: Payer: Self-pay | Admitting: *Deleted

## 2013-12-28 DIAGNOSIS — D47Z9 Other specified neoplasms of uncertain behavior of lymphoid, hematopoietic and related tissue: Secondary | ICD-10-CM

## 2013-12-28 DIAGNOSIS — D469 Myelodysplastic syndrome, unspecified: Secondary | ICD-10-CM | POA: Insufficient documentation

## 2013-12-28 DIAGNOSIS — D649 Anemia, unspecified: Secondary | ICD-10-CM

## 2013-12-28 DIAGNOSIS — D630 Anemia in neoplastic disease: Secondary | ICD-10-CM

## 2013-12-28 LAB — CBC WITH DIFFERENTIAL/PLATELET
BASO%: 1.9 % (ref 0.0–2.0)
BASOS ABS: 0.1 10*3/uL (ref 0.0–0.1)
EOS%: 1.5 % (ref 0.0–7.0)
Eosinophils Absolute: 0.1 10*3/uL (ref 0.0–0.5)
HCT: 25 % — ABNORMAL LOW (ref 34.8–46.6)
HEMOGLOBIN: 7.9 g/dL — AB (ref 11.6–15.9)
LYMPH#: 2.1 10*3/uL (ref 0.9–3.3)
LYMPH%: 29.6 % (ref 14.0–49.7)
MCH: 35.4 pg — ABNORMAL HIGH (ref 25.1–34.0)
MCHC: 31.7 g/dL (ref 31.5–36.0)
MCV: 111.7 fL — AB (ref 79.5–101.0)
MONO#: 0.3 10*3/uL (ref 0.1–0.9)
MONO%: 4.2 % (ref 0.0–14.0)
NEUT#: 4.4 10*3/uL (ref 1.5–6.5)
NEUT%: 62.8 % (ref 38.4–76.8)
Platelets: 329 10*3/uL (ref 145–400)
RBC: 2.24 10*6/uL — ABNORMAL LOW (ref 3.70–5.45)
RDW: 24.8 % — ABNORMAL HIGH (ref 11.2–14.5)
WBC: 7 10*3/uL (ref 3.9–10.3)

## 2013-12-28 LAB — HOLD TUBE, BLOOD BANK

## 2013-12-28 NOTE — Telephone Encounter (Signed)
PT. IS WEAK AND TIRED. SHE FEELS HER BLOOD COUNT IS LOW. LAB WAS CHECKED. PT.'S HGB. IS 7.9. DR.GRANFORTUNA CALLED AND WILL PUT IN ORDERS FOR PT.'S TRANSFUSION. SHE WILL BE TRANSFUSED ON Wednesday,12/30/13, AT 10:00AM.

## 2013-12-29 LAB — PREPARE RBC (CROSSMATCH)

## 2013-12-30 ENCOUNTER — Ambulatory Visit (HOSPITAL_BASED_OUTPATIENT_CLINIC_OR_DEPARTMENT_OTHER): Payer: Medicare Other

## 2013-12-30 VITALS — BP 132/79 | HR 66 | Temp 97.9°F | Resp 18

## 2013-12-30 DIAGNOSIS — D469 Myelodysplastic syndrome, unspecified: Secondary | ICD-10-CM

## 2013-12-30 DIAGNOSIS — D649 Anemia, unspecified: Secondary | ICD-10-CM

## 2013-12-30 MED ORDER — HEPARIN SOD (PORK) LOCK FLUSH 100 UNIT/ML IV SOLN
500.0000 [IU] | Freq: Every day | INTRAVENOUS | Status: AC | PRN
  Administered 2013-12-30: 500 [IU]
  Filled 2013-12-30: qty 5

## 2013-12-30 MED ORDER — SODIUM CHLORIDE 0.9 % IJ SOLN
10.0000 mL | INTRAMUSCULAR | Status: AC | PRN
  Administered 2013-12-30: 10 mL
  Filled 2013-12-30: qty 10

## 2013-12-30 MED ORDER — LORAZEPAM 2 MG/ML IJ SOLN
1.0000 mg | Freq: Once | INTRAMUSCULAR | Status: AC
  Administered 2013-12-30: 1 mg via INTRAVENOUS

## 2013-12-30 MED ORDER — LORAZEPAM 2 MG/ML IJ SOLN
INTRAMUSCULAR | Status: AC
  Filled 2013-12-30: qty 1

## 2013-12-30 MED ORDER — SODIUM CHLORIDE 0.9 % IV SOLN
250.0000 mL | Freq: Once | INTRAVENOUS | Status: AC
  Administered 2013-12-30: 250 mL via INTRAVENOUS

## 2013-12-30 NOTE — Patient Instructions (Signed)

## 2013-12-31 ENCOUNTER — Other Ambulatory Visit: Payer: Medicare Other

## 2013-12-31 LAB — TYPE AND SCREEN
ABO/RH(D): A POS
Antibody Screen: NEGATIVE
UNIT DIVISION: 0
Unit division: 0

## 2014-01-20 ENCOUNTER — Other Ambulatory Visit (HOSPITAL_BASED_OUTPATIENT_CLINIC_OR_DEPARTMENT_OTHER): Payer: Medicare Other

## 2014-01-20 ENCOUNTER — Telehealth: Payer: Self-pay | Admitting: *Deleted

## 2014-01-20 DIAGNOSIS — D469 Myelodysplastic syndrome, unspecified: Secondary | ICD-10-CM

## 2014-01-20 DIAGNOSIS — D649 Anemia, unspecified: Secondary | ICD-10-CM

## 2014-01-20 DIAGNOSIS — F411 Generalized anxiety disorder: Secondary | ICD-10-CM

## 2014-01-20 LAB — CBC WITH DIFFERENTIAL/PLATELET
BASO%: 0.4 % (ref 0.0–2.0)
Basophils Absolute: 0 10*3/uL (ref 0.0–0.1)
EOS%: 1.5 % (ref 0.0–7.0)
Eosinophils Absolute: 0.1 10*3/uL (ref 0.0–0.5)
HEMATOCRIT: 26.2 % — AB (ref 34.8–46.6)
HGB: 8.3 g/dL — ABNORMAL LOW (ref 11.6–15.9)
LYMPH%: 33.6 % (ref 14.0–49.7)
MCH: 33.5 pg (ref 25.1–34.0)
MCHC: 31.7 g/dL (ref 31.5–36.0)
MCV: 105.9 fL — AB (ref 79.5–101.0)
MONO#: 0.3 10*3/uL (ref 0.1–0.9)
MONO%: 3.1 % (ref 0.0–14.0)
NEUT#: 5.4 10*3/uL (ref 1.5–6.5)
NEUT%: 61.4 % (ref 38.4–76.8)
Platelets: 349 10*3/uL (ref 145–400)
RBC: 2.48 10*6/uL — AB (ref 3.70–5.45)
RDW: 26 % — ABNORMAL HIGH (ref 11.2–14.5)
WBC: 8.9 10*3/uL (ref 3.9–10.3)
lymph#: 3 10*3/uL (ref 0.9–3.3)

## 2014-01-20 LAB — COMPREHENSIVE METABOLIC PANEL (CC13)
ALBUMIN: 4.4 g/dL (ref 3.5–5.0)
ALK PHOS: 58 U/L (ref 40–150)
ALT: 21 U/L (ref 0–55)
AST: 20 U/L (ref 5–34)
Anion Gap: 9 mEq/L (ref 3–11)
BUN: 17.7 mg/dL (ref 7.0–26.0)
CO2: 23 mEq/L (ref 22–29)
CREATININE: 0.9 mg/dL (ref 0.6–1.1)
Calcium: 9.2 mg/dL (ref 8.4–10.4)
Chloride: 106 mEq/L (ref 98–109)
GLUCOSE: 128 mg/dL (ref 70–140)
POTASSIUM: 4 meq/L (ref 3.5–5.1)
Sodium: 138 mEq/L (ref 136–145)
Total Bilirubin: 2.59 mg/dL — ABNORMAL HIGH (ref 0.20–1.20)
Total Protein: 6.3 g/dL — ABNORMAL LOW (ref 6.4–8.3)

## 2014-01-20 LAB — HOLD TUBE, BLOOD BANK

## 2014-01-20 LAB — FERRITIN CHCC: Ferritin: 1286 ng/ml — ABNORMAL HIGH (ref 9–269)

## 2014-01-20 NOTE — Telephone Encounter (Signed)
Call from [place] at [facility] - pt's Hgb is 8.3 today and pt has CBC w/Hold XM. Wants to know if she needs to send it to blood bank or to cancel the order? Text message sent to Dr Beryle Beams.  Talked to Dr Darnell Level - he will call and talk to Mariann Laster [phone])

## 2014-02-10 ENCOUNTER — Other Ambulatory Visit (HOSPITAL_BASED_OUTPATIENT_CLINIC_OR_DEPARTMENT_OTHER): Payer: Medicare Other

## 2014-02-10 ENCOUNTER — Telehealth: Payer: Self-pay | Admitting: *Deleted

## 2014-02-10 DIAGNOSIS — D649 Anemia, unspecified: Secondary | ICD-10-CM

## 2014-02-10 DIAGNOSIS — D47Z9 Other specified neoplasms of uncertain behavior of lymphoid, hematopoietic and related tissue: Secondary | ICD-10-CM

## 2014-02-10 DIAGNOSIS — D469 Myelodysplastic syndrome, unspecified: Secondary | ICD-10-CM

## 2014-02-10 DIAGNOSIS — D630 Anemia in neoplastic disease: Secondary | ICD-10-CM

## 2014-02-10 DIAGNOSIS — F411 Generalized anxiety disorder: Secondary | ICD-10-CM

## 2014-02-10 LAB — COMPREHENSIVE METABOLIC PANEL (CC13)
ALBUMIN: 4.4 g/dL (ref 3.5–5.0)
ALT: 14 U/L (ref 0–55)
AST: 20 U/L (ref 5–34)
Alkaline Phosphatase: 56 U/L (ref 40–150)
Anion Gap: 7 mEq/L (ref 3–11)
BUN: 23.9 mg/dL (ref 7.0–26.0)
CALCIUM: 9.3 mg/dL (ref 8.4–10.4)
CHLORIDE: 106 meq/L (ref 98–109)
CO2: 25 mEq/L (ref 22–29)
Creatinine: 0.9 mg/dL (ref 0.6–1.1)
GLUCOSE: 96 mg/dL (ref 70–140)
POTASSIUM: 4.5 meq/L (ref 3.5–5.1)
Sodium: 139 mEq/L (ref 136–145)
Total Bilirubin: 3.39 mg/dL — ABNORMAL HIGH (ref 0.20–1.20)
Total Protein: 6.4 g/dL (ref 6.4–8.3)

## 2014-02-10 LAB — CBC WITH DIFFERENTIAL/PLATELET
BASO%: 4.5 % — ABNORMAL HIGH (ref 0.0–2.0)
BASOS ABS: 0.3 10*3/uL — AB (ref 0.0–0.1)
EOS%: 1.4 % (ref 0.0–7.0)
Eosinophils Absolute: 0.1 10*3/uL (ref 0.0–0.5)
HCT: 25.6 % — ABNORMAL LOW (ref 34.8–46.6)
HEMOGLOBIN: 7.9 g/dL — AB (ref 11.6–15.9)
LYMPH#: 3.1 10*3/uL (ref 0.9–3.3)
LYMPH%: 41.8 % (ref 14.0–49.7)
MCH: 33.5 pg (ref 25.1–34.0)
MCHC: 30.9 g/dL — ABNORMAL LOW (ref 31.5–36.0)
MCV: 108.6 fL — ABNORMAL HIGH (ref 79.5–101.0)
MONO#: 0 10*3/uL — AB (ref 0.1–0.9)
MONO%: 0.3 % (ref 0.0–14.0)
NEUT#: 3.9 10*3/uL (ref 1.5–6.5)
NEUT%: 52 % (ref 38.4–76.8)
PLATELETS: 340 10*3/uL (ref 145–400)
RBC: 2.36 10*6/uL — ABNORMAL LOW (ref 3.70–5.45)
RDW: 25.8 % — ABNORMAL HIGH (ref 11.2–14.5)
WBC: 7.5 10*3/uL (ref 3.9–10.3)

## 2014-02-10 LAB — HOLD TUBE, BLOOD BANK

## 2014-02-10 NOTE — Telephone Encounter (Signed)
Received call from Mariann Laster ([phone]) at [facility] - pt's Hgb is 7.9; Dr Beryle Beams is not here; will send specimen to blood bank to hold.  I called pt - states she "feels great". States her last transfusion was June 17th. And her next CBC is Aug 19th. And she plans to go to the [facility] on the 20th. She wants to change CBC from the 19th to the 17th so if she needs a transfusion,she can have it done and still go to the [facility]. Her next office visit is 10/19 but lab appt is 10/21.  Thanks

## 2014-02-10 NOTE — Telephone Encounter (Signed)
No need to transfuse at this time

## 2014-02-16 ENCOUNTER — Telehealth: Payer: Self-pay | Admitting: *Deleted

## 2014-02-16 NOTE — Telephone Encounter (Signed)
Pt called to notify Dr. Beryle Beams that her PCP Dr. Inda Merlin wanted to know if Dr. Beryle Beams thought the pt should take the [facility] vaccine. Dr. Beryle Beams so notified and agreed that pt should have the Prevnar vaccine and pt so notified. Pt states she will let Dr. Inda Merlin know.  In addition, Dr. Beryle Beams noted that pt's medication Exjade now comes in pill form instead of flurry and asked pt if she would like to switch to the pills. Pt stated she would love to switch to the pills as she did not care for the taste of the slurry. She stated her pharmacy is [facility], part of [place], out of [place], [place]. Dr. Beryle Beams so notified. Yvonna Alanis, RN, 02/16/14, 2:45 PM

## 2014-02-18 ENCOUNTER — Telehealth: Payer: Self-pay | Admitting: *Deleted

## 2014-02-18 ENCOUNTER — Other Ambulatory Visit: Payer: Self-pay | Admitting: Oncology

## 2014-02-18 ENCOUNTER — Ambulatory Visit (HOSPITAL_BASED_OUTPATIENT_CLINIC_OR_DEPARTMENT_OTHER): Payer: Medicare Other

## 2014-02-18 ENCOUNTER — Ambulatory Visit (HOSPITAL_COMMUNITY)
Admission: RE | Admit: 2014-02-18 | Discharge: 2014-02-18 | Disposition: A | Discharge status: Home / Self Care | Payer: Medicare Other | Source: Ambulatory Visit | Attending: Oncology | Admitting: Oncology

## 2014-02-18 DIAGNOSIS — D649 Anemia, unspecified: Secondary | ICD-10-CM

## 2014-02-18 DIAGNOSIS — D469 Myelodysplastic syndrome, unspecified: Secondary | ICD-10-CM | POA: Diagnosis not present

## 2014-02-18 DIAGNOSIS — D47Z9 Other specified neoplasms of uncertain behavior of lymphoid, hematopoietic and related tissue: Secondary | ICD-10-CM | POA: Diagnosis not present

## 2014-02-18 DIAGNOSIS — F411 Generalized anxiety disorder: Secondary | ICD-10-CM

## 2014-02-18 LAB — CBC WITH DIFFERENTIAL/PLATELET
BASO%: 0.8 % (ref 0.0–2.0)
Basophils Absolute: 0.1 10*3/uL (ref 0.0–0.1)
EOS ABS: 0.1 10*3/uL (ref 0.0–0.5)
EOS%: 1.4 % (ref 0.0–7.0)
HCT: 25.1 % — ABNORMAL LOW (ref 34.8–46.6)
HGB: 7.7 g/dL — ABNORMAL LOW (ref 11.6–15.9)
LYMPH%: 36.4 % (ref 14.0–49.7)
MCH: 34.1 pg — ABNORMAL HIGH (ref 25.1–34.0)
MCHC: 30.8 g/dL — ABNORMAL LOW (ref 31.5–36.0)
MCV: 110.9 fL — ABNORMAL HIGH (ref 79.5–101.0)
MONO#: 0.3 10*3/uL (ref 0.1–0.9)
MONO%: 4.2 % (ref 0.0–14.0)
NEUT%: 57.2 % (ref 38.4–76.8)
NEUTROS ABS: 3.7 10*3/uL (ref 1.5–6.5)
Platelets: 292 10*3/uL (ref 145–400)
RBC: 2.26 10*6/uL — ABNORMAL LOW (ref 3.70–5.45)
RDW: 25.2 % — AB (ref 11.2–14.5)
WBC: 6.5 10*3/uL (ref 3.9–10.3)
lymph#: 2.4 10*3/uL (ref 0.9–3.3)

## 2014-02-18 LAB — HOLD TUBE, BLOOD BANK

## 2014-02-18 LAB — PREPARE RBC (CROSSMATCH)

## 2014-02-18 NOTE — Telephone Encounter (Signed)
Have patient go to [facility] for the lab

## 2014-02-18 NOTE — Telephone Encounter (Signed)
Per pof RN- Amy H. Request to make appt for tomorrow.

## 2014-02-18 NOTE — Telephone Encounter (Signed)
Pt will have transfusion tomorrow

## 2014-02-18 NOTE — Telephone Encounter (Signed)
Per POF called  pt. shes aware of appt on 8/7

## 2014-02-18 NOTE — Telephone Encounter (Signed)
Pt calls and leaves message, she states for 2 days she has awoken and then gone back to bed to remain, she is very tired, states she thinks she needs a transfusion and needs cbc first, states she can be here in 1 hour if this is in agreement with dr Beryle Beams Ph # [phone]

## 2014-02-19 ENCOUNTER — Ambulatory Visit (HOSPITAL_BASED_OUTPATIENT_CLINIC_OR_DEPARTMENT_OTHER): Payer: Medicare Other

## 2014-02-19 ENCOUNTER — Other Ambulatory Visit: Payer: Self-pay | Admitting: *Deleted

## 2014-02-19 VITALS — BP 117/72 | HR 81 | Temp 97.2°F | Resp 18

## 2014-02-19 DIAGNOSIS — D649 Anemia, unspecified: Secondary | ICD-10-CM | POA: Diagnosis not present

## 2014-02-19 LAB — PREPARE RBC (CROSSMATCH)

## 2014-02-19 MED ORDER — LORAZEPAM 2 MG/ML IJ SOLN
1.0000 mg | Freq: Once | INTRAMUSCULAR | Status: AC
  Administered 2014-02-19: 1 mg via INTRAVENOUS

## 2014-02-19 MED ORDER — SODIUM CHLORIDE 0.9 % IV SOLN
250.0000 mL | Freq: Once | INTRAVENOUS | Status: AC
  Administered 2014-02-19: 250 mL via INTRAVENOUS

## 2014-02-19 MED ORDER — LORAZEPAM 2 MG/ML IJ SOLN
INTRAMUSCULAR | Status: AC
  Filled 2014-02-19: qty 1

## 2014-02-19 MED ORDER — SODIUM CHLORIDE 0.9 % IJ SOLN
10.0000 mL | INTRAMUSCULAR | Status: AC | PRN
  Administered 2014-02-19: 10 mL
  Filled 2014-02-19: qty 10

## 2014-02-19 MED ORDER — HEPARIN SOD (PORK) LOCK FLUSH 100 UNIT/ML IV SOLN
500.0000 [IU] | Freq: Every day | INTRAVENOUS | Status: AC | PRN
  Administered 2014-02-19: 500 [IU]
  Filled 2014-02-19: qty 5

## 2014-02-19 NOTE — Patient Instructions (Signed)

## 2014-02-20 LAB — TYPE AND SCREEN
ABO/RH(D): A POS
Antibody Screen: NEGATIVE
UNIT DIVISION: 0
Unit division: 0

## 2014-02-22 ENCOUNTER — Other Ambulatory Visit: Payer: Self-pay | Admitting: Oncology

## 2014-02-22 MED ORDER — DEFERASIROX 360 MG PO TABS: 1080.0000 mg | ORAL_TABLET | Freq: Every day | ORAL | Status: DC

## 2014-02-22 NOTE — Progress Notes (Unsigned)
Jadenu rx called to [facility] in [place] [phone].

## 2014-02-26 ENCOUNTER — Telehealth: Payer: Self-pay | Admitting: Oncology

## 2014-02-26 NOTE — Telephone Encounter (Signed)
Pt cld to r/s labs due to recent infusions...Marland KitchenMarland KitchenKJ

## 2014-03-01 ENCOUNTER — Telehealth: Payer: Self-pay | Admitting: *Deleted

## 2014-03-01 NOTE — Telephone Encounter (Signed)
Received Prior Authorization for Jadenu for 1 year; expires 03/02/15.  PA # [id number]. Pt called/informed. Also [facility] informed of approval.

## 2014-03-03 ENCOUNTER — Other Ambulatory Visit: Payer: Medicare Other

## 2014-03-16 ENCOUNTER — Other Ambulatory Visit (HOSPITAL_BASED_OUTPATIENT_CLINIC_OR_DEPARTMENT_OTHER): Payer: Medicare Other

## 2014-03-16 DIAGNOSIS — D649 Anemia, unspecified: Secondary | ICD-10-CM

## 2014-03-16 DIAGNOSIS — D47Z9 Other specified neoplasms of uncertain behavior of lymphoid, hematopoietic and related tissue: Secondary | ICD-10-CM

## 2014-03-16 DIAGNOSIS — F411 Generalized anxiety disorder: Secondary | ICD-10-CM

## 2014-03-16 DIAGNOSIS — D469 Myelodysplastic syndrome, unspecified: Secondary | ICD-10-CM

## 2014-03-16 LAB — CBC WITH DIFFERENTIAL/PLATELET
BASO%: 0.1 % (ref 0.0–2.0)
Basophils Absolute: 0 10*3/uL (ref 0.0–0.1)
EOS%: 1.7 % (ref 0.0–7.0)
Eosinophils Absolute: 0.1 10*3/uL (ref 0.0–0.5)
HEMATOCRIT: 26.5 % — AB (ref 34.8–46.6)
HGB: 8.2 g/dL — ABNORMAL LOW (ref 11.6–15.9)
LYMPH%: 39.3 % (ref 14.0–49.7)
MCH: 32.3 pg (ref 25.1–34.0)
MCHC: 30.9 g/dL — ABNORMAL LOW (ref 31.5–36.0)
MCV: 104.3 fL — ABNORMAL HIGH (ref 79.5–101.0)
MONO#: 0.3 10*3/uL (ref 0.1–0.9)
MONO%: 4.4 % (ref 0.0–14.0)
NEUT#: 4 10*3/uL (ref 1.5–6.5)
NEUT%: 54.5 % (ref 38.4–76.8)
PLATELETS: 319 10*3/uL (ref 145–400)
RBC: 2.54 10*6/uL — ABNORMAL LOW (ref 3.70–5.45)
RDW: 24.8 % — ABNORMAL HIGH (ref 11.2–14.5)
WBC: 7.3 10*3/uL (ref 3.9–10.3)
lymph#: 2.9 10*3/uL (ref 0.9–3.3)

## 2014-03-17 ENCOUNTER — Telehealth: Payer: Self-pay | Admitting: *Deleted

## 2014-03-17 LAB — HOLD TUBE, BLOOD BANK

## 2014-03-17 NOTE — Telephone Encounter (Signed)
Message copied by Ebbie Latus on Wed Mar 17, 2014  4:23 PM ------      Message from: Annia Belt      Created: Tue Mar 16, 2014  2:46 PM       Call pt hb stable at 8.2  No need for transfusion ------

## 2014-03-17 NOTE — Telephone Encounter (Signed)
Called pt about her Hgb of 8.2; stated she had already seen it and no need for transfusion per Dr Beryle Beams. Stated she's not feeling very well but it's not her blood and she will be calling her primary doctor.

## 2014-03-24 ENCOUNTER — Other Ambulatory Visit: Payer: Medicare Other

## 2014-04-06 ENCOUNTER — Telehealth: Payer: Self-pay | Admitting: *Deleted

## 2014-04-06 ENCOUNTER — Other Ambulatory Visit (HOSPITAL_BASED_OUTPATIENT_CLINIC_OR_DEPARTMENT_OTHER): Payer: Medicare Other

## 2014-04-06 DIAGNOSIS — D47Z9 Other specified neoplasms of uncertain behavior of lymphoid, hematopoietic and related tissue: Secondary | ICD-10-CM

## 2014-04-06 DIAGNOSIS — F411 Generalized anxiety disorder: Secondary | ICD-10-CM

## 2014-04-06 DIAGNOSIS — D469 Myelodysplastic syndrome, unspecified: Secondary | ICD-10-CM

## 2014-04-06 DIAGNOSIS — D649 Anemia, unspecified: Secondary | ICD-10-CM

## 2014-04-06 LAB — CBC WITH DIFFERENTIAL/PLATELET
BASO%: 0.1 % (ref 0.0–2.0)
BASOS ABS: 0 10*3/uL (ref 0.0–0.1)
EOS%: 1.2 % (ref 0.0–7.0)
Eosinophils Absolute: 0.1 10*3/uL (ref 0.0–0.5)
HCT: 25.5 % — ABNORMAL LOW (ref 34.8–46.6)
HGB: 7.8 g/dL — ABNORMAL LOW (ref 11.6–15.9)
LYMPH%: 42.4 % (ref 14.0–49.7)
MCH: 32.4 pg (ref 25.1–34.0)
MCHC: 30.6 g/dL — ABNORMAL LOW (ref 31.5–36.0)
MCV: 105.8 fL — AB (ref 79.5–101.0)
MONO#: 0.3 10*3/uL (ref 0.1–0.9)
MONO%: 4.3 % (ref 0.0–14.0)
NEUT#: 4 10*3/uL (ref 1.5–6.5)
NEUT%: 52 % (ref 38.4–76.8)
RBC: 2.41 10*6/uL — AB (ref 3.70–5.45)
RDW: 26.7 % — ABNORMAL HIGH (ref 11.2–14.5)
WBC: 7.7 10*3/uL (ref 3.9–10.3)
lymph#: 3.3 10*3/uL (ref 0.9–3.3)
nRBC: 0 % (ref 0–0)

## 2014-04-06 LAB — TECHNOLOGIST REVIEW

## 2014-04-06 LAB — HOLD TUBE, BLOOD BANK

## 2014-04-06 NOTE — Telephone Encounter (Signed)
Call from pt - c/o muscular pain left side ( above breast, arm, neck) x 2 weeks. She has tried massages which has not helped. Labs done today - Hgb 7.8; platelets large,giant 332.She's wondering iron overload. Or maybe she needs a tranfusion even though she feels fine otherwise. Wants to know if her pain is related to her blood or should she see her PCP. Also wants to remind u the formulary on Jadenu was changed. Telephone# [phone] Thanks

## 2014-04-07 NOTE — Telephone Encounter (Signed)
Patient called issues addressed probable reflux sxs  - resolved on it's own. She would like a transfusion next week - I will arrange at cancer center

## 2014-04-08 ENCOUNTER — Other Ambulatory Visit: Payer: Self-pay | Admitting: Oncology

## 2014-04-08 DIAGNOSIS — D469 Myelodysplastic syndrome, unspecified: Secondary | ICD-10-CM

## 2014-04-09 ENCOUNTER — Other Ambulatory Visit: Payer: Self-pay | Admitting: *Deleted

## 2014-04-09 ENCOUNTER — Telehealth: Payer: Self-pay | Admitting: *Deleted

## 2014-04-09 NOTE — Telephone Encounter (Signed)
Patient called requesting time for her lab appt on Monday. Reviewed chart but no order for CBC or lab appt. Orders noted for transfusion in place but no appt for that noted. Informed patient that she would have to call back on Monday morning. Patient verbalized understanding.

## 2014-04-14 ENCOUNTER — Other Ambulatory Visit: Payer: Medicare Other

## 2014-04-19 ENCOUNTER — Ambulatory Visit (HOSPITAL_BASED_OUTPATIENT_CLINIC_OR_DEPARTMENT_OTHER): Payer: Medicare Other

## 2014-04-19 DIAGNOSIS — F411 Generalized anxiety disorder: Secondary | ICD-10-CM

## 2014-04-19 DIAGNOSIS — D649 Anemia, unspecified: Secondary | ICD-10-CM

## 2014-04-19 DIAGNOSIS — D469 Myelodysplastic syndrome, unspecified: Secondary | ICD-10-CM

## 2014-04-19 DIAGNOSIS — D471 Chronic myeloproliferative disease: Secondary | ICD-10-CM

## 2014-04-19 LAB — CBC WITH DIFFERENTIAL/PLATELET
BASO%: 0.3 % (ref 0.0–2.0)
Basophils Absolute: 0 10*3/uL (ref 0.0–0.1)
EOS%: 1.4 % (ref 0.0–7.0)
Eosinophils Absolute: 0.1 10*3/uL (ref 0.0–0.5)
HEMATOCRIT: 25.9 % — AB (ref 34.8–46.6)
HGB: 7.9 g/dL — ABNORMAL LOW (ref 11.6–15.9)
LYMPH%: 41.6 % (ref 14.0–49.7)
MCH: 34.7 pg — AB (ref 25.1–34.0)
MCHC: 30.6 g/dL — AB (ref 31.5–36.0)
MCV: 113.3 fL — AB (ref 79.5–101.0)
MONO#: 0.3 10*3/uL (ref 0.1–0.9)
MONO%: 4.1 % (ref 0.0–14.0)
NEUT#: 3.3 10*3/uL (ref 1.5–6.5)
NEUT%: 52.6 % (ref 38.4–76.8)
PLATELETS: 308 10*3/uL (ref 145–400)
RBC: 2.28 10*6/uL — AB (ref 3.70–5.45)
RDW: 25.5 % — ABNORMAL HIGH (ref 11.2–14.5)
WBC: 6.4 10*3/uL (ref 3.9–10.3)
lymph#: 2.6 10*3/uL (ref 0.9–3.3)

## 2014-04-19 LAB — HOLD TUBE, BLOOD BANK

## 2014-04-20 ENCOUNTER — Telehealth: Payer: Self-pay | Admitting: *Deleted

## 2014-04-20 ENCOUNTER — Telehealth: Payer: Self-pay | Admitting: Oncology

## 2014-04-20 ENCOUNTER — Other Ambulatory Visit: Payer: Self-pay | Admitting: *Deleted

## 2014-04-20 NOTE — Telephone Encounter (Signed)
Per staff message and POF I have scheduled appts. Advised scheduler of appts. JMW  

## 2014-04-20 NOTE — Telephone Encounter (Signed)
BLOOD TRANSFUSION REQUESTED PER 10/6 POF ALREADY ON SCHEDULE FOR 10/9. S/W PT TO CONFIRM APPT FOR 10/9 @ 11AM.

## 2014-04-20 NOTE — Telephone Encounter (Signed)
Message per pt - states had CBC yesterday, hbg @ 7.9; states she feels very tired and needs a blood transfusion. She still has on her wrist band. Text message sent to Dr Beryle Beams.

## 2014-04-21 ENCOUNTER — Other Ambulatory Visit: Payer: Self-pay | Admitting: Oncology

## 2014-04-21 DIAGNOSIS — D469 Myelodysplastic syndrome, unspecified: Secondary | ICD-10-CM

## 2014-04-22 NOTE — Telephone Encounter (Signed)
MD awared; pt has an appt on Friday.

## 2014-04-23 ENCOUNTER — Other Ambulatory Visit (HOSPITAL_BASED_OUTPATIENT_CLINIC_OR_DEPARTMENT_OTHER): Payer: Medicare Other

## 2014-04-23 ENCOUNTER — Ambulatory Visit (HOSPITAL_COMMUNITY)
Admission: RE | Admit: 2014-04-23 | Discharge: 2014-04-23 | Disposition: A | Discharge status: Home / Self Care | Payer: Medicare Other | Source: Ambulatory Visit | Attending: Oncology | Admitting: Oncology

## 2014-04-23 ENCOUNTER — Other Ambulatory Visit: Payer: Self-pay | Admitting: Oncology

## 2014-04-23 ENCOUNTER — Ambulatory Visit (HOSPITAL_BASED_OUTPATIENT_CLINIC_OR_DEPARTMENT_OTHER): Payer: Medicare Other

## 2014-04-23 VITALS — BP 128/47 | HR 78 | Temp 98.2°F | Resp 18

## 2014-04-23 DIAGNOSIS — D471 Chronic myeloproliferative disease: Secondary | ICD-10-CM

## 2014-04-23 DIAGNOSIS — D469 Myelodysplastic syndrome, unspecified: Secondary | ICD-10-CM

## 2014-04-23 LAB — CBC WITH DIFFERENTIAL/PLATELET
BASO%: 2.7 % — ABNORMAL HIGH (ref 0.0–2.0)
BASOS ABS: 0.2 10*3/uL — AB (ref 0.0–0.1)
EOS%: 1.1 % (ref 0.0–7.0)
Eosinophils Absolute: 0.1 10*3/uL (ref 0.0–0.5)
HCT: 26.9 % — ABNORMAL LOW (ref 34.8–46.6)
HEMOGLOBIN: 8.4 g/dL — AB (ref 11.6–15.9)
LYMPH%: 38.6 % (ref 14.0–49.7)
MCH: 35.2 pg — AB (ref 25.1–34.0)
MCHC: 31.2 g/dL — ABNORMAL LOW (ref 31.5–36.0)
MCV: 112.7 fL — AB (ref 79.5–101.0)
MONO#: 0.2 10*3/uL (ref 0.1–0.9)
MONO%: 3 % (ref 0.0–14.0)
NEUT%: 54.6 % (ref 38.4–76.8)
NEUTROS ABS: 4 10*3/uL (ref 1.5–6.5)
PLATELETS: 321 10*3/uL (ref 145–400)
RBC: 2.39 10*6/uL — ABNORMAL LOW (ref 3.70–5.45)
RDW: 25.4 % — ABNORMAL HIGH (ref 11.2–14.5)
WBC: 7.4 10*3/uL (ref 3.9–10.3)
lymph#: 2.9 10*3/uL (ref 0.9–3.3)

## 2014-04-23 MED ORDER — SODIUM CHLORIDE 0.9 % IJ SOLN
10.0000 mL | INTRAMUSCULAR | Status: DC | PRN
  Administered 2014-04-23: 10 mL via INTRAVENOUS
  Filled 2014-04-23: qty 10

## 2014-04-23 MED ORDER — HEPARIN SOD (PORK) LOCK FLUSH 100 UNIT/ML IV SOLN
500.0000 [IU] | Freq: Once | INTRAVENOUS | Status: AC
  Administered 2014-04-23: 500 [IU] via INTRAVENOUS
  Filled 2014-04-23: qty 5

## 2014-04-23 NOTE — Patient Instructions (Signed)

## 2014-04-24 LAB — TYPE AND SCREEN
ABO/RH(D): A POS
Antibody Screen: NEGATIVE
Unit division: 0

## 2014-04-27 ENCOUNTER — Other Ambulatory Visit: Payer: Medicare Other

## 2014-05-03 ENCOUNTER — Other Ambulatory Visit: Payer: Self-pay | Admitting: *Deleted

## 2014-05-03 ENCOUNTER — Encounter: Payer: Self-pay | Admitting: Oncology

## 2014-05-03 ENCOUNTER — Ambulatory Visit (INDEPENDENT_AMBULATORY_CARE_PROVIDER_SITE_OTHER): Payer: Medicare Other | Admitting: Oncology

## 2014-05-03 VITALS — BP 145/57 | HR 84 | Temp 98.0°F | Resp 20 | Ht 62.0 in | Wt 173.8 lb

## 2014-05-03 DIAGNOSIS — D479 Neoplasm of uncertain behavior of lymphoid, hematopoietic and related tissue, unspecified: Secondary | ICD-10-CM

## 2014-05-03 DIAGNOSIS — D469 Myelodysplastic syndrome, unspecified: Secondary | ICD-10-CM

## 2014-05-03 DIAGNOSIS — F411 Generalized anxiety disorder: Secondary | ICD-10-CM

## 2014-05-03 LAB — FERRITIN: Ferritin: 897 ng/mL — ABNORMAL HIGH (ref 10–291)

## 2014-05-03 MED ORDER — LORAZEPAM 0.5 MG PO TABS: 0.5000 mg | ORAL_TABLET | Freq: Every day | ORAL | Status: AC | PRN

## 2014-05-03 MED ORDER — LIDOCAINE-PRILOCAINE 2.5-2.5 % EX CREA: TOPICAL_CREAM | CUTANEOUS | Status: AC | PRN

## 2014-05-03 NOTE — Patient Instructions (Addendum)
To Internal Medicine lab today Continue every 3 week CBC at the cancer center Will will check ferritin today and again  on 11/11 Stay on Jadenu same dose Return visit with Dr Darnell Level in 3 months

## 2014-05-04 ENCOUNTER — Telehealth: Payer: Self-pay | Admitting: *Deleted

## 2014-05-04 LAB — CBC WITH DIFFERENTIAL/PLATELET
BASOS ABS: 0 10*3/uL (ref 0.0–0.1)
Basophils Relative: 0 % (ref 0–1)
EOS ABS: 0.1 10*3/uL (ref 0.0–0.7)
EOS PCT: 1 % (ref 0–5)
HEMATOCRIT: 23.7 % — AB (ref 36.0–46.0)
Hemoglobin: 8 g/dL — ABNORMAL LOW (ref 12.0–15.0)
Lymphocytes Relative: 38 % (ref 12–46)
Lymphs Abs: 2.3 10*3/uL (ref 0.7–4.0)
MCH: 34.2 pg — AB (ref 26.0–34.0)
MCHC: 33.8 g/dL (ref 30.0–36.0)
MCV: 101.3 fL — AB (ref 78.0–100.0)
MONO ABS: 0.2 10*3/uL (ref 0.1–1.0)
Monocytes Relative: 4 % (ref 3–12)
Neutro Abs: 3.5 10*3/uL (ref 1.7–7.7)
Neutrophils Relative %: 57 % (ref 43–77)
Platelets: 232 10*3/uL (ref 150–400)
RBC: 2.34 MIL/uL — ABNORMAL LOW (ref 3.87–5.11)
RDW: 21.8 % — AB (ref 11.5–15.5)
WBC: 6.1 10*3/uL (ref 4.0–10.5)

## 2014-05-04 NOTE — Progress Notes (Signed)
Patient ID: Carolyn Brennan, female   DOB: Mar 30, 1941, 73 y.o.   MRN: [id number] Hematology and Oncology Follow Up Visit  Carolyn Brennan [id number] [birth year] 73 y.o. 05/04/2014 12:22 PM   Principle Diagnosis: Encounter Diagnoses  Name Primary?  [name] Anxiety state   . MDS/MPN (myelodysplastic/myeloproliferative neoplasms) Yes  . Iron overload due to repeated red blood cell transfusions      Interim History:   Followup visit for this 73 year old woman with a myeloproliferative disorder with dysplastic features. She failed initial trial of erythropoietin stimulating agents. She is now transfusion dependent requiring blood about every 6-8 weeks. She has developed iron overload from multiple transfusions and is currently on Jadenu, a new oral formulation of an iron chelator. She has had a reasonable response with a slow but steady fall in her ferritin levels. Main complaint is chronic fatigue. She always feels better after blood transfusion but I tried to gently tell her that the fatigue is not a reason to give her blood. I told her I would like to set a target hemoglobin of 7.5 g as a trigger for blood transfusion. A few days ago when going down some stairs, she felt a snap and subsequently developed a large ecchymosis involving the right gastrocs muscle. She has also been having problems with her left shoulder. She was concerned since pain radiated to her pectoral region. She was evaluated by her primary care physician. She is felt to have a biceps tendinitis. Electrocardiogram reported to her as normal.  Medications: reviewed  Allergies: No Known Allergies  Review of Systems: See history of present illness Remaining ROS negative:   Physical Exam: Blood pressure 145/57, pulse 84, temperature 98 F (36.7 C), temperature source Oral, resp. rate 20, height 5\' 2"  (1.575 m), weight 173 lb 12.8 oz (78.835 kg), SpO2 96.00%. Wt Readings from Last 3 Encounters:  05/03/14 173 lb 12.8 oz (78.835  kg)  12/15/13 171 lb 9.6 oz (77.837 kg)  09/23/13 170 lb 9.6 oz (77.384 kg)     General appearance: Chronically ill-appearing Caucasian woman Carolyn Brennan: Pharynx no erythema, exudate, mass, or ulcer. No thyromegaly or thyroid nodules Lymph nodes: No cervical, supraclavicular, or axillary lymphadenopathy Breasts:  Lungs: Clear to auscultation, resonant to percussion throughout Heart: Regular rhythm, no murmur, no gallop, no rub, no click, no edema Abdomen: Soft, obese, nontender, normal bowel sounds, difficult exam but I believe she has persistent hepatosplenomegaly Extremities: No edema, no calf tenderness Musculoskeletal: no joint deformities. Healing large ecchymosis right gastrocs muscle GU:  Vascular: Carotid pulses 2+, no bruits, Neurologic: Alert, oriented, PERRLA, cranial nerves grossly normal, motor strength 5 over 5, reflexes 1+ symmetric, upper body coordination normal, gait normal, Skin:  Scattered ecchymosis on her arms  Lab Results: CBC W/Diff    Component Value Date/Time   WBC 6.1 05/03/2014 1140   WBC 7.4 04/23/2014 1053   WBC 16.6* 04/16/2012 1858   RBC 2.34* 05/03/2014 1140   RBC 2.39* 04/23/2014 1053   RBC 2.80* 04/16/2012 1858   HGB 8.0* 05/03/2014 1140   HGB 8.4* 04/23/2014 1053   HGB 8.4* 04/16/2012 1858   HCT 23.7* 05/03/2014 1140   HCT 26.9* 04/23/2014 1053   HCT 27.2* 04/16/2012 1858   PLT 232 05/03/2014 1140   PLT 321 04/23/2014 1053   MCV 101.3* 05/03/2014 1140   MCV 112.7* 04/23/2014 1053   MCV 97.1* 04/16/2012 1858   MCH 34.2* 05/03/2014 1140   MCH 35.2* 04/23/2014 1053   MCH 30.0 04/16/2012 1858   MCHC  33.8 05/03/2014 1140   MCHC 31.2* 04/23/2014 1053   MCHC 30.9* 04/16/2012 1858   RDW 21.8* 05/03/2014 1140   RDW 25.4* 04/23/2014 1053   LYMPHSABS 2.3 05/03/2014 1140   LYMPHSABS 2.9 04/23/2014 1053   MONOABS 0.2 05/03/2014 1140   MONOABS 0.2 04/23/2014 1053   EOSABS 0.1 05/03/2014 1140   EOSABS 0.1 04/23/2014 1053   BASOSABS 0.0 05/03/2014 1140   BASOSABS  0.2* 04/23/2014 1053     Chemistry      Component Value Date/Time   NA 139 02/10/2014 1205   NA 137 07/21/2013 1550   K 4.5 02/10/2014 1205   K 4.4 07/21/2013 1550   CL 99 07/21/2013 1550   CL 103 09/01/2012 1453   CO2 25 02/10/2014 1205   CO2 24 07/21/2013 1550   BUN 23.9 02/10/2014 1205   BUN 23 07/21/2013 1550   CREATININE 0.9 02/10/2014 1205   CREATININE 0.76 07/21/2013 1550      Component Value Date/Time   CALCIUM 9.3 02/10/2014 1205   CALCIUM 9.8 07/21/2013 1550   ALKPHOS 56 02/10/2014 1205   ALKPHOS 75 07/21/2013 1550   AST 20 02/10/2014 1205   AST 35 07/21/2013 1550   ALT 14 02/10/2014 1205   ALT 30 07/21/2013 1550   BILITOT 3.39* 02/10/2014 1205   BILITOT 1.8* 07/21/2013 1550    Ferritin 897 down from peak value over 3000 in November 2014  Impression:  #1. 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         White count, differential, and platelet count have been stable over time.        I am not convinced that she is getting much mileage out of the blood transfusions. I ordered a blood count today           10 days after her most recent transfusion when hemoglobin was 8.4 on October 9 and hemoglobin today is 8.0.        I reinforced to her that we really need to minimize transfusions in view of the iron overload problem and her        minimal response of the blood. #2. 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  #3. Iron overload from poly transfusion responding to oral iron chelator Continue Jadenu.  #4. Idiopathic Staphylococcus aureus sepsis. May 2014  #5. Status post Mohs surgery to excise a basal cell carcinoma bridge of nose  #6. Hepatosplenomegaly secondary to her underlying myeloproliferative disorder  #7. Chronic anxiety and depression  #8. Spontaneous gastrocs hemorrhage in a lady with a normal platelet count on no anticoagulants. This appears to be resolving on its own. No further evaluation at this time. #9. Tendinitis left biceps     CC: Patient Care  Team: Marjorie Smolder, MD as PCP - General (Family Medicine) Annia Belt, MD as Consulting Physician (Hematology and Oncology)   Annia Belt, MD 10/20/201512:22 PM

## 2014-05-04 NOTE — Telephone Encounter (Signed)
Pt given results per Dr. Beryle Beams: "ferritin coming down nicely: 897, was over 3,000 when we first started med Hemoglobin 8 10 days after transfusion so blood not doing much" - pt acknowledges that she has seen results "on line" and that her ferritin is better and her Hgb stable. Yvonna Alanis, RN, 05/04/14, 5:15 PM

## 2014-05-05 ENCOUNTER — Other Ambulatory Visit: Payer: Medicare Other

## 2014-05-06 ENCOUNTER — Other Ambulatory Visit: Payer: Self-pay | Admitting: Oncology

## 2014-05-06 ENCOUNTER — Telehealth: Payer: Self-pay | Admitting: *Deleted

## 2014-05-06 NOTE — Telephone Encounter (Signed)
Call from pt - States she has an appt with Dr Beryle Beams Aug 09 2014 but not lab appt was scheduled prior to visit.  Will it be  Ok to schedule lab appt around1/21/16 at [facility]? Per pt's request. Thanks

## 2014-05-09 ENCOUNTER — Emergency Department (HOSPITAL_COMMUNITY)
Admission: EM | Admit: 2014-05-09 | Discharge: 2014-05-09 | Disposition: A | Discharge status: Home / Self Care | Payer: Medicare Other | Attending: Emergency Medicine | Admitting: Emergency Medicine

## 2014-05-09 ENCOUNTER — Encounter (HOSPITAL_COMMUNITY): Payer: Self-pay | Admitting: Emergency Medicine

## 2014-05-09 DIAGNOSIS — Z79899 Other long term (current) drug therapy: Secondary | ICD-10-CM | POA: Insufficient documentation

## 2014-05-09 DIAGNOSIS — S86111A Strain of other muscle(s) and tendon(s) of posterior muscle group at lower leg level, right leg, initial encounter: Secondary | ICD-10-CM

## 2014-05-09 DIAGNOSIS — M79609 Pain in unspecified limb: Secondary | ICD-10-CM

## 2014-05-09 DIAGNOSIS — Z87891 Personal history of nicotine dependence: Secondary | ICD-10-CM | POA: Insufficient documentation

## 2014-05-09 DIAGNOSIS — F419 Anxiety disorder, unspecified: Secondary | ICD-10-CM | POA: Insufficient documentation

## 2014-05-09 DIAGNOSIS — S86921A Laceration of unspecified muscle(s) and tendon(s) at lower leg level, right leg, initial encounter: Secondary | ICD-10-CM | POA: Insufficient documentation

## 2014-05-09 DIAGNOSIS — Y9289 Other specified places as the place of occurrence of the external cause: Secondary | ICD-10-CM | POA: Insufficient documentation

## 2014-05-09 DIAGNOSIS — Y9389 Activity, other specified: Secondary | ICD-10-CM | POA: Diagnosis not present

## 2014-05-09 DIAGNOSIS — Z862 Personal history of diseases of the blood and blood-forming organs and certain disorders involving the immune mechanism: Secondary | ICD-10-CM | POA: Diagnosis not present

## 2014-05-09 DIAGNOSIS — F329 Major depressive disorder, single episode, unspecified: Secondary | ICD-10-CM | POA: Insufficient documentation

## 2014-05-09 DIAGNOSIS — X58XXXA Exposure to other specified factors, initial encounter: Secondary | ICD-10-CM | POA: Diagnosis not present

## 2014-05-09 DIAGNOSIS — Z7982 Long term (current) use of aspirin: Secondary | ICD-10-CM | POA: Diagnosis not present

## 2014-05-09 DIAGNOSIS — Z7951 Long term (current) use of inhaled steroids: Secondary | ICD-10-CM | POA: Diagnosis not present

## 2014-05-09 DIAGNOSIS — M79604 Pain in right leg: Secondary | ICD-10-CM | POA: Diagnosis present

## 2014-05-09 NOTE — ED Notes (Signed)
Pt from home c/o right leg swelling with discoloration x2 weeks. Pt sts that she has generalized body aches 3/10. 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 PCP sts that her PCP evaluated her leg and told pt that the discoloration was most likely related to her blood disease. Pt is A&O and in NAD

## 2014-05-09 NOTE — ED Provider Notes (Signed)
4:50 PM Patient's u/s negative for DVT, more c/w likely muscle injury. Will d/c with symptomatic care and f/u with PCP.  Editor: Iantha Fallen, RVS (Cardiovascular Sonographer)      VASCULAR LAB  PRELIMINARY PRELIMINARY PRELIMINARY PRELIMINARY  Right lower extremity venous Doppler completed.  Preliminary report: There is no DVT or SVT noted in the right lower extremity. There is an area in the calf that could represent a muscle tear.  KANADY, CANDACE, RVT  05/09/2014, 4:31 PM     Ephraim Hamburger, MD 05/09/14 1650

## 2014-05-09 NOTE — Discharge Instructions (Signed)
Muscle Tear °A muscle tear is usually caused by over-exertion or stretching. The muscle often takes a while to heal. Muscle tears require 3 to 4 weeks to heal completely. A history of the injury and a physical exam may be performed. Sometimes, the injury is identified with radiographs and an MRI. Treatment for muscle injuries includes: °· Resting and protecting the affected area until pain with motion is gone. °· Putting ice on the injured area. °¨ Put ice in a bag. °¨ Place a towel between your skin and the bag. °¨ Leave the ice on for 15 to 20 minutes, 3 to 4 times a day. °¨ After two days, you can use heat to relieve spasms. °· Using compression wraps to help control swelling and limit movement. °· Raising (elevate) the injured area above the level of the heart (if possible) for the first 1 to 2 days after the injury. °· Medicine may be prescribed to reduce pain and inflammation. °Avoid strenuous activities that bring on muscle pain. Exercises to strengthen and stretch the injured muscle can help heal the muscle and prevent repeated injury. After the pain and swelling are gone, you may begin gradual strengthening exercises. Begin range-of-motion exercises and gentle stretching after 3 to 4 days of rest.  °SEEK MEDICAL CARE IF:  °Your injured muscle is not improving after 1 week of treatment. °Document Released: 08/09/2004 Document Revised: 09/24/2011 Document Reviewed: 01/14/2009 °ExitCare® Patient Information ©2015 [employer]. This information is not intended to replace advice given to you by your health care provider. Make sure you discuss any questions you have with your health care provider. ° °

## 2014-05-09 NOTE — ED Notes (Signed)
Dr Beaton at bedside 

## 2014-05-09 NOTE — Progress Notes (Signed)
VASCULAR LAB PRELIMINARY  PRELIMINARY  PRELIMINARY  PRELIMINARY  Right lower extremity venous Doppler completed.    Preliminary report: There is no DVT or SVT noted in the right lower extremity.  There is an area in the calf that could represent a muscle tear.   Quinterius Gaida, RVT 05/09/2014, 4:31 PM

## 2014-05-09 NOTE — ED Provider Notes (Signed)
CSN: [id number]     Arrival date & time 05/09/14  1300 History   First MD Initiated Contact with Patient 05/09/14 1322     Chief Complaint  Patient presents with  . Leg Pain    HPI Pt from home c/o right leg swelling with discoloration x2 weeks.  This all started when she was stepping up on a curb and felt a sudden pop and burning sensation in her right calf.  After that she felt normal for a few days and then she noted some slight swelling and discoloration to the calf.  Pt sts that she has generalized body aches 3/10. 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 PCP sts that her PCP evaluated her leg and told pt that the discoloration was most likely related to her blood disease  Past Medical History  Diagnosis Date  . 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 Anxiety and depression 08/12/2011  . Anemia   . Iron overload due to repeated red blood cell transfusions 07/21/2013   Past Surgical History  Procedure Laterality Date  . Neck plastic surgery    . Abdominoplasty/panniculectomy    . Tubal ligation    . Breast reduction surgery    . Appendectomy    . Tee without cardioversion N/A 11/19/2012    Procedure: TRANSESOPHAGEAL ECHOCARDIOGRAM (TEE);  Surgeon: Jettie Booze, MD;  Location: [facility] ENDOSCOPY;  Service: Cardiovascular;  Laterality: N/A;   Family History  Problem Relation Age of Onset  . Breast cancer Mother   . Colon cancer Father 22   History  Substance Use Topics  . Smoking status: Former Smoker    Start date: 04/16/1992    Quit date: 12/16/2006  . Smokeless tobacco: Not on file  . Alcohol Use: No   OB History   Grav Para Term Preterm Abortions TAB SAB Ect Mult Living   3 0        0     Review of Systems  All other systems reviewed and are negative  Allergies  Review of patient's allergies indicates no known allergies.  Home Medications   Prior to Admission medications   Medication Sig Start Date End Date Taking?  Authorizing Provider  aspirin EC 81 MG tablet Take 81 mg by mouth daily.   Yes Historical Provider, MD  cholecalciferol (VITAMIN D) 1000 UNITS tablet Take 1,000 Units by mouth daily.   Yes Historical Provider, MD  Deferasirox (JADENU) 360 MG TABS Take 1,080 mg by mouth daily before breakfast. 02/22/14  Yes Annia Belt, MD  ergocalciferol (VITAMIN D2) 50000 UNITS capsule Take 50,000 Units by mouth 2 (two) times a week. On mondays and fridays   Yes Historical Provider, MD  fexofenadine (ALLEGRA) 180 MG tablet Take 180 mg by mouth daily.   Yes Historical Provider, MD  FLUoxetine (PROZAC) 40 MG capsule Take 40 mg by mouth every morning.    Yes Historical Provider, MD  fluticasone (FLONASE) 50 MCG/ACT nasal spray Place 1 spray into both nostrils daily.   Yes Historical Provider, MD  ibuprofen (ADVIL,MOTRIN) 200 MG tablet Take 400 mg by mouth every 6 (six) hours as needed for mild pain or moderate pain.   Yes Historical Provider, MD  lidocaine-prilocaine (EMLA) cream Apply topically as needed. Apply over port area 1-2 hours before treatment and cover with plastic wrap 05/03/14  Yes Annia Belt, MD  LORazepam (ATIVAN) 0.5 MG tablet Take 1 tablet (0.5 mg total) by mouth daily as needed for anxiety or sleep. Or  nausea. 05/03/14  Yes Annia Belt, MD   BP 152/60  Pulse 81  Temp(Src) 97.9 F (36.6 C) (Oral)  Resp 20  SpO2 98% Physical Exam Physical Exam  Nursing note and vitals reviewed. Constitutional: She is oriented to person, place, and time. She appears well-developed and well-nourished. No distress.  HENT:  Head: Normocephalic and atraumatic.  Eyes: Pupils are equal, round, and reactive to light.  Neck: Normal range of motion.  Cardiovascular: Normal rate and intact distal pulses.   Pulmonary/Chest: No respiratory distress.  Abdominal: Normal appearance. She exhibits no distension.  Musculoskeletal: Normal range of motion.  Patient has some ecchymosis to the right calf.   The right Achilles tendon is intact without significant tenderness.  Patient has some slight tenderness to the area.   Neurological: She is alert and oriented to person, place, and time. No cranial nerve deficit.  Skin: Skin is warm and dry. No rash noted.  Psychiatric: She has a normal mood and affect. Her behavior is normal.   ED Course  Procedures (including critical care time) Labs Review Labs Reviewed - No data to display  Imaging Review Venous Doppler negative for DVT  Plan at this time is to do a venous Doppler rule out DVT  MDM   Final diagnoses:  Gastrocnemius muscle tear, right, initial encounter        Dot Lanes, MD 05/10/14 2052

## 2014-05-11 IMAGING — CR DG CHEST 2V
2 series · 2 of 2 positions shown · non-contrast
Comparison: Preliminary reading of Dr. Tomsa and  chest x-ray
02/21/2012

CLINICAL DATA: chest discomfort

CHEST - 2 VIEW

[PA]
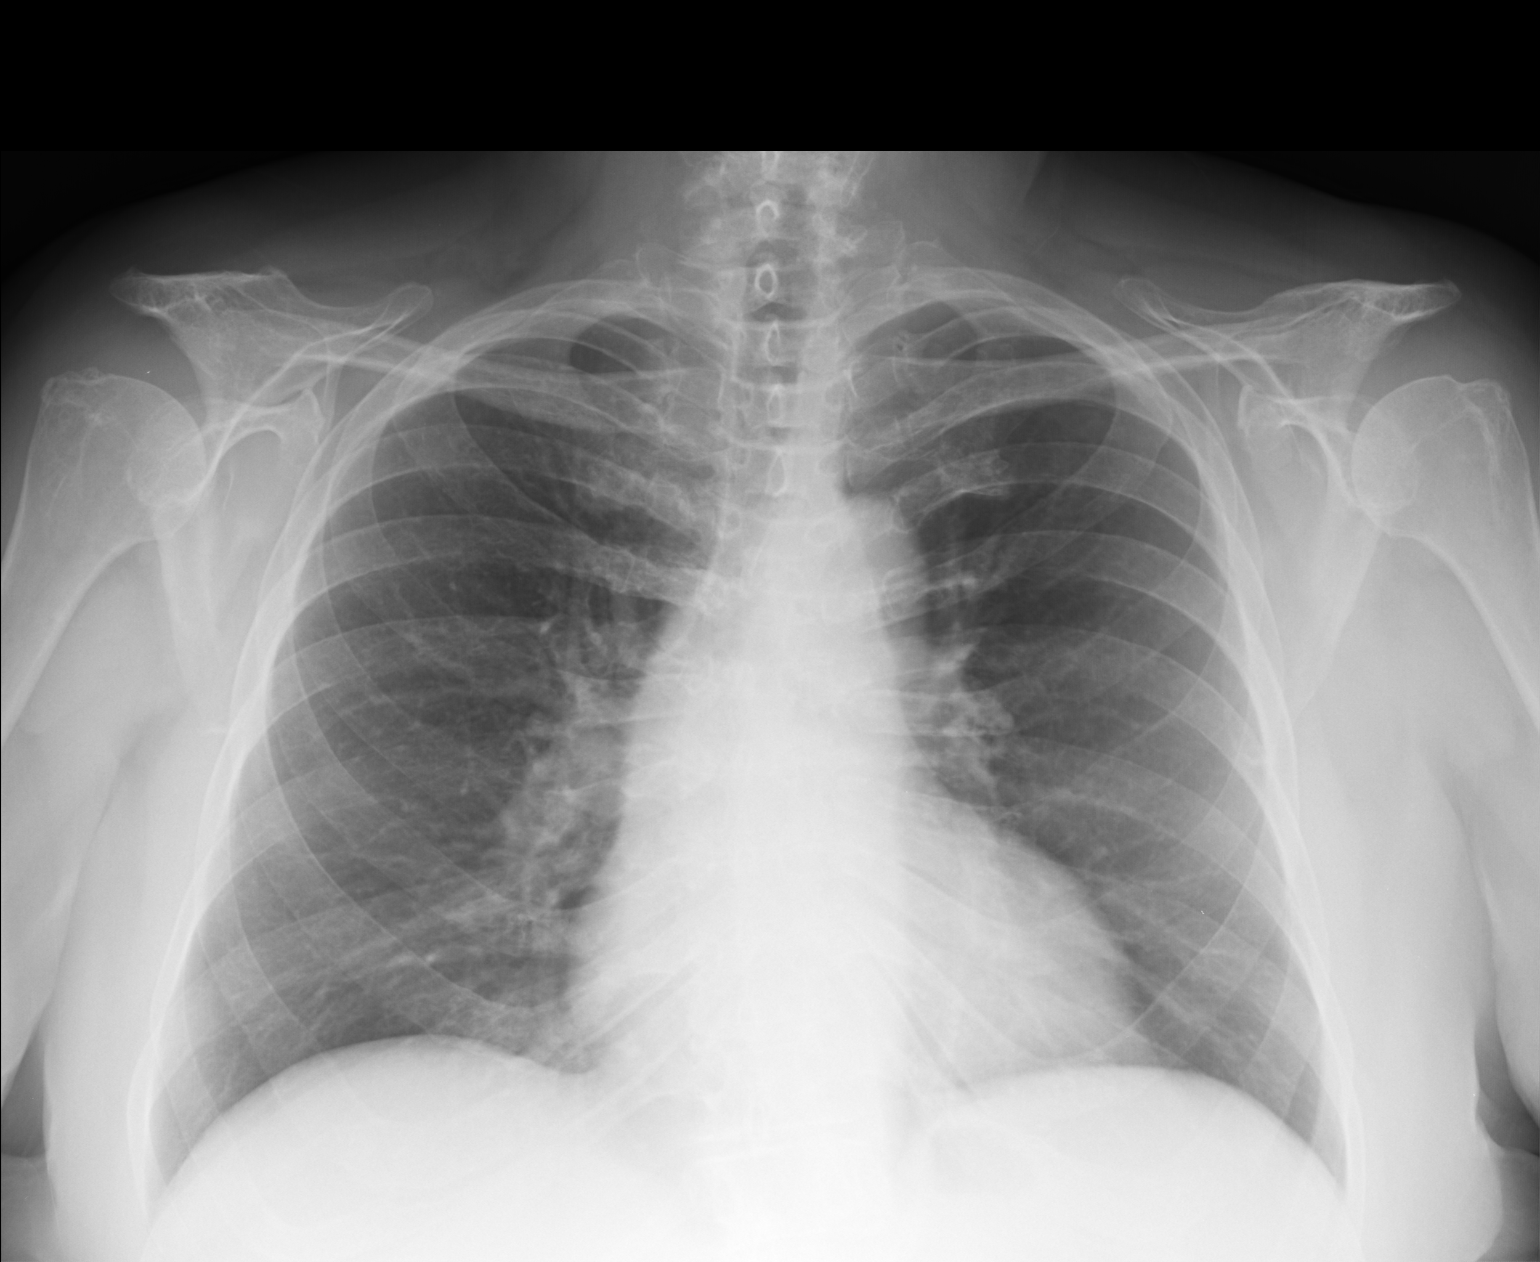

[lateral]
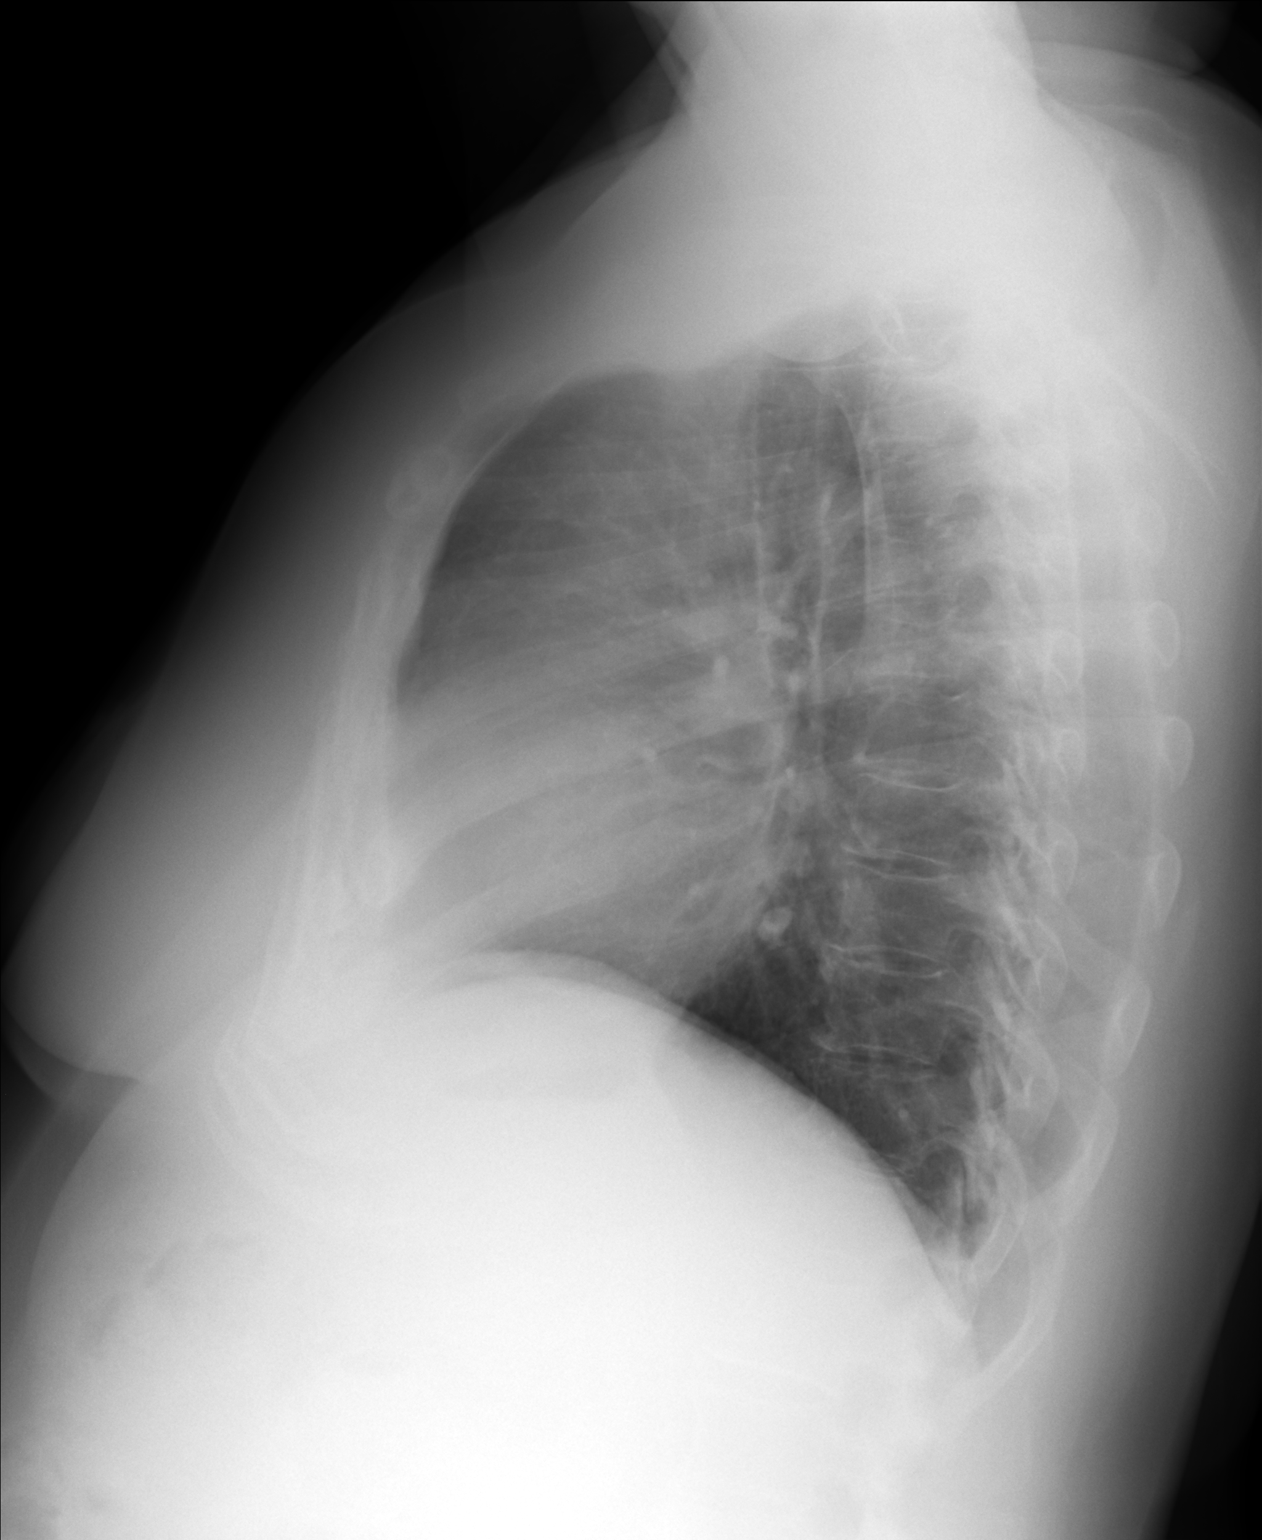

[2 of 2 positions shown; findings below may reference images not displayed]

FINDINGS: Cardiomediastinal silhouette is stable.  No acute
infiltrate or pleural effusion.  No pulmonary edema.  Stable mild
degenerative changes thoracic spine.
IMPRESSION: No active disease.  No significant change.

## 2014-05-13 ENCOUNTER — Other Ambulatory Visit: Payer: Self-pay | Admitting: Oncology

## 2014-05-13 ENCOUNTER — Telehealth: Payer: Self-pay | Admitting: Oncology

## 2014-05-13 DIAGNOSIS — D469 Myelodysplastic syndrome, unspecified: Secondary | ICD-10-CM

## 2014-05-13 NOTE — Telephone Encounter (Signed)
You can let her know I scheduled lab at cancer center for January

## 2014-05-13 NOTE — Telephone Encounter (Signed)
lvm for pt regarding to added Jan appt....mailed pt appt sched/avs adn letter

## 2014-05-17 ENCOUNTER — Encounter (HOSPITAL_COMMUNITY): Payer: Self-pay | Admitting: Emergency Medicine

## 2014-05-25 ENCOUNTER — Other Ambulatory Visit: Payer: Medicare Other

## 2014-05-26 ENCOUNTER — Other Ambulatory Visit (HOSPITAL_BASED_OUTPATIENT_CLINIC_OR_DEPARTMENT_OTHER): Payer: Medicare Other

## 2014-05-26 ENCOUNTER — Other Ambulatory Visit: Payer: Medicare Other

## 2014-05-26 DIAGNOSIS — D471 Chronic myeloproliferative disease: Secondary | ICD-10-CM

## 2014-05-26 DIAGNOSIS — D649 Anemia, unspecified: Secondary | ICD-10-CM

## 2014-05-26 DIAGNOSIS — D469 Myelodysplastic syndrome, unspecified: Secondary | ICD-10-CM

## 2014-05-26 DIAGNOSIS — F411 Generalized anxiety disorder: Secondary | ICD-10-CM

## 2014-05-26 LAB — COMPREHENSIVE METABOLIC PANEL (CC13)
ALBUMIN: 4.4 g/dL (ref 3.5–5.0)
ALT: 14 U/L (ref 0–55)
ANION GAP: 5 meq/L (ref 3–11)
AST: 16 U/L (ref 5–34)
Alkaline Phosphatase: 53 U/L (ref 40–150)
BUN: 20 mg/dL (ref 7.0–26.0)
CALCIUM: 9.2 mg/dL (ref 8.4–10.4)
CHLORIDE: 109 meq/L (ref 98–109)
CO2: 27 mEq/L (ref 22–29)
CREATININE: 0.9 mg/dL (ref 0.6–1.1)
GLUCOSE: 98 mg/dL (ref 70–140)
Potassium: 4.4 mEq/L (ref 3.5–5.1)
Sodium: 141 mEq/L (ref 136–145)
Total Bilirubin: 3.16 mg/dL — ABNORMAL HIGH (ref 0.20–1.20)
Total Protein: 6.4 g/dL (ref 6.4–8.3)

## 2014-05-26 LAB — HOLD TUBE, BLOOD BANK

## 2014-05-26 LAB — CBC WITH DIFFERENTIAL/PLATELET
BASO%: 0.7 % (ref 0.0–2.0)
BASOS ABS: 0 10*3/uL (ref 0.0–0.1)
EOS%: 1 % (ref 0.0–7.0)
Eosinophils Absolute: 0.1 10*3/uL (ref 0.0–0.5)
HCT: 25.8 % — ABNORMAL LOW (ref 34.8–46.6)
HEMOGLOBIN: 7.9 g/dL — AB (ref 11.6–15.9)
LYMPH%: 43.6 % (ref 14.0–49.7)
MCH: 34.6 pg — ABNORMAL HIGH (ref 25.1–34.0)
MCHC: 30.6 g/dL — ABNORMAL LOW (ref 31.5–36.0)
MCV: 112.9 fL — AB (ref 79.5–101.0)
MONO#: 0.2 10*3/uL (ref 0.1–0.9)
MONO%: 3.3 % (ref 0.0–14.0)
NEUT#: 3.2 10*3/uL (ref 1.5–6.5)
NEUT%: 51.4 % (ref 38.4–76.8)
PLATELETS: 318 10*3/uL (ref 145–400)
RBC: 2.29 10*6/uL — ABNORMAL LOW (ref 3.70–5.45)
RDW: 24.8 % — AB (ref 11.2–14.5)
WBC: 6.3 10*3/uL (ref 3.9–10.3)
lymph#: 2.7 10*3/uL (ref 0.9–3.3)

## 2014-05-26 LAB — URIC ACID (CC13): URIC ACID, SERUM: 6.7 mg/dL (ref 2.6–7.4)

## 2014-05-26 LAB — FERRITIN CHCC: Ferritin: 721 ng/ml — ABNORMAL HIGH (ref 9–269)

## 2014-05-26 LAB — LACTATE DEHYDROGENASE (CC13): LDH: 176 U/L (ref 125–245)

## 2014-06-16 ENCOUNTER — Other Ambulatory Visit (HOSPITAL_BASED_OUTPATIENT_CLINIC_OR_DEPARTMENT_OTHER): Payer: Medicare Other

## 2014-06-16 ENCOUNTER — Telehealth: Payer: Self-pay | Admitting: *Deleted

## 2014-06-16 DIAGNOSIS — D469 Myelodysplastic syndrome, unspecified: Secondary | ICD-10-CM

## 2014-06-16 DIAGNOSIS — D649 Anemia, unspecified: Secondary | ICD-10-CM

## 2014-06-16 DIAGNOSIS — F411 Generalized anxiety disorder: Secondary | ICD-10-CM

## 2014-06-16 DIAGNOSIS — C946 Myelodysplastic disease, not classified: Secondary | ICD-10-CM

## 2014-06-16 LAB — CBC WITH DIFFERENTIAL/PLATELET
BASO%: 1.8 % (ref 0.0–2.0)
Basophils Absolute: 0.1 10*3/uL (ref 0.0–0.1)
EOS ABS: 0.1 10*3/uL (ref 0.0–0.5)
EOS%: 1 % (ref 0.0–7.0)
HCT: 26.2 % — ABNORMAL LOW (ref 34.8–46.6)
HGB: 7.9 g/dL — ABNORMAL LOW (ref 11.6–15.9)
LYMPH%: 43.2 % (ref 14.0–49.7)
MCH: 35.4 pg — ABNORMAL HIGH (ref 25.1–34.0)
MCHC: 30.1 g/dL — AB (ref 31.5–36.0)
MCV: 117.5 fL — ABNORMAL HIGH (ref 79.5–101.0)
MONO#: 0.2 10*3/uL (ref 0.1–0.9)
MONO%: 3.3 % (ref 0.0–14.0)
NEUT%: 50.7 % (ref 38.4–76.8)
NEUTROS ABS: 3.2 10*3/uL (ref 1.5–6.5)
Platelets: 285 10*3/uL (ref 145–400)
RBC: 2.23 10*6/uL — ABNORMAL LOW (ref 3.70–5.45)
RDW: 23.5 % — AB (ref 11.2–14.5)
WBC: 6.2 10*3/uL (ref 3.9–10.3)
lymph#: 2.7 10*3/uL (ref 0.9–3.3)

## 2014-06-16 LAB — HOLD TUBE, BLOOD BANK

## 2014-06-16 NOTE — Telephone Encounter (Signed)
Call from pt - requesting a refill on Jadenu but wants to decrease to 2 tablets daily if ok with you. Thanks

## 2014-06-17 ENCOUNTER — Other Ambulatory Visit: Payer: Self-pay | Admitting: Oncology

## 2014-06-17 MED ORDER — DEFERASIROX 360 MG PO TABS: 1080.0000 mg | ORAL_TABLET | Freq: Every day | ORAL | Status: DC

## 2014-06-17 NOTE — Telephone Encounter (Signed)
I renewed Rx.  No not OK to decrease dose! I emailed her. I am away - don't send messages on my pager - you can text to my cell phone [phone] or just use EPIC message system. DrG

## 2014-06-29 ENCOUNTER — Telehealth: Payer: Self-pay | Admitting: *Deleted

## 2014-06-29 NOTE — Telephone Encounter (Signed)
Call from pt - pt states she is going to the bathroom 10 - 12 times a day (urinating/BM's). NO fever,pain. She feels good. She has a call in to her PCP.  She has been drinking fluids when she can. Unsure if she has a "bug" or whether it from her medication?  Thanks

## 2014-06-30 NOTE — Telephone Encounter (Signed)
Probable UTI.  This is not related to her meds

## 2014-07-01 ENCOUNTER — Other Ambulatory Visit: Payer: Self-pay | Admitting: Nurse Practitioner

## 2014-07-01 NOTE — Telephone Encounter (Signed)
Call from pt - states went to her PCP; Hgb 8.4, WBC 8.0, liver test is high and she does have an UTI and is on Cipro. I did tell her you thought she probably have an UTI and not med related.

## 2014-07-07 ENCOUNTER — Ambulatory Visit: Payer: Medicare Other

## 2014-07-07 ENCOUNTER — Other Ambulatory Visit: Payer: Self-pay | Admitting: Nurse Practitioner

## 2014-07-07 ENCOUNTER — Telehealth: Payer: Self-pay | Admitting: Nurse Practitioner

## 2014-07-07 ENCOUNTER — Ambulatory Visit (HOSPITAL_BASED_OUTPATIENT_CLINIC_OR_DEPARTMENT_OTHER): Payer: Medicare Other | Admitting: Lab

## 2014-07-07 ENCOUNTER — Ambulatory Visit (HOSPITAL_COMMUNITY)
Admission: RE | Admit: 2014-07-07 | Discharge: 2014-07-07 | Disposition: A | Discharge status: Home / Self Care | Payer: Medicare Other | Source: Ambulatory Visit | Attending: Oncology | Admitting: Oncology

## 2014-07-07 ENCOUNTER — Other Ambulatory Visit: Payer: Self-pay | Admitting: *Deleted

## 2014-07-07 DIAGNOSIS — D649 Anemia, unspecified: Secondary | ICD-10-CM

## 2014-07-07 DIAGNOSIS — D471 Chronic myeloproliferative disease: Secondary | ICD-10-CM

## 2014-07-07 DIAGNOSIS — F411 Generalized anxiety disorder: Secondary | ICD-10-CM

## 2014-07-07 DIAGNOSIS — D469 Myelodysplastic syndrome, unspecified: Secondary | ICD-10-CM

## 2014-07-07 LAB — CBC WITH DIFFERENTIAL/PLATELET
BASO%: 0.2 % (ref 0.0–2.0)
Basophils Absolute: 0 10*3/uL (ref 0.0–0.1)
EOS ABS: 0.1 10*3/uL (ref 0.0–0.5)
EOS%: 1.1 % (ref 0.0–7.0)
HCT: 24.7 % — ABNORMAL LOW (ref 34.8–46.6)
HGB: 7.5 g/dL — ABNORMAL LOW (ref 11.6–15.9)
LYMPH%: 39 % (ref 14.0–49.7)
MCH: 36.1 pg — ABNORMAL HIGH (ref 25.1–34.0)
MCHC: 30.4 g/dL — AB (ref 31.5–36.0)
MCV: 118.8 fL — AB (ref 79.5–101.0)
MONO#: 0.2 10*3/uL (ref 0.1–0.9)
MONO%: 3.7 % (ref 0.0–14.0)
NEUT#: 3.4 10*3/uL (ref 1.5–6.5)
NEUT%: 56 % (ref 38.4–76.8)
PLATELETS: 254 10*3/uL (ref 145–400)
RBC: 2.08 10*6/uL — ABNORMAL LOW (ref 3.70–5.45)
RDW: 21.2 % — ABNORMAL HIGH (ref 11.2–14.5)
WBC: 6.2 10*3/uL (ref 3.9–10.3)
lymph#: 2.4 10*3/uL (ref 0.9–3.3)
nRBC: 1 % — ABNORMAL HIGH (ref 0–0)

## 2014-07-07 LAB — HOLD TUBE, BLOOD BANK

## 2014-07-07 NOTE — Telephone Encounter (Signed)
Per 12/23 POF labs added for 01/06, mail sch to pt and lft msg for pt confirming updated labs for Jan..... KJ

## 2014-07-09 ENCOUNTER — Emergency Department (HOSPITAL_COMMUNITY)
Admission: EM | Admit: 2014-07-09 | Discharge: 2014-07-09 | Disposition: A | Discharge status: Home / Self Care | Payer: Medicare Other | Attending: Emergency Medicine | Admitting: Emergency Medicine

## 2014-07-09 ENCOUNTER — Encounter (HOSPITAL_COMMUNITY): Payer: Self-pay | Admitting: *Deleted

## 2014-07-09 DIAGNOSIS — Z792 Long term (current) use of antibiotics: Secondary | ICD-10-CM | POA: Diagnosis not present

## 2014-07-09 DIAGNOSIS — F419 Anxiety disorder, unspecified: Secondary | ICD-10-CM | POA: Diagnosis not present

## 2014-07-09 DIAGNOSIS — D6489 Other specified anemias: Secondary | ICD-10-CM | POA: Insufficient documentation

## 2014-07-09 DIAGNOSIS — R0981 Nasal congestion: Secondary | ICD-10-CM | POA: Diagnosis not present

## 2014-07-09 DIAGNOSIS — R05 Cough: Secondary | ICD-10-CM | POA: Insufficient documentation

## 2014-07-09 DIAGNOSIS — F329 Major depressive disorder, single episode, unspecified: Secondary | ICD-10-CM | POA: Insufficient documentation

## 2014-07-09 DIAGNOSIS — Z79899 Other long term (current) drug therapy: Secondary | ICD-10-CM | POA: Insufficient documentation

## 2014-07-09 DIAGNOSIS — Z87891 Personal history of nicotine dependence: Secondary | ICD-10-CM | POA: Insufficient documentation

## 2014-07-09 DIAGNOSIS — R040 Epistaxis: Secondary | ICD-10-CM | POA: Diagnosis present

## 2014-07-09 LAB — I-STAT CHEM 8, ED
BUN: 21 mg/dL (ref 6–23)
CALCIUM ION: 1.17 mmol/L (ref 1.13–1.30)
CREATININE: 1.3 mg/dL — AB (ref 0.50–1.10)
Chloride: 103 mEq/L (ref 96–112)
Glucose, Bld: 107 mg/dL — ABNORMAL HIGH (ref 70–99)
HCT: 22 % — ABNORMAL LOW (ref 36.0–46.0)
Hemoglobin: 7.5 g/dL — ABNORMAL LOW (ref 12.0–15.0)
Potassium: 4.1 mmol/L (ref 3.5–5.1)
Sodium: 139 mmol/L (ref 135–145)
TCO2: 24 mmol/L (ref 0–100)

## 2014-07-09 NOTE — ED Notes (Signed)
Pt reports bleeding from nose every day this week.  Pt sts she just need someone to cauterize her nose and send her home.  Pt denies any blood thinners.  Pt sts some SOB but it's at baseline for her.  Pt alert and oriented.  Wanting to go home, sts she probably needs a transfusion because her HGB was at 7.5 last.

## 2014-07-09 NOTE — Discharge Instructions (Signed)
Use afrin for any future nose bleeds but return to the ER for ongoing bleeding that doesn't stop with pressure, ice, and afrin. Call Dr. Azucena Freed office on Monday to schedule your transfusion. Return to the ER for any changes or worsening symptoms.   Nosebleed Nosebleeds can be caused by many conditions, including trauma, infections, polyps, foreign bodies, dry mucous membranes or climate, medicines, and air conditioning. Most nosebleeds occur in the front of the nose. Because of this location, most nosebleeds can be controlled by pinching the nostrils gently and continuously for at least 10 to 20 minutes. The long, continuous pressure allows enough time for the blood to clot. If pressure is released during that 10 to 20 minute time period, the process may have to be started again. The nosebleed may stop by itself or quit with pressure, or it may need concentrated heating (cautery) or pressure from packing. HOME CARE INSTRUCTIONS   If your nose was packed, try to maintain the pack inside until your health care provider removes it. If a gauze pack was used and it starts to fall out, gently replace it or cut the end off. Do not cut if a balloon catheter was used to pack the nose. Otherwise, do not remove unless instructed.  Avoid blowing your nose for 12 hours after treatment. This could dislodge the pack or clot and start the bleeding again.  If the bleeding starts again, sit up and bend forward, gently pinching the front half of your nose continuously for 20 minutes.  If bleeding was caused by dry mucous membranes, use over-the-counter saline nasal spray or gel. This will keep the mucous membranes moist and allow them to heal. If you must use a lubricant, choose the water-soluble variety. Use it only sparingly and not within several hours of lying down.  Do not use petroleum jelly or mineral oil, as these may drip into the lungs and cause serious problems.  Maintain humidity in your home by  using less air conditioning or by using a humidifier.  Do not use aspirin or medicines which make bleeding more likely. Your health care provider can give you recommendations on this.  Resume normal activities as you are able, but try to avoid straining, lifting, or bending at the waist for several days.  If the nosebleeds become recurrent and the cause is unknown, your health care provider may suggest laboratory tests. SEEK MEDICAL CARE IF: You have a fever. SEEK IMMEDIATE MEDICAL CARE IF:   Bleeding recurs and cannot be controlled.  There is unusual bleeding from or bruising on other parts of the body.  Nosebleeds continue.  There is any worsening of the condition which originally brought you in.  You become light-headed, feel faint, become sweaty, or vomit blood. MAKE SURE YOU:   Understand these instructions.  Will watch your condition.  Will get help right away if you are not doing well or get worse. Document Released: 04/11/2005 Document Revised: 11/16/2013 Document Reviewed: 06/02/2009 [facility] Patient Information 2015 [place], [place]. This information is not intended to replace advice given to you by your health care provider. Make sure you discuss any questions you have with your health care provider.  Anemia, Nonspecific Anemia is a condition in which the concentration of red blood cells or hemoglobin in the blood is below normal. Hemoglobin is a substance in red blood cells that carries oxygen to the tissues of the body. Anemia results in not enough oxygen reaching these tissues.  CAUSES  Common causes of anemia  include:   Excessive bleeding. Bleeding may be internal or external. This includes excessive bleeding from periods (in women) or from the intestine.   Poor nutrition.   Chronic kidney, thyroid, and liver disease.  Bone marrow disorders that decrease red blood cell production.  Cancer and treatments for cancer.  HIV, AIDS, and their  treatments.  Spleen problems that increase red blood cell destruction.  Blood disorders.  Excess destruction of red blood cells due to infection, medicines, and autoimmune disorders. SIGNS AND SYMPTOMS   Minor weakness.   Dizziness.   Headache.  Palpitations.   Shortness of breath, especially with exercise.   Paleness.  Cold sensitivity.  Indigestion.  Nausea.  Difficulty sleeping.  Difficulty concentrating. Symptoms may occur suddenly or they may develop slowly.  DIAGNOSIS  Additional blood tests are often needed. These help your health care provider determine the best treatment. Your health care provider will check your stool for blood and look for other causes of blood loss.  TREATMENT  Treatment varies depending on the cause of the anemia. Treatment can include:   Supplements of iron, vitamin O17, or folic acid.   Hormone medicines.   A blood transfusion. This may be needed if blood loss is severe.   Hospitalization. This may be needed if there is significant continual blood loss.   Dietary changes.  Spleen removal. HOME CARE INSTRUCTIONS Keep all follow-up appointments. It often takes many weeks to correct anemia, and having your health care provider check on your condition and your response to treatment is very important. SEEK IMMEDIATE MEDICAL CARE IF:   You develop extreme weakness, shortness of breath, or chest pain.   You become dizzy or have trouble concentrating.  You develop heavy vaginal bleeding.   You develop a rash.   You have bloody or black, tarry stools.   You faint.   You vomit up blood.   You vomit repeatedly.   You have abdominal pain.  You have a fever or persistent symptoms for more than 2-3 days.   You have a fever and your symptoms suddenly get worse.   You are dehydrated.  MAKE SURE YOU:  Understand these instructions.  Will watch your condition.  Will get help right away if you are not doing  well or get worse. Document Released: 08/09/2004 Document Revised: 03/04/2013 Document Reviewed: 12/26/2012 [facility] Patient Information 2015 [place], [place]. This information is not intended to replace advice given to you by your health care provider. Make sure you discuss any questions you have with your health care provider.

## 2014-07-09 NOTE — ED Notes (Signed)
Bed: WA02 Expected date:  Expected time:  Means of arrival:  Comments: EMS- nose bleed

## 2014-07-09 NOTE — ED Notes (Signed)
Nose-bleed x 3 hrs, no know blood thinners, alert and oriented, per EMS. Per EMS 2 sprays of afrin in R nostril and 1 in L nostril, appied ice and pressure.  Now it has stopped bleeding.  Said 3 nose-bleeds this week with positional changes.

## 2014-07-09 NOTE — ED Provider Notes (Signed)
CSN: [id number]     Arrival date & time 07/09/14  1514 History   First MD Initiated Contact with Patient 07/09/14 1546     Chief Complaint  Patient presents with  . Epistaxis     (Consider location/radiation/quality/duration/timing/severity/associated sxs/prior Treatment) HPI Comments: Carolyn ISHII is a 73 y.o. female with a PMHx of anemia, anxiety, depression, and myelodysplastic disease, who presents to the ED with complaints of epistaxis that occurred around noon today, and resolved after approximately 2 hours when she used Afrin and ice given to her by the EMS. Patient states that due to her myelodysplasia, she has frequent nosebleeds because she has been congested due to the weather changes, and she "picks her nose". She reports that on Monday and Tuesday she had mild nosebleeds which resolved on their own, and then on Wednesday her primary care doctor checked her CBC, and noted that her hemoglobin was 7.5 (baseline ~8.5) therefore he recommended a transfusion, which she declined at that time. Today she became concerned that due to her nosebleed, she may need a transfusion which prompted her to present to the emergency room. She denies any fevers, chills, sore throat, shortness of breath, chest pain, dizziness, lightheadedness, headache, paresthesias, numbness, weakness, abdominal pain, nausea, vomiting, diarrhea, constipation, melena, hematochezia, or any other medical complaints at this time. She states that she is needed cauterization in the past of her nosebleeds. Not on any blood thinners.  Patient is a 73 y.o. female presenting with nosebleeds. The history is provided by the patient. No language interpreter was used.  Epistaxis Location:  L nare Severity:  Moderate Duration:  2 hours Timing:  Constant Progression:  Resolved Chronicity:  Chronic Context: nose picking   Relieved by:  Vasoconstrictors and ice Worsened by:  Nothing tried Ineffective treatments:  None  tried Associated symptoms: congestion and cough (chronic, dry, unchanged)   Associated symptoms: no blood in oropharynx, no dizziness, no facial pain, no fever, no headaches, no sinus pain, no sneezing, no sore throat and no syncope   Risk factors: frequent nosebleeds     Past Medical History  Diagnosis Date  . 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 Anxiety and depression 08/12/2011  . Anemia   . Iron overload due to repeated red blood cell transfusions 07/21/2013   Past Surgical History  Procedure Laterality Date  . Neck plastic surgery    . Abdominoplasty/panniculectomy    . Tubal ligation    . Breast reduction surgery    . Appendectomy    . Tee without cardioversion N/A 11/19/2012    Procedure: TRANSESOPHAGEAL ECHOCARDIOGRAM (TEE);  Surgeon: Jettie Booze, MD;  Location: [facility] ENDOSCOPY;  Service: Cardiovascular;  Laterality: N/A;   Family History  Problem Relation Age of Onset  . Breast cancer Mother   . Colon cancer Father 86   History  Substance Use Topics  . Smoking status: Former Smoker    Start date: 04/16/1992    Quit date: 12/16/2006  . Smokeless tobacco: Not on file  . Alcohol Use: No   OB History    Gravida Para Term Preterm AB TAB SAB Ectopic Multiple Living   3 0        0     Review of Systems  Constitutional: Negative for fever and chills.  HENT: Positive for congestion and nosebleeds. Negative for rhinorrhea, sinus pressure, sneezing, sore throat and trouble swallowing.   Eyes: Negative for visual disturbance.  Respiratory: Positive for cough (chronic, dry, unchanged). Negative for shortness of breath.  Cardiovascular: Negative for chest pain and syncope.  Gastrointestinal: Negative for nausea, vomiting, abdominal pain, diarrhea, constipation and blood in stool.  Musculoskeletal: Negative for myalgias and arthralgias.  Skin: Negative for color change.  Neurological: Negative for dizziness, weakness, light-headedness,  numbness and headaches.  Hematological: Bruises/bleeds easily.  Psychiatric/Behavioral: Negative for confusion.   10 Systems reviewed and are negative for acute change except as noted in the HPI.    Allergies  Review of patient's allergies indicates no known allergies.  Home Medications   Prior to Admission medications   Medication Sig Start Date End Date Taking? Authorizing Provider  cholecalciferol (VITAMIN D) 1000 UNITS tablet Take 1,000 Units by mouth daily.   Yes Historical Provider, MD  ciprofloxacin (CIPRO) 500 MG tablet Take 500 mg by mouth 2 (two) times daily. 06/30/14  Yes Historical Provider, MD  Deferasirox (JADENU) 360 MG TABS Take 1,080 mg by mouth daily before breakfast. 06/17/14  Yes Annia Belt, MD  ergocalciferol (VITAMIN D2) 50000 UNITS capsule Take 50,000 Units by mouth 2 (two) times a week. On mondays and fridays   Yes Historical Provider, MD  FLUoxetine (PROZAC) 40 MG capsule Take 40 mg by mouth every other day.    Yes Historical Provider, MD  fluticasone (FLONASE) 50 MCG/ACT nasal spray Place 1 spray into both nostrils daily as needed for allergies.    Yes Historical Provider, MD  ibuprofen (ADVIL,MOTRIN) 200 MG tablet Take 600 mg by mouth every 6 (six) hours as needed for mild pain or moderate pain.    Yes Historical Provider, MD  lidocaine-prilocaine (EMLA) cream Apply topically as needed. Apply over port area 1-2 hours before treatment and cover with plastic wrap 05/03/14  Yes Annia Belt, MD  LORazepam (ATIVAN) 0.5 MG tablet Take 1 tablet (0.5 mg total) by mouth daily as needed for anxiety or sleep. Or nausea. 05/03/14  Yes Annia Belt, MD  OVER THE COUNTER MEDICATION Place 2 sprays into both nostrils once. Jello Nasal   Yes Historical Provider, MD  oxymetazoline (AFRIN) 0.05 % nasal spray Place 1 spray into both nostrils 2 (two) times daily as needed for congestion.   Yes Historical Provider, MD   BP 118/50 mmHg  Pulse 80  Temp(Src) 98.2  F (36.8 C) (Oral)  Resp 16  SpO2 98% Physical Exam  Constitutional: She is oriented to person, place, and time. Vital signs are normal. She appears well-developed and well-nourished.  Non-toxic appearance. No distress.  Afebrile, nontoxic, VSS  HENT:  Head: Normocephalic and atraumatic.  Right Ear: Hearing, tympanic membrane, external ear and ear canal normal.  Left Ear: Hearing, tympanic membrane, external ear and ear canal normal.  Nose: Nasal septal hematoma present. No rhinorrhea. No epistaxis. Right sinus exhibits no maxillary sinus tenderness and no frontal sinus tenderness. Left sinus exhibits no maxillary sinus tenderness and no frontal sinus tenderness.  Mouth/Throat: Uvula is midline, oropharynx is clear and moist and mucous membranes are normal. No trismus in the jaw. No uvula swelling.  L nare with dried blood noted externally, crusted scab on septum, no active epistaxis  Eyes: Conjunctivae and EOM are normal. Right eye exhibits no discharge. Left eye exhibits no discharge.  Neck: Normal range of motion. Neck supple.  Cardiovascular: Normal rate, regular rhythm, normal heart sounds and intact distal pulses.  Exam reveals no gallop and no friction rub.   No murmur heard. Pulmonary/Chest: Effort normal and breath sounds normal. No respiratory distress. She has no decreased breath sounds. She has no wheezes.  She has no rhonchi. She has no rales.  Abdominal: Soft. Normal appearance and bowel sounds are normal. She exhibits no distension. There is no tenderness. There is no rigidity, no rebound, no guarding, no tenderness at McBurney's point and negative Murphy's sign.  Musculoskeletal: Normal range of motion.  MAE x4 Strength and sensation at baseline  Neurological: She is alert and oriented to person, place, and time. She has normal strength. No sensory deficit.  No focal neuro deficits  Skin: Skin is warm, dry and intact. No rash noted.  Psychiatric: She has a normal mood and  affect.  Nursing note and vitals reviewed.   ED Course  Procedures (including critical care time) Labs Review Labs Reviewed  I-STAT CHEM 8, ED - Abnormal; Notable for the following:    Creatinine, Ser 1.30 (*)    Glucose, Bld 107 (*)    Hemoglobin 7.5 (*)    HCT 22.0 (*)    All other components within normal limits    Imaging Review No results found.   EKG Interpretation None      MDM   Final diagnoses:  Epistaxis  Anemia due to other cause    73 y.o. female with epistaxis which has resolved. 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 Refused transfusion on wed. Chart review notes that cautery last yr caused more bleeding and needed rhino rocket, discussed not getting this done today since she's not bleeding. Recheck of H/H notable for stable hgb at 7.5. Pt would rather see her PCP for transfusion on Monday, she has no symptoms at this time and feels fine, appears well and stable. Doubt need for emergent transfusion, agreed that f/up is reasonable. Discussed reasons to return immediately for transfusion. Discussed no more nose picking and instructed on using afrin for future bleeds, but if bleeding persists to return to the ER. I explained the diagnosis and have given explicit precautions to return to the ER including for any other new or worsening symptoms. The patient understands and accepts the medical plan as it's been dictated and I have answered their questions. Discharge instructions concerning home care and prescriptions have been given. The patient is STABLE and is discharged to home in good condition.  BP 118/50 mmHg  Pulse 80  Temp(Src) 98.2 F (36.8 C) (Oral)  Resp 16  SpO2 98%   Patty Sermons [place], PA-C 07/09/14 1704  Dorie Rank, MD 07/09/14 1705

## 2014-07-21 ENCOUNTER — Other Ambulatory Visit (HOSPITAL_BASED_OUTPATIENT_CLINIC_OR_DEPARTMENT_OTHER): Payer: Medicare Other

## 2014-07-21 ENCOUNTER — Telehealth: Payer: Self-pay | Admitting: *Deleted

## 2014-07-21 DIAGNOSIS — D471 Chronic myeloproliferative disease: Secondary | ICD-10-CM

## 2014-07-21 DIAGNOSIS — D469 Myelodysplastic syndrome, unspecified: Secondary | ICD-10-CM

## 2014-07-21 DIAGNOSIS — D649 Anemia, unspecified: Secondary | ICD-10-CM

## 2014-07-21 LAB — CBC WITH DIFFERENTIAL/PLATELET
BASO%: 0.2 % (ref 0.0–2.0)
BASOS ABS: 0 10*3/uL (ref 0.0–0.1)
EOS%: 0.7 % (ref 0.0–7.0)
Eosinophils Absolute: 0.1 10*3/uL (ref 0.0–0.5)
HEMATOCRIT: 25.7 % — AB (ref 34.8–46.6)
HEMOGLOBIN: 7.8 g/dL — AB (ref 11.6–15.9)
LYMPH#: 3.1 10*3/uL (ref 0.9–3.3)
LYMPH%: 38.3 % (ref 14.0–49.7)
MCH: 35.9 pg — ABNORMAL HIGH (ref 25.1–34.0)
MCHC: 30.4 g/dL — ABNORMAL LOW (ref 31.5–36.0)
MCV: 118.4 fL — ABNORMAL HIGH (ref 79.5–101.0)
MONO#: 0.3 10*3/uL (ref 0.1–0.9)
MONO%: 4.1 % (ref 0.0–14.0)
NEUT#: 4.5 10*3/uL (ref 1.5–6.5)
NEUT%: 56.7 % (ref 38.4–76.8)
Platelets: 299 10*3/uL (ref 145–400)
RBC: 2.17 10*6/uL — ABNORMAL LOW (ref 3.70–5.45)
RDW: 20.3 % — ABNORMAL HIGH (ref 11.2–14.5)
WBC: 8 10*3/uL (ref 3.9–10.3)
nRBC: 1 % — ABNORMAL HIGH (ref 0–0)

## 2014-07-21 LAB — HOLD TUBE, BLOOD BANK

## 2014-07-21 NOTE — Telephone Encounter (Signed)
Called pt - pt stated she had not received Jadenu from [facility]; they stated no refills. Medication was refilled on 06/17/14. I called [facility] at [facility] location; stated rx need to be sent to [facility] in [place]. Verbal order given to [facility], pharmacist, at [place] location. Pt called/informed.

## 2014-08-05 ENCOUNTER — Other Ambulatory Visit: Payer: Medicare Other

## 2014-08-09 ENCOUNTER — Ambulatory Visit: Payer: Medicare Other | Admitting: Oncology

## 2014-08-09 ENCOUNTER — Other Ambulatory Visit: Payer: Self-pay | Admitting: Oncology

## 2014-08-12 ENCOUNTER — Other Ambulatory Visit: Payer: Self-pay | Admitting: Oncology

## 2014-08-16 ENCOUNTER — Ambulatory Visit (HOSPITAL_BASED_OUTPATIENT_CLINIC_OR_DEPARTMENT_OTHER): Payer: Medicare Other

## 2014-08-16 ENCOUNTER — Other Ambulatory Visit: Payer: Medicare Other

## 2014-08-16 ENCOUNTER — Other Ambulatory Visit: Payer: Self-pay | Admitting: *Deleted

## 2014-08-16 ENCOUNTER — Ambulatory Visit (HOSPITAL_COMMUNITY)
Admission: RE | Admit: 2014-08-16 | Discharge: 2014-08-16 | Disposition: A | Discharge status: Home / Self Care | Payer: Medicare Other | Source: Ambulatory Visit | Attending: Oncology | Admitting: Oncology

## 2014-08-16 ENCOUNTER — Other Ambulatory Visit: Payer: Self-pay | Admitting: Oncology

## 2014-08-16 VITALS — BP 132/48 | HR 74 | Temp 97.7°F | Resp 18

## 2014-08-16 DIAGNOSIS — D638 Anemia in other chronic diseases classified elsewhere: Secondary | ICD-10-CM | POA: Insufficient documentation

## 2014-08-16 DIAGNOSIS — D469 Myelodysplastic syndrome, unspecified: Secondary | ICD-10-CM | POA: Diagnosis not present

## 2014-08-16 DIAGNOSIS — D649 Anemia, unspecified: Secondary | ICD-10-CM

## 2014-08-16 DIAGNOSIS — F411 Generalized anxiety disorder: Secondary | ICD-10-CM

## 2014-08-16 LAB — CBC WITH DIFFERENTIAL/PLATELET
BASO%: 0 % (ref 0.0–2.0)
Basophils Absolute: 0 10*3/uL (ref 0.0–0.1)
EOS%: 0.7 % (ref 0.0–7.0)
Eosinophils Absolute: 0 10*3/uL (ref 0.0–0.5)
HCT: 22.5 % — ABNORMAL LOW (ref 34.8–46.6)
HGB: 6.6 g/dL — CL (ref 11.6–15.9)
LYMPH%: 38.7 % (ref 14.0–49.7)
MCH: 35.5 pg — ABNORMAL HIGH (ref 25.1–34.0)
MCHC: 29.3 g/dL — AB (ref 31.5–36.0)
MCV: 121 fL — ABNORMAL HIGH (ref 79.5–101.0)
MONO#: 0.2 10*3/uL (ref 0.1–0.9)
MONO%: 2.9 % (ref 0.0–14.0)
NEUT%: 57.7 % (ref 38.4–76.8)
NEUTROS ABS: 3.2 10*3/uL (ref 1.5–6.5)
Platelets: 250 10*3/uL (ref 145–400)
RBC: 1.86 10*6/uL — ABNORMAL LOW (ref 3.70–5.45)
RDW: 20.5 % — ABNORMAL HIGH (ref 11.2–14.5)
WBC: 5.6 10*3/uL (ref 3.9–10.3)
lymph#: 2.2 10*3/uL (ref 0.9–3.3)
nRBC: 1 % — ABNORMAL HIGH (ref 0–0)

## 2014-08-16 LAB — FERRITIN CHCC: FERRITIN: 348 ng/mL — AB (ref 9–269)

## 2014-08-16 LAB — HOLD TUBE, BLOOD BANK

## 2014-08-16 LAB — PREPARE RBC (CROSSMATCH)

## 2014-08-16 MED ORDER — HEPARIN SOD (PORK) LOCK FLUSH 100 UNIT/ML IV SOLN
500.0000 [IU] | Freq: Every day | INTRAVENOUS | Status: AC | PRN
  Administered 2014-08-16: 500 [IU]
  Filled 2014-08-16: qty 5

## 2014-08-16 MED ORDER — SODIUM CHLORIDE 0.9 % IJ SOLN
10.0000 mL | INTRAMUSCULAR | Status: AC | PRN
  Administered 2014-08-16: 10 mL
  Filled 2014-08-16: qty 10

## 2014-08-16 MED ORDER — SODIUM CHLORIDE 0.9 % IV SOLN
250.0000 mL | Freq: Once | INTRAVENOUS | Status: AC
  Administered 2014-08-16: 250 mL via INTRAVENOUS

## 2014-08-17 ENCOUNTER — Ambulatory Visit (HOSPITAL_BASED_OUTPATIENT_CLINIC_OR_DEPARTMENT_OTHER): Payer: Medicare Other

## 2014-08-17 DIAGNOSIS — D469 Myelodysplastic syndrome, unspecified: Secondary | ICD-10-CM | POA: Diagnosis not present

## 2014-08-17 DIAGNOSIS — D638 Anemia in other chronic diseases classified elsewhere: Secondary | ICD-10-CM

## 2014-08-17 DIAGNOSIS — D509 Iron deficiency anemia, unspecified: Secondary | ICD-10-CM

## 2014-08-17 MED ORDER — LORAZEPAM 1 MG PO TABS
ORAL_TABLET | ORAL | Status: AC
  Filled 2014-08-17: qty 1

## 2014-08-17 MED ORDER — HEPARIN SOD (PORK) LOCK FLUSH 100 UNIT/ML IV SOLN
500.0000 [IU] | Freq: Every day | INTRAVENOUS | Status: AC | PRN
  Administered 2014-08-17: 500 [IU]
  Filled 2014-08-17: qty 5

## 2014-08-17 MED ORDER — SODIUM CHLORIDE 0.9 % IV SOLN
250.0000 mL | Freq: Once | INTRAVENOUS | Status: AC
  Administered 2014-08-17: 250 mL via INTRAVENOUS

## 2014-08-17 MED ORDER — SODIUM CHLORIDE 0.9 % IJ SOLN
10.0000 mL | INTRAMUSCULAR | Status: AC | PRN
  Administered 2014-08-17: 10 mL
  Filled 2014-08-17: qty 10

## 2014-08-17 MED ORDER — LORAZEPAM 1 MG PO TABS
0.5000 mg | ORAL_TABLET | Freq: Once | ORAL | Status: AC
  Administered 2014-08-17: 0.5 mg via ORAL

## 2014-08-17 NOTE — Patient Instructions (Signed)

## 2014-08-17 NOTE — Progress Notes (Signed)
Pt tolerated blood transfusion without difficulty. No reaction.

## 2014-08-18 LAB — TYPE AND SCREEN
ABO/RH(D): A POS
ANTIBODY SCREEN: NEGATIVE
UNIT DIVISION: 0
Unit division: 0

## 2014-08-23 ENCOUNTER — Ambulatory Visit (INDEPENDENT_AMBULATORY_CARE_PROVIDER_SITE_OTHER): Payer: Medicare Other | Admitting: Oncology

## 2014-08-23 ENCOUNTER — Encounter: Payer: Self-pay | Admitting: Oncology

## 2014-08-23 VITALS — BP 134/59 | HR 68 | Temp 97.7°F | Ht 62.0 in | Wt 172.3 lb

## 2014-08-23 DIAGNOSIS — R162 Hepatomegaly with splenomegaly, not elsewhere classified: Secondary | ICD-10-CM

## 2014-08-23 DIAGNOSIS — D479 Neoplasm of uncertain behavior of lymphoid, hematopoietic and related tissue, unspecified: Secondary | ICD-10-CM

## 2014-08-23 DIAGNOSIS — F329 Major depressive disorder, single episode, unspecified: Secondary | ICD-10-CM

## 2014-08-23 DIAGNOSIS — F419 Anxiety disorder, unspecified: Secondary | ICD-10-CM

## 2014-08-23 DIAGNOSIS — F418 Other specified anxiety disorders: Secondary | ICD-10-CM

## 2014-08-23 DIAGNOSIS — D469 Myelodysplastic syndrome, unspecified: Secondary | ICD-10-CM

## 2014-08-23 LAB — COMPREHENSIVE METABOLIC PANEL
ALT: 10 U/L (ref 0–35)
AST: 15 U/L (ref 0–37)
Albumin: 4.8 g/dL (ref 3.5–5.2)
Alkaline Phosphatase: 51 U/L (ref 39–117)
BILIRUBIN TOTAL: 3.6 mg/dL — AB (ref 0.2–1.2)
BUN: 23 mg/dL (ref 6–23)
CHLORIDE: 107 meq/L (ref 96–112)
CO2: 24 meq/L (ref 19–32)
Calcium: 9.2 mg/dL (ref 8.4–10.5)
Creat: 0.81 mg/dL (ref 0.50–1.10)
GLUCOSE: 87 mg/dL (ref 70–99)
Potassium: 4.4 mEq/L (ref 3.5–5.3)
Sodium: 140 mEq/L (ref 135–145)
Total Protein: 6.6 g/dL (ref 6.0–8.3)

## 2014-08-23 LAB — CBC WITH DIFFERENTIAL/PLATELET
BASOS ABS: 0 10*3/uL (ref 0.0–0.1)
Basophils Relative: 0 % (ref 0–1)
Eosinophils Absolute: 0.1 10*3/uL (ref 0.0–0.7)
Eosinophils Relative: 1 % (ref 0–5)
HEMATOCRIT: 28 % — AB (ref 36.0–46.0)
Hemoglobin: 9.1 g/dL — ABNORMAL LOW (ref 12.0–15.0)
LYMPHS PCT: 42 % (ref 12–46)
Lymphs Abs: 2.8 10*3/uL (ref 0.7–4.0)
MCH: 34 pg (ref 26.0–34.0)
MCHC: 32.5 g/dL (ref 30.0–36.0)
MCV: 104.5 fL — AB (ref 78.0–100.0)
Monocytes Absolute: 0.2 10*3/uL (ref 0.1–1.0)
Monocytes Relative: 3 % (ref 3–12)
Neutro Abs: 3.6 10*3/uL (ref 1.7–7.7)
Neutrophils Relative %: 54 % (ref 43–77)
Platelets: 226 10*3/uL (ref 150–400)
RBC: 2.68 MIL/uL — AB (ref 3.87–5.11)
RDW: 20.1 % — AB (ref 11.5–15.5)
WBC: 6.6 10*3/uL (ref 4.0–10.5)

## 2014-08-23 LAB — LACTATE DEHYDROGENASE: LDH: 166 U/L (ref 94–250)

## 2014-08-23 LAB — URIC ACID: Uric Acid, Serum: 6.3 mg/dL (ref 2.4–7.0)

## 2014-08-23 NOTE — Patient Instructions (Signed)
To lab here today Continue regular every 3 week blood counts at the cancer center Return visit with Dr Darnell Level 4 months

## 2014-08-23 NOTE — Progress Notes (Signed)
Patient ID: Carolyn Brennan, female   DOB: [birth year], 74 y.o.   MRN: [id number] Hematology and Oncology Follow Up Visit  Carolyn Brennan [id number] April 10, 1941 74 y.o. 08/23/2014 12:36 PM   Principle Diagnosis: Encounter Diagnoses  Name Primary?  [name] MDS/MPN (myelodysplastic/myeloproliferative neoplasms) Yes  . Hepatosplenomegaly on CT abdomen 11/17/12   . Iron overload due to repeated red blood cell transfusions   . Anxiety and depression   Clinical Summary: 74 year old woman with a myeloproliferative disorder with dysplastic features. She has become transfusion-dependent for red blood cells. She is requiring blood every 3-4 weeks. She failed a trial of Aranesp and Procrit. Blood counts as far back as July 2007 showed a macrocytic anemia with hemoglobin 11 g, leukocytosis with white count 13,600, and mild elevation of her platelet count of 399,000. She was referred to our office in April 2008. Initial bone marrow done by my partner Dr. Marin Olp showed a hypercellular marrow with ring sideroblasts. Routine cytogenetics were normal. FISH studies not done on that marrow. She tested negative for the JAK-2 mutation. Most recent bone marrow done 01/25/2011 showed normal cytogenetics. No evidence for the 5Q deletion on FISH. This marrow changed little compared with the initial study done at diagnosis in April of 2008. Marrow hypercellular for age, 90%, with prominent erythroid proliferation, megakaryocyte proliferation with the large and abnormal forms. No excess blasts. Normal maturation in the myeloid series. dysplastic changes in the erythroid series.  She has developed iron overload from repetitive transfusions.   Ferritin levels were climbing. After much discussion and deliberation, she agreed to a trial of Exjade started in September 2014. Peak ferritin almost 5000 in May 2014 but this correlated with an acute infectious process-staphylococcal aureus bacteremia. A more reliable baseline obtained  05/13/2013 was 2236 units. Overall she is tolerating the drug well. Ferritin levels slowly improving with most recent value down significantly to 348 on 08/16/14 . She had an eye exam prior to her visit here in March of  2015. Renal function has been stable.    Interim History:   In reviewing her CBC profile done over time, it was clear that independent of blood transfusions, hemoglobin was staying in the 7-8 gram range. I elected to establish stricter parameters for transfusion. She will not give blood unless her hemoglobin is less than 7.5 g. By spacing out the transfusions, and continuing Exjade, we have seen a significant improvement in her ferritin with most recent value down to 348 on 08/16/2014.  She tells me that she has tapered herself off Prozac and feels better than she has in a long time. She is much happier. She feels the drug was probably causing some dystonic reactions. She tells me that she has benign positional vertigo. She still gets quite anxious at time and this is relieved with a small dose of lorazepam as needed. She wants to have a glass of wine periodically and I told her this would be fine.  Medications: reviewed  Allergies: No Known Allergies  Review of Systems: See HPI Remaining ROS negative:   Physical Exam: Blood pressure 134/59, pulse 68, temperature 97.7 F (36.5 C), temperature source Oral, height 5\' 2"  (1.575 m), weight 172 lb 4.8 oz (78.155 kg), SpO2 98 %. Wt Readings from Last 3 Encounters:  08/23/14 172 lb 4.8 oz (78.155 kg)  05/03/14 173 lb 12.8 oz (78.835 kg)  12/15/13 171 lb 9.6 oz (77.837 kg)     General appearance: well nourished caucasian woman HENNT: Pharynx no erythema, exudate, mass,  or ulcer. No thyromegaly or thyroid nodules Lymph nodes: No cervical, supraclavicular, or axillary lymphadenopathy Breasts: Lungs: Clear to auscultation, resonant to percussion throughout Heart: Regular rhythm, no murmur, no gallop, no rub, no click, no  edema Abdomen: Soft, obese, nontender, normal bowel sounds, no mass, no organomegaly (despite "scanomegaly") Extremities: No edema, no calf tenderness Musculoskeletal: no joint deformities GU:  Vascular: Carotid pulses 2+, no bruits, distal pulses: Dorsalis pedis 1+ symmetric Neurologic: Alert, oriented, PERRLA,  cranial nerves grossly normal, motor strength 5 over 5, reflexes 1+ symmetric, upper body coordination normal, gait normal, Skin: No rash or ecchymosis, mildly icteric  Lab Results: CBC W/Diff    Component Value Date/Time   WBC 5.6 08/16/2014 1053   WBC 6.1 05/03/2014 1140   WBC 16.6* 04/16/2012 1858   RBC 1.86* 08/16/2014 1053   RBC 2.34* 05/03/2014 1140   RBC 2.80* 04/16/2012 1858   HGB 6.6* 08/16/2014 1053   HGB 7.5* 07/09/2014 1633   HGB 8.4* 04/16/2012 1858   HCT 22.5* 08/16/2014 1053   HCT 22.0* 07/09/2014 1633   HCT 27.2* 04/16/2012 1858   PLT 250 08/16/2014 1053   PLT 232 05/03/2014 1140   MCV 121.0* 08/16/2014 1053   MCV 101.3* 05/03/2014 1140   MCV 97.1* 04/16/2012 1858   MCH 35.5* 08/16/2014 1053   MCH 34.2* 05/03/2014 1140   MCH 30.0 04/16/2012 1858   MCHC 29.3* 08/16/2014 1053   MCHC 33.8 05/03/2014 1140   MCHC 30.9* 04/16/2012 1858   RDW 20.5* 08/16/2014 1053   RDW 21.8* 05/03/2014 1140   LYMPHSABS 2.2 08/16/2014 1053   LYMPHSABS 2.3 05/03/2014 1140   MONOABS 0.2 08/16/2014 1053   MONOABS 0.2 05/03/2014 1140   EOSABS 0.0 08/16/2014 1053   EOSABS 0.1 05/03/2014 1140   BASOSABS 0.0 08/16/2014 1053   BASOSABS 0.0 05/03/2014 1140     Chemistry      Component Value Date/Time   NA 139 07/09/2014 1633   NA 141 05/26/2014 1151   K 4.1 07/09/2014 1633   K 4.4 05/26/2014 1151   CL 103 07/09/2014 1633   CL 103 09/01/2012 1453   CO2 27 05/26/2014 1151   CO2 24 07/21/2013 1550   BUN 21 07/09/2014 1633   BUN 20.0 05/26/2014 1151   CREATININE 1.30* 07/09/2014 1633   CREATININE 0.9 05/26/2014 1151      Component Value Date/Time   CALCIUM 9.2  05/26/2014 1151   CALCIUM 9.8 07/21/2013 1550   ALKPHOS 53 05/26/2014 1151   ALKPHOS 75 07/21/2013 1550   AST 16 05/26/2014 1151   AST 35 07/21/2013 1550   ALT 14 05/26/2014 1151   ALT 30 07/21/2013 1550   BILITOT 3.16* 05/26/2014 1151   BILITOT 1.8* 07/21/2013 1550       Radiological Studies: No results found.  Impression:  #1. 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   #2. 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         See discussion above  #3. Iron overload from poly transfusion        Improving with decreased transfusions and Exjade iron chelator.  Continue Exjade.   #4. Idiopathic Staphylococcus aureus sepsis. May 2014   #5. Status post Mohs surgery to excise a basal cell carcinoma bridge of nose   #6. Hepatosplenomegaly secondary to her underlying myeloproliferative disorder   #7. Chronic anxiety and depression  CC: Patient Care Team: Marjorie Smolder, MD as PCP - General (Family Medicine) Annia Belt, MD as Consulting Physician (Hematology  and Oncology)   Annia Belt, MD 2/8/201612:36 PM

## 2014-08-25 ENCOUNTER — Telehealth: Payer: Self-pay | Admitting: *Deleted

## 2014-08-25 NOTE — Telephone Encounter (Signed)
-----   Message from Annia Belt, MD sent at 08/24/2014  5:52 PM EST ----- Call pt: hemoglobin was 9.1 on Monday

## 2014-08-25 NOTE — Telephone Encounter (Signed)
Pt called - no answer. Left message Hgb 9.1 per Dr Beryle Beams. And to call if she has any questions.

## 2014-08-30 ENCOUNTER — Telehealth: Payer: Self-pay | Admitting: Oncology

## 2014-08-30 NOTE — Telephone Encounter (Signed)
returned pt call re standing labs order - JG pt. per pt she has labs drawn at 2/8 visit w/JG and was to continue standing labs with [place]. per standing lab order scheduled lab appts q3w from 2/8 - next appt 2/9. pt will get new schedule 2/29 and per pt she is to see JG again in 94mos but office will call her with that appt.

## 2014-09-13 ENCOUNTER — Other Ambulatory Visit (HOSPITAL_BASED_OUTPATIENT_CLINIC_OR_DEPARTMENT_OTHER): Payer: Medicare Other

## 2014-09-13 DIAGNOSIS — D469 Myelodysplastic syndrome, unspecified: Secondary | ICD-10-CM

## 2014-09-13 DIAGNOSIS — D649 Anemia, unspecified: Secondary | ICD-10-CM

## 2014-09-13 DIAGNOSIS — F411 Generalized anxiety disorder: Secondary | ICD-10-CM

## 2014-09-13 DIAGNOSIS — D479 Neoplasm of uncertain behavior of lymphoid, hematopoietic and related tissue, unspecified: Secondary | ICD-10-CM

## 2014-09-13 LAB — TECHNOLOGIST REVIEW

## 2014-09-13 LAB — CBC WITH DIFFERENTIAL/PLATELET
BASO%: 0.6 % (ref 0.0–2.0)
Basophils Absolute: 0 10*3/uL (ref 0.0–0.1)
EOS ABS: 0 10*3/uL (ref 0.0–0.5)
EOS%: 0.5 % (ref 0.0–7.0)
HCT: 25.3 % — ABNORMAL LOW (ref 34.8–46.6)
HEMOGLOBIN: 7.5 g/dL — AB (ref 11.6–15.9)
LYMPH%: 43 % (ref 14.0–49.7)
MCH: 32.5 pg (ref 25.1–34.0)
MCHC: 29.7 g/dL — ABNORMAL LOW (ref 31.5–36.0)
MCV: 109.4 fL — ABNORMAL HIGH (ref 79.5–101.0)
MONO#: 0 10*3/uL — ABNORMAL LOW (ref 0.1–0.9)
MONO%: 0.7 % (ref 0.0–14.0)
NEUT%: 55.2 % (ref 38.4–76.8)
NEUTROS ABS: 3.8 10*3/uL (ref 1.5–6.5)
RBC: 2.31 10*6/uL — ABNORMAL LOW (ref 3.70–5.45)
RDW: 25.1 % — AB (ref 11.2–14.5)
WBC: 6.8 10*3/uL (ref 3.9–10.3)
lymph#: 2.9 10*3/uL (ref 0.9–3.3)
nRBC: 1 % — ABNORMAL HIGH (ref 0–0)

## 2014-09-13 LAB — HOLD TUBE, BLOOD BANK

## 2014-10-04 ENCOUNTER — Other Ambulatory Visit: Payer: Self-pay | Admitting: Oncology

## 2014-10-04 ENCOUNTER — Ambulatory Visit (HOSPITAL_COMMUNITY)
Admission: RE | Admit: 2014-10-04 | Discharge: 2014-10-04 | Disposition: A | Discharge status: Home / Self Care | Payer: Medicare Other | Source: Ambulatory Visit | Attending: Oncology | Admitting: Oncology

## 2014-10-04 ENCOUNTER — Other Ambulatory Visit: Payer: Self-pay | Admitting: *Deleted

## 2014-10-04 ENCOUNTER — Other Ambulatory Visit (HOSPITAL_BASED_OUTPATIENT_CLINIC_OR_DEPARTMENT_OTHER): Payer: Medicare Other

## 2014-10-04 ENCOUNTER — Telehealth: Payer: Self-pay | Admitting: *Deleted

## 2014-10-04 DIAGNOSIS — D469 Myelodysplastic syndrome, unspecified: Secondary | ICD-10-CM

## 2014-10-04 DIAGNOSIS — D649 Anemia, unspecified: Secondary | ICD-10-CM

## 2014-10-04 DIAGNOSIS — F411 Generalized anxiety disorder: Secondary | ICD-10-CM

## 2014-10-04 DIAGNOSIS — D479 Neoplasm of uncertain behavior of lymphoid, hematopoietic and related tissue, unspecified: Secondary | ICD-10-CM

## 2014-10-04 LAB — CBC WITH DIFFERENTIAL/PLATELET
BASO%: 4.6 % — ABNORMAL HIGH (ref 0.0–2.0)
Basophils Absolute: 0.3 10*3/uL — ABNORMAL HIGH (ref 0.0–0.1)
EOS%: 1.1 % (ref 0.0–7.0)
Eosinophils Absolute: 0.1 10*3/uL (ref 0.0–0.5)
HEMATOCRIT: 24.1 % — AB (ref 34.8–46.6)
HEMOGLOBIN: 7.3 g/dL — AB (ref 11.6–15.9)
LYMPH#: 3 10*3/uL (ref 0.9–3.3)
LYMPH%: 49.7 % (ref 14.0–49.7)
MCH: 33.9 pg (ref 25.1–34.0)
MCHC: 30 g/dL — AB (ref 31.5–36.0)
MCV: 113 fL — ABNORMAL HIGH (ref 79.5–101.0)
MONO#: 0 10*3/uL — ABNORMAL LOW (ref 0.1–0.9)
MONO%: 0.3 % (ref 0.0–14.0)
NEUT#: 2.7 10*3/uL (ref 1.5–6.5)
NEUT%: 44.3 % (ref 38.4–76.8)
Platelets: 227 10*3/uL (ref 145–400)
RBC: 2.14 10*6/uL — ABNORMAL LOW (ref 3.70–5.45)
RDW: 25.8 % — AB (ref 11.2–14.5)
WBC: 6 10*3/uL (ref 3.9–10.3)

## 2014-10-04 LAB — HOLD TUBE, BLOOD BANK

## 2014-10-04 NOTE — Telephone Encounter (Signed)
Patient called to ask how many units of blood she will receive tomorrow morning.  Informed her she has two units ordered.

## 2014-10-05 ENCOUNTER — Ambulatory Visit (HOSPITAL_BASED_OUTPATIENT_CLINIC_OR_DEPARTMENT_OTHER): Payer: Medicare Other

## 2014-10-05 ENCOUNTER — Other Ambulatory Visit: Payer: Self-pay | Admitting: *Deleted

## 2014-10-05 VITALS — BP 125/47 | HR 75 | Temp 97.7°F | Resp 18

## 2014-10-05 DIAGNOSIS — D469 Myelodysplastic syndrome, unspecified: Secondary | ICD-10-CM | POA: Diagnosis not present

## 2014-10-05 LAB — PREPARE RBC (CROSSMATCH)

## 2014-10-05 MED ORDER — SODIUM CHLORIDE 0.9 % IV SOLN
250.0000 mL | Freq: Once | INTRAVENOUS | Status: AC
  Administered 2014-10-05: 250 mL via INTRAVENOUS

## 2014-10-05 MED ORDER — HEPARIN SOD (PORK) LOCK FLUSH 100 UNIT/ML IV SOLN
500.0000 [IU] | Freq: Every day | INTRAVENOUS | Status: AC | PRN
  Administered 2014-10-05: 500 [IU]
  Filled 2014-10-05: qty 5

## 2014-10-05 MED ORDER — SODIUM CHLORIDE 0.9 % IJ SOLN
10.0000 mL | INTRAMUSCULAR | Status: AC | PRN
  Administered 2014-10-05: 10 mL
  Filled 2014-10-05: qty 10

## 2014-10-05 NOTE — Patient Instructions (Signed)

## 2014-10-06 LAB — TYPE AND SCREEN
ABO/RH(D): A POS
Antibody Screen: NEGATIVE
Unit division: 0

## 2014-10-25 ENCOUNTER — Other Ambulatory Visit (HOSPITAL_BASED_OUTPATIENT_CLINIC_OR_DEPARTMENT_OTHER): Payer: Medicare Other

## 2014-10-25 DIAGNOSIS — D469 Myelodysplastic syndrome, unspecified: Secondary | ICD-10-CM

## 2014-10-25 DIAGNOSIS — D649 Anemia, unspecified: Secondary | ICD-10-CM

## 2014-10-25 DIAGNOSIS — F411 Generalized anxiety disorder: Secondary | ICD-10-CM

## 2014-10-25 LAB — HOLD TUBE, BLOOD BANK

## 2014-10-25 LAB — CBC WITH DIFFERENTIAL/PLATELET
BASO%: 2.7 % — AB (ref 0.0–2.0)
Basophils Absolute: 0.2 10*3/uL — ABNORMAL HIGH (ref 0.0–0.1)
EOS ABS: 0 10*3/uL (ref 0.0–0.5)
EOS%: 0.6 % (ref 0.0–7.0)
HEMATOCRIT: 25.4 % — AB (ref 34.8–46.6)
HEMOGLOBIN: 7.8 g/dL — AB (ref 11.6–15.9)
LYMPH#: 3 10*3/uL (ref 0.9–3.3)
LYMPH%: 40.4 % (ref 14.0–49.7)
MCH: 34.2 pg — AB (ref 25.1–34.0)
MCHC: 30.9 g/dL — ABNORMAL LOW (ref 31.5–36.0)
MCV: 110.7 fL — AB (ref 79.5–101.0)
MONO#: 0.1 10*3/uL (ref 0.1–0.9)
MONO%: 0.7 % (ref 0.0–14.0)
NEUT%: 55.6 % (ref 38.4–76.8)
NEUTROS ABS: 4.1 10*3/uL (ref 1.5–6.5)
PLATELETS: 217 10*3/uL (ref 145–400)
RBC: 2.29 10*6/uL — ABNORMAL LOW (ref 3.70–5.45)
RDW: 26.2 % — AB (ref 11.2–14.5)
WBC: 7.4 10*3/uL (ref 3.9–10.3)

## 2014-11-08 IMAGING — US IR FLUORO GUIDE CV LINE*L*
1 series · 1 of 1 positions shown · non-contrast
Comparison: none

CLINICAL DATA: 71-year-old with myeloproliferative disorder with
dysplastic features.  The patient needs transfusion therapy.

[Series 1: sp fluoro guide cv line*left* · 1 of 1 slices shown]
[im 1/1]
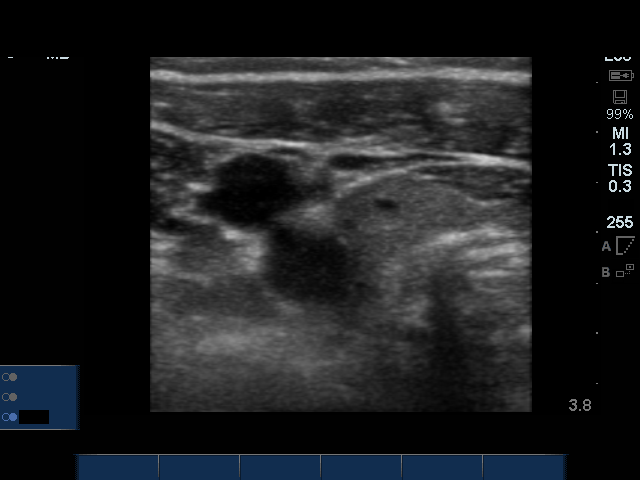

[1 of 1 positions shown; findings below may reference images not displayed]

FLUOROSCOPIC AND ULTRASOUND GUIDED PLACEMENT OF A SUBCUTANEOUS
PORT.

Medications:Versed 2 mg, Fentanyl 100 mcg.  Ancef 1 gm.  As
antibiotic prophylaxis, Ancef  was ordered pre-procedure and
administered intravenously within one hour of incision.

Moderate sedation time:45 minutes

Fluoroscopy time: 3.4 minutes

Procedure:  The risks of the procedure were explained to the
patient.  Informed consent was obtained.  Patient was placed supine
on the interventional table.  Ultrasound confirmed a patent right
internal jugular vein.  The right chest and neck were cleaned with
a skin antiseptic and a sterile drape was placed.  Maximal barrier
sterile technique was utilized including caps, mask, sterile gowns,
sterile gloves, sterile drape, hand hygiene and skin antiseptic.
The right neck was anesthetized with 1% lidocaine.  Small incision
was made in the right neck with a blade.  Micropuncture set was
placed in the right IJ with ultrasound guidance.  The micropuncture
wire was used for measurement purposes.  The right chest was
anesthetized with 1% lidocaine with epinephrine.  #15 blade was
used to make an incision and a subcutaneous port pocket was formed.
8 french Power Port was assembled.  Subcutaneous tunnel was formed
with a stiff tunneling device.  The port catheter was brought
through the subcutaneous tunnel.  The port was placed in the
subcutaneous pocket.  The micropuncture set was exchanged for a
peel-away sheath.  The catheter was placed through the peel-away
sheath and the tip was positioned in the superior vena cava.
Catheter placement was confirmed with fluoroscopy.  The port was
accessed and flushed with heparinized saline.  The port pocket was
closed using two layers of absorbable sutures and Dermabond.  The
vein skin site was closed using a single layer of absorbable suture
and Dermabond.  Sterile dressings were applied.  Patient tolerated
the procedure well without an immediate complication.  Ultrasound
and fluoroscopic images were taken and saved for this procedure.

Complications: None
IMPRESSION: Placement of a subcutaneous port device.  The catheter
tip is in the superior vena cava and ready to be used.

## 2014-11-12 ENCOUNTER — Other Ambulatory Visit: Payer: Self-pay | Admitting: *Deleted

## 2014-11-14 MED ORDER — DEFERASIROX 360 MG PO TABS: 1080.0000 mg | ORAL_TABLET | Freq: Every day | ORAL | Status: DC

## 2014-11-15 ENCOUNTER — Other Ambulatory Visit: Payer: Self-pay | Admitting: Oncology

## 2014-11-15 ENCOUNTER — Other Ambulatory Visit: Payer: Self-pay | Admitting: *Deleted

## 2014-11-15 ENCOUNTER — Other Ambulatory Visit (HOSPITAL_BASED_OUTPATIENT_CLINIC_OR_DEPARTMENT_OTHER): Payer: Medicare Other

## 2014-11-15 ENCOUNTER — Ambulatory Visit (HOSPITAL_COMMUNITY)
Admission: RE | Admit: 2014-11-15 | Discharge: 2014-11-15 | Disposition: A | Discharge status: Home / Self Care | Payer: Medicare Other | Source: Ambulatory Visit | Attending: Oncology | Admitting: Oncology

## 2014-11-15 ENCOUNTER — Other Ambulatory Visit: Payer: BLUE CROSS/BLUE SHIELD

## 2014-11-15 DIAGNOSIS — D649 Anemia, unspecified: Secondary | ICD-10-CM

## 2014-11-15 DIAGNOSIS — D469 Myelodysplastic syndrome, unspecified: Secondary | ICD-10-CM | POA: Diagnosis present

## 2014-11-15 LAB — CBC WITH DIFFERENTIAL/PLATELET
BASO%: 2.5 % — ABNORMAL HIGH (ref 0.0–2.0)
BASOS ABS: 0.1 10*3/uL (ref 0.0–0.1)
EOS ABS: 0 10*3/uL (ref 0.0–0.5)
EOS%: 0.8 % (ref 0.0–7.0)
HEMATOCRIT: 22.2 % — AB (ref 34.8–46.6)
HEMOGLOBIN: 6.8 g/dL — AB (ref 11.6–15.9)
LYMPH%: 43.8 % (ref 14.0–49.7)
MCH: 35 pg — AB (ref 25.1–34.0)
MCHC: 30.8 g/dL — ABNORMAL LOW (ref 31.5–36.0)
MCV: 113.6 fL — ABNORMAL HIGH (ref 79.5–101.0)
MONO#: 0 10*3/uL — ABNORMAL LOW (ref 0.1–0.9)
MONO%: 0.5 % (ref 0.0–14.0)
NEUT%: 52.4 % (ref 38.4–76.8)
NEUTROS ABS: 3.1 10*3/uL (ref 1.5–6.5)
RBC: 1.95 10*6/uL — ABNORMAL LOW (ref 3.70–5.45)
RDW: 24.5 % — ABNORMAL HIGH (ref 11.2–14.5)
WBC: 6 10*3/uL (ref 3.9–10.3)
lymph#: 2.6 10*3/uL (ref 0.9–3.3)

## 2014-11-15 MED ORDER — DEFERASIROX 360 MG PO TABS: 1080.0000 mg | ORAL_TABLET | Freq: Every day | ORAL | Status: DC

## 2014-11-15 NOTE — Telephone Encounter (Signed)
error 

## 2014-11-15 NOTE — Telephone Encounter (Signed)
Ena Dawley with [facility] called requesting refill for Jadenu.  Observed order was sent on 11-14-2014 and received.  [facility] Dawley learned [place] [facility] and needs to go to [facility].  Call transferred to [phone].

## 2014-11-16 ENCOUNTER — Ambulatory Visit (HOSPITAL_BASED_OUTPATIENT_CLINIC_OR_DEPARTMENT_OTHER): Payer: Medicare Other

## 2014-11-16 VITALS — BP 139/55 | HR 76 | Temp 98.0°F | Resp 16

## 2014-11-16 DIAGNOSIS — D469 Myelodysplastic syndrome, unspecified: Secondary | ICD-10-CM | POA: Diagnosis present

## 2014-11-16 DIAGNOSIS — D649 Anemia, unspecified: Secondary | ICD-10-CM

## 2014-11-16 DIAGNOSIS — C946 Myelodysplastic disease, not classified: Secondary | ICD-10-CM

## 2014-11-16 MED ORDER — SODIUM CHLORIDE 0.9 % IJ SOLN
10.0000 mL | INTRAMUSCULAR | Status: AC | PRN
  Administered 2014-11-16: 10 mL
  Filled 2014-11-16: qty 10

## 2014-11-16 MED ORDER — HEPARIN SOD (PORK) LOCK FLUSH 100 UNIT/ML IV SOLN
500.0000 [IU] | Freq: Every day | INTRAVENOUS | Status: AC | PRN
  Administered 2014-11-16: 500 [IU]
  Filled 2014-11-16: qty 5

## 2014-11-16 MED ORDER — SODIUM CHLORIDE 0.9 % IV SOLN
250.0000 mL | Freq: Once | INTRAVENOUS | Status: AC
  Administered 2014-11-16: 250 mL via INTRAVENOUS

## 2014-11-16 MED ORDER — LORAZEPAM 1 MG PO TABS
ORAL_TABLET | ORAL | Status: AC
  Filled 2014-11-16: qty 1

## 2014-11-16 MED ORDER — LORAZEPAM 1 MG PO TABS
0.5000 mg | ORAL_TABLET | Freq: Once | ORAL | Status: AC
  Administered 2014-11-16: 0.5 mg via ORAL

## 2014-11-16 NOTE — Patient Instructions (Signed)

## 2014-11-16 NOTE — Progress Notes (Signed)
0815-Pt requesting Ativan 0.5 MG pre blood transfusion.  Dr. Beryle Beams notified and order received to give pt Ativan 0.5 MG PO.

## 2014-11-17 LAB — TYPE AND SCREEN
ABO/RH(D): A POS
Antibody Screen: NEGATIVE
UNIT DIVISION: 0
Unit division: 0

## 2014-11-24 ENCOUNTER — Telehealth: Payer: Self-pay | Admitting: Oncology

## 2014-11-24 ENCOUNTER — Other Ambulatory Visit: Payer: Self-pay | Admitting: Oncology

## 2014-11-24 DIAGNOSIS — D469 Myelodysplastic syndrome, unspecified: Secondary | ICD-10-CM

## 2014-11-24 DIAGNOSIS — D6489 Other specified anemias: Secondary | ICD-10-CM

## 2014-11-24 NOTE — Telephone Encounter (Signed)
Left messag to confirm lab appointment May thru July

## 2014-12-06 ENCOUNTER — Other Ambulatory Visit (HOSPITAL_BASED_OUTPATIENT_CLINIC_OR_DEPARTMENT_OTHER): Payer: Medicare Other

## 2014-12-06 ENCOUNTER — Other Ambulatory Visit: Payer: BLUE CROSS/BLUE SHIELD

## 2014-12-06 DIAGNOSIS — D469 Myelodysplastic syndrome, unspecified: Secondary | ICD-10-CM | POA: Diagnosis present

## 2014-12-06 DIAGNOSIS — D6489 Other specified anemias: Secondary | ICD-10-CM

## 2014-12-06 LAB — CBC WITH DIFFERENTIAL/PLATELET
BASO%: 2.1 % — ABNORMAL HIGH (ref 0.0–2.0)
BASOS ABS: 0.1 10*3/uL (ref 0.0–0.1)
EOS%: 0.7 % (ref 0.0–7.0)
Eosinophils Absolute: 0 10*3/uL (ref 0.0–0.5)
HCT: 25.4 % — ABNORMAL LOW (ref 34.8–46.6)
HGB: 8.2 g/dL — ABNORMAL LOW (ref 11.6–15.9)
LYMPH#: 3.1 10*3/uL (ref 0.9–3.3)
LYMPH%: 48.7 % (ref 14.0–49.7)
MCH: 33.7 pg (ref 25.1–34.0)
MCHC: 32.3 g/dL (ref 31.5–36.0)
MCV: 104.4 fL — ABNORMAL HIGH (ref 79.5–101.0)
MONO#: 0 10*3/uL — ABNORMAL LOW (ref 0.1–0.9)
MONO%: 0.4 % (ref 0.0–14.0)
NEUT#: 3.1 10*3/uL (ref 1.5–6.5)
NEUT%: 48.1 % (ref 38.4–76.8)
PLATELETS: 212 10*3/uL (ref 145–400)
RBC: 2.44 10*6/uL — ABNORMAL LOW (ref 3.70–5.45)
RDW: 29.2 % — ABNORMAL HIGH (ref 11.2–14.5)
WBC: 6.4 10*3/uL (ref 3.9–10.3)

## 2014-12-06 LAB — HOLD TUBE, BLOOD BANK

## 2014-12-20 ENCOUNTER — Ambulatory Visit: Payer: BLUE CROSS/BLUE SHIELD | Admitting: Oncology

## 2014-12-27 ENCOUNTER — Other Ambulatory Visit: Payer: BLUE CROSS/BLUE SHIELD

## 2014-12-27 ENCOUNTER — Encounter: Payer: Self-pay | Admitting: Oncology

## 2014-12-27 ENCOUNTER — Ambulatory Visit (INDEPENDENT_AMBULATORY_CARE_PROVIDER_SITE_OTHER): Payer: Medicare Other | Admitting: Oncology

## 2014-12-27 ENCOUNTER — Other Ambulatory Visit (HOSPITAL_BASED_OUTPATIENT_CLINIC_OR_DEPARTMENT_OTHER): Payer: Medicare Other

## 2014-12-27 DIAGNOSIS — D6489 Other specified anemias: Secondary | ICD-10-CM

## 2014-12-27 DIAGNOSIS — D469 Myelodysplastic syndrome, unspecified: Secondary | ICD-10-CM

## 2014-12-27 DIAGNOSIS — Z9889 Other specified postprocedural states: Secondary | ICD-10-CM | POA: Diagnosis not present

## 2014-12-27 DIAGNOSIS — Z8619 Personal history of other infectious and parasitic diseases: Secondary | ICD-10-CM

## 2014-12-27 DIAGNOSIS — F418 Other specified anxiety disorders: Secondary | ICD-10-CM | POA: Diagnosis not present

## 2014-12-27 DIAGNOSIS — R162 Hepatomegaly with splenomegaly, not elsewhere classified: Secondary | ICD-10-CM | POA: Diagnosis not present

## 2014-12-27 DIAGNOSIS — D471 Chronic myeloproliferative disease: Secondary | ICD-10-CM

## 2014-12-27 DIAGNOSIS — D649 Anemia, unspecified: Secondary | ICD-10-CM

## 2014-12-27 LAB — COMPREHENSIVE METABOLIC PANEL (CC13)
ALK PHOS: 63 U/L (ref 40–150)
ALT: 10 U/L (ref 0–55)
AST: 16 U/L (ref 5–34)
Albumin: 4.5 g/dL (ref 3.5–5.0)
Anion Gap: 10 mEq/L (ref 3–11)
BILIRUBIN TOTAL: 3.21 mg/dL — AB (ref 0.20–1.20)
BUN: 18.3 mg/dL (ref 7.0–26.0)
CHLORIDE: 108 meq/L (ref 98–109)
CO2: 21 mEq/L — ABNORMAL LOW (ref 22–29)
Calcium: 8.8 mg/dL (ref 8.4–10.4)
Creatinine: 0.8 mg/dL (ref 0.6–1.1)
EGFR: 76 mL/min/{1.73_m2} — ABNORMAL LOW (ref >90)
Glucose: 102 mg/dl (ref 70–140)
POTASSIUM: 4.1 meq/L (ref 3.5–5.1)
Sodium: 139 mEq/L (ref 136–145)
Total Protein: 6.8 g/dL (ref 6.4–8.3)

## 2014-12-27 LAB — CBC WITH DIFFERENTIAL/PLATELET
BASO%: 0.3 % (ref 0.0–2.0)
BASOS ABS: 0 10*3/uL (ref 0.0–0.1)
EOS ABS: 0.1 10*3/uL (ref 0.0–0.5)
EOS%: 0.9 % (ref 0.0–7.0)
HCT: 24 % — ABNORMAL LOW (ref 34.8–46.6)
HEMOGLOBIN: 7.8 g/dL — AB (ref 11.6–15.9)
LYMPH%: 45.6 % (ref 14.0–49.7)
MCH: 35.3 pg — AB (ref 25.1–34.0)
MCHC: 32.5 g/dL (ref 31.5–36.0)
MCV: 108.5 fL — ABNORMAL HIGH (ref 79.5–101.0)
MONO#: 0 10*3/uL — ABNORMAL LOW (ref 0.1–0.9)
MONO%: 0.2 % (ref 0.0–14.0)
NEUT%: 53 % (ref 38.4–76.8)
NEUTROS ABS: 3.1 10*3/uL (ref 1.5–6.5)
PLATELETS: 186 10*3/uL (ref 145–400)
RBC: 2.21 10*6/uL — ABNORMAL LOW (ref 3.70–5.45)
RDW: 29.5 % — AB (ref 11.2–14.5)
WBC: 5.8 10*3/uL (ref 3.9–10.3)
lymph#: 2.7 10*3/uL (ref 0.9–3.3)

## 2014-12-27 LAB — FERRITIN CHCC: FERRITIN: 343 ng/mL — AB (ref 9–269)

## 2014-12-27 LAB — HOLD TUBE, BLOOD BANK

## 2014-12-27 NOTE — Progress Notes (Signed)
Patient ID: Carolyn Brennan, female   DOB: [birth year], 74 y.o.   MRN: [id number] Hematology and Oncology Follow Up Visit  Carolyn Brennan [id number] 02/13/1941 74 y.o. 12/27/2014 6:29 PM   Principle Diagnosis: Encounter Diagnoses  Name Primary?  . Iron overload due to repeated red blood cell transfusions Yes  . MDS/MPN (myelodysplastic/myeloproliferative neoplasms)   . Anemia, unspecified anemia type   Clinical Summary: 74 year old woman with a myeloproliferative disorder with dysplastic features. She has become transfusion-dependent for red blood cells. She is requiring blood every 3-4 weeks. She failed a trial of Aranesp and Procrit. Blood counts as far back as July 2007 showed a macrocytic anemia with hemoglobin 11 g, leukocytosis with white count 13,600, and mild elevation of her platelet count of 399,000. She was referred to our office in April 2008. Initial bone marrow done by my partner Dr. Marin Olp showed a hypercellular marrow with ring sideroblasts. Routine cytogenetics were normal. FISH studies not done on that marrow. She tested negative for the JAK-2 mutation. Most recent bone marrow done 01/25/2011 showed normal cytogenetics. No evidence for the 5Q deletion on FISH. This marrow changed little compared with the initial study done at diagnosis in April of 2008. Marrow hypercellular for age, 90%, with prominent erythroid proliferation, megakaryocyte proliferation with the large and abnormal forms. No excess blasts. Normal maturation in the myeloid series. dysplastic changes in the erythroid series.  She has developed iron overload from repetitive transfusions.  Ferritin levels were climbing. After much discussion and deliberation, she agreed to a trial of Exjade started in September 2014. Peak ferritin almost 5000 in May 2014 but this correlated with an acute infectious process-staphylococcal aureus bacteremia. A more reliable baseline obtained 05/13/2013 was 2236 units. Overall she is  tolerating the drug well. She was recently changed to the more GI user friendly reformulation of the drug brand-name  Jadenu. Ferritin levels have improved significantly with most recent value down significantly to 348 on 08/16/14 . She had an eye exam prior to her visit here in March of 2015. Renal function has been stable.   Interim History:   She has had no interim medical problems. I have tried to educate her that she does not need to have a blood transfusion just because she is tired. I am trying not to give her blood unless the hemoglobin falls below 7 g. With this strategy and with the iron chelating agent, ferritin levels have plateaued in a safe range. She stopped her Prozac. She states she is more irritable but she is much happier. She feels she is thinking more clearly. No interim infections.  Medications: reviewed  Allergies: No Known Allergies  Review of Systems: See history of present illness   Remaining ROS negative:   Physical Exam: Blood pressure 139/42, pulse 73, temperature 97.6 F (36.4 C), resp. rate 20, height 5\' 2"  (1.575 m), weight 175 lb 4.8 oz (79.516 kg), SpO2 98 %. Wt Readings from Last 3 Encounters:  12/27/14 175 lb 4.8 oz (79.516 kg)  08/23/14 172 lb 4.8 oz (78.155 kg)  05/03/14 173 lb 12.8 oz (78.835 kg)     General appearance: Well-nourished Caucasian woman HENNT: Pharynx no erythema, exudate, mass, or ulcer. No thyromegaly or thyroid nodules Lymph nodes: No cervical, supraclavicular, or axillary lymphadenopathy Breasts: Bilateral scars from previous reduction mammoplasties. Lungs: Clear to auscultation, resonant to percussion throughout Heart: Regular rhythm, no murmur, no gallop, no rub, no click, no edema Abdomen: Soft, nontender, normal bowel sounds, spleen 8 cm below left  costal margin, liver about 6 cm below right costal margin and crosses the midline. Vertical scar from umbilicus to pubis. Extremities: No edema, no calf  tenderness Musculoskeletal: no joint deformities GU:  Vascular: Carotid pulses 2+, no bruits, distal pulses: Dorsalis pedis 1+ symmetric Neurologic: Alert, oriented, PERRLA, , cranial nerves grossly normal, motor strength 5 over 5, reflexes 1+ symmetric, upper body coordination normal, gait normal, Skin: No rash or ecchymosis, sallow but anicteric.  Lab Results: CBC W/Diff    Component Value Date/Time   WBC 5.8 12/27/2014 1113   WBC 6.6 08/23/2014 1153   WBC 16.6* 04/16/2012 1858   RBC 2.21* 12/27/2014 1113   RBC 2.68* 08/23/2014 1153   RBC 2.80* 04/16/2012 1858   HGB 7.8* 12/27/2014 1113   HGB 9.1* 08/23/2014 1153   HGB 8.4* 04/16/2012 1858   HCT 24.0* 12/27/2014 1113   HCT 28.0* 08/23/2014 1153   HCT 27.2* 04/16/2012 1858   PLT 186 12/27/2014 1113   PLT 226 08/23/2014 1153   MCV 108.5* 12/27/2014 1113   MCV 104.5* 08/23/2014 1153   MCV 97.1* 04/16/2012 1858   MCH 35.3* 12/27/2014 1113   MCH 34.0 08/23/2014 1153   MCH 30.0 04/16/2012 1858   MCHC 32.5 12/27/2014 1113   MCHC 32.5 08/23/2014 1153   MCHC 30.9* 04/16/2012 1858   RDW 29.5* 12/27/2014 1113   RDW 20.1* 08/23/2014 1153   LYMPHSABS 2.7 12/27/2014 1113   LYMPHSABS 2.8 08/23/2014 1153   MONOABS 0.0* 12/27/2014 1113   MONOABS 0.2 08/23/2014 1153   EOSABS 0.1 12/27/2014 1113   EOSABS 0.1 08/23/2014 1153   BASOSABS 0.0 12/27/2014 1113   BASOSABS 0.0 08/23/2014 1153     Chemistry      Component Value Date/Time   NA 139 12/27/2014 1112   NA 140 08/23/2014 1153   K 4.1 12/27/2014 1112   K 4.4 08/23/2014 1153   CL 107 08/23/2014 1153   CL 103 09/01/2012 1453   CO2 21* 12/27/2014 1112   CO2 24 08/23/2014 1153   BUN 18.3 12/27/2014 1112   BUN 23 08/23/2014 1153   CREATININE 0.8 12/27/2014 1112   CREATININE 0.81 08/23/2014 1153   CREATININE 1.30* 07/09/2014 1633      Component Value Date/Time   CALCIUM 8.8 12/27/2014 1112   CALCIUM 9.2 08/23/2014 1153   ALKPHOS 63 12/27/2014 1112   ALKPHOS 51 08/23/2014  1153   AST 16 12/27/2014 1112   AST 15 08/23/2014 1153   ALT 10 12/27/2014 1112   ALT 10 08/23/2014 1153   BILITOT 3.21* 12/27/2014 1112   BILITOT 3.6* 08/23/2014 1153    Ferritin 343-stable, 12/27/2014 Creatinine improved down to 0.8 Bilirubin persistent mild elevation 3.2. With 3.6 in February of this year   Radiological Studies: No results found.  Impression:  #1. 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  Continue to monitor CBCs on an every three-week basis. Transfuse for hemoglobin 7 g or below.  #2. 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   See discussion above  #3. 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  She wants to change every other day schedule. I think this is okay.  #4. Idiopathic Staphylococcus aureus sepsis. May 2014   #5. Status post Mohs surgery to excise a basal cell carcinoma bridge of nose   #6. Hepatosplenomegaly secondary to her underlying myeloproliferative disorder   #7. Chronic anxiety and depression   CC: Patient Care Team: Darcus Austin, MD as PCP -  General (Family Medicine) Annia Belt, MD as Consulting Physician (Hematology and Oncology)   Annia Belt, MD 6/13/20166:29 PM

## 2014-12-27 NOTE — Patient Instructions (Signed)
Continue lab every 3 weeks at the cancer center Return visit with Dr Darnell Level 3-4 months

## 2014-12-28 ENCOUNTER — Telehealth: Payer: Self-pay | Admitting: Oncology

## 2014-12-28 NOTE — Telephone Encounter (Signed)
Patient called in as she saw dr Beryle Beams and needs labs q 3weeks,done   anne

## 2015-01-14 ENCOUNTER — Encounter: Payer: Self-pay | Admitting: Oncology

## 2015-01-18 ENCOUNTER — Other Ambulatory Visit: Payer: BLUE CROSS/BLUE SHIELD

## 2015-01-20 ENCOUNTER — Telehealth: Payer: Self-pay | Admitting: *Deleted

## 2015-01-20 ENCOUNTER — Other Ambulatory Visit: Payer: BLUE CROSS/BLUE SHIELD

## 2015-01-20 ENCOUNTER — Other Ambulatory Visit (HOSPITAL_BASED_OUTPATIENT_CLINIC_OR_DEPARTMENT_OTHER): Payer: Medicare Other

## 2015-01-20 DIAGNOSIS — D471 Chronic myeloproliferative disease: Secondary | ICD-10-CM

## 2015-01-20 DIAGNOSIS — D469 Myelodysplastic syndrome, unspecified: Secondary | ICD-10-CM

## 2015-01-20 DIAGNOSIS — D6489 Other specified anemias: Secondary | ICD-10-CM

## 2015-01-20 LAB — CBC WITH DIFFERENTIAL/PLATELET
BASO%: 0.3 % (ref 0.0–2.0)
Basophils Absolute: 0 10*3/uL (ref 0.0–0.1)
EOS%: 0.4 % (ref 0.0–7.0)
Eosinophils Absolute: 0 10*3/uL (ref 0.0–0.5)
HEMATOCRIT: 23.6 % — AB (ref 34.8–46.6)
HGB: 7.2 g/dL — ABNORMAL LOW (ref 11.6–15.9)
LYMPH%: 42.6 % (ref 14.0–49.7)
MCH: 34.6 pg — ABNORMAL HIGH (ref 25.1–34.0)
MCHC: 30.5 g/dL — ABNORMAL LOW (ref 31.5–36.0)
MCV: 113.5 fL — ABNORMAL HIGH (ref 79.5–101.0)
MONO#: 0.2 10*3/uL (ref 0.1–0.9)
MONO%: 3.6 % (ref 0.0–14.0)
NEUT%: 53.1 % (ref 38.4–76.8)
NEUTROS ABS: 3.6 10*3/uL (ref 1.5–6.5)
NRBC: 2 % — AB (ref 0–0)
PLATELETS: 190 10*3/uL (ref 145–400)
RBC: 2.08 10*6/uL — AB (ref 3.70–5.45)
RDW: 27.3 % — ABNORMAL HIGH (ref 11.2–14.5)
WBC: 6.7 10*3/uL (ref 3.9–10.3)
lymph#: 2.9 10*3/uL (ref 0.9–3.3)

## 2015-01-20 LAB — TECHNOLOGIST REVIEW

## 2015-01-20 LAB — HOLD TUBE, BLOOD BANK

## 2015-01-20 NOTE — Telephone Encounter (Signed)
Returned pt's call - no answer; message left to call me back.

## 2015-01-20 NOTE — Telephone Encounter (Signed)
Call pt - pt states Hgb 7.2 and she's feeling tired/weak. Talked to Dr Beryle Beams - he's awared of Hgb and no blood transfusion at this time. Call pt again and inform no blood transfusion.

## 2015-01-26 ENCOUNTER — Telehealth: Payer: Self-pay | Admitting: *Deleted

## 2015-01-26 NOTE — Telephone Encounter (Signed)
Returned pt's call - informed pt I had already talked to the pharm at [facility]; informed them Jadenu to be taken daily per Dr Beryle Beams also has refills. Pt states she has been taking Jadenu every other day. Also inquired about [employer]. Talked to Josephina Shih - stated letter was placed in our out box mail on Friday(7/8) which was probably picked up on Monday.

## 2015-01-27 NOTE — Telephone Encounter (Signed)
Pt left message this morning that she has received the juty duty letter.

## 2015-01-31 ENCOUNTER — Telehealth: Payer: Self-pay | Admitting: *Deleted

## 2015-01-31 ENCOUNTER — Telehealth: Payer: Self-pay | Admitting: Oncology

## 2015-01-31 ENCOUNTER — Other Ambulatory Visit: Payer: Medicare Other

## 2015-01-31 ENCOUNTER — Other Ambulatory Visit: Payer: Self-pay | Admitting: Oncology

## 2015-01-31 DIAGNOSIS — D469 Myelodysplastic syndrome, unspecified: Secondary | ICD-10-CM

## 2015-01-31 NOTE — Telephone Encounter (Signed)
Appt scheduled tomorrow at 1000AM ; pt called/informed,.

## 2015-01-31 NOTE — Telephone Encounter (Signed)
Dr. Beryle Beams office cld to make lab appt for pt they stated they will call pt to give time

## 2015-01-31 NOTE — Telephone Encounter (Signed)
Message from pt - "not doing very well", "doing very little".  Talked to pt - states she breathes better outside than inside her home; air condition person is coming to check the system this morning,. And if not feeling better, she will let us know. Next lab appt is 7/28.

## 2015-01-31 NOTE — Telephone Encounter (Signed)
Have her go to cancer center for lab tomorrow in case she needs a transfusion.

## 2015-01-31 NOTE — Telephone Encounter (Signed)
Confirmed lab appointment for 07/19

## 2015-01-31 NOTE — Telephone Encounter (Signed)
Call from pt - states no problems w/air condition; she would like CBC done tomorrow since she's not feeling well.

## 2015-02-01 ENCOUNTER — Ambulatory Visit (HOSPITAL_BASED_OUTPATIENT_CLINIC_OR_DEPARTMENT_OTHER): Payer: Medicare Other

## 2015-02-01 ENCOUNTER — Other Ambulatory Visit (HOSPITAL_BASED_OUTPATIENT_CLINIC_OR_DEPARTMENT_OTHER): Payer: Medicare Other

## 2015-02-01 ENCOUNTER — Telehealth: Payer: Self-pay | Admitting: *Deleted

## 2015-02-01 ENCOUNTER — Ambulatory Visit (HOSPITAL_COMMUNITY)
Admission: RE | Admit: 2015-02-01 | Discharge: 2015-02-01 | Disposition: A | Discharge status: Home / Self Care | Payer: Medicare Other | Source: Ambulatory Visit | Attending: Oncology | Admitting: Oncology

## 2015-02-01 ENCOUNTER — Other Ambulatory Visit: Payer: Self-pay | Admitting: Oncology

## 2015-02-01 VITALS — BP 135/58 | HR 86 | Temp 98.1°F | Resp 18

## 2015-02-01 DIAGNOSIS — D469 Myelodysplastic syndrome, unspecified: Secondary | ICD-10-CM

## 2015-02-01 DIAGNOSIS — D649 Anemia, unspecified: Secondary | ICD-10-CM | POA: Diagnosis not present

## 2015-02-01 DIAGNOSIS — R162 Hepatomegaly with splenomegaly, not elsewhere classified: Secondary | ICD-10-CM | POA: Diagnosis not present

## 2015-02-01 DIAGNOSIS — D479 Neoplasm of uncertain behavior of lymphoid, hematopoietic and related tissue, unspecified: Secondary | ICD-10-CM | POA: Diagnosis not present

## 2015-02-01 DIAGNOSIS — T7840XA Allergy, unspecified, initial encounter: Secondary | ICD-10-CM

## 2015-02-01 LAB — CBC WITH DIFFERENTIAL/PLATELET
BASO%: 0.2 % (ref 0.0–2.0)
BASOS ABS: 0 10*3/uL (ref 0.0–0.1)
EOS ABS: 0 10*3/uL (ref 0.0–0.5)
EOS%: 0.7 % (ref 0.0–7.0)
HCT: 22.9 % — ABNORMAL LOW (ref 34.8–46.6)
HEMOGLOBIN: 6.9 g/dL — AB (ref 11.6–15.9)
LYMPH%: 41.8 % (ref 14.0–49.7)
MCH: 35.8 pg — ABNORMAL HIGH (ref 25.1–34.0)
MCHC: 30.1 g/dL — AB (ref 31.5–36.0)
MCV: 118.7 fL — ABNORMAL HIGH (ref 79.5–101.0)
MONO#: 0.2 10*3/uL (ref 0.1–0.9)
MONO%: 3.4 % (ref 0.0–14.0)
NEUT%: 53.9 % (ref 38.4–76.8)
NEUTROS ABS: 3.1 10*3/uL (ref 1.5–6.5)
Platelets: 161 10*3/uL (ref 145–400)
RBC: 1.93 10*6/uL — ABNORMAL LOW (ref 3.70–5.45)
RDW: 24.5 % — AB (ref 11.2–14.5)
WBC: 5.7 10*3/uL (ref 3.9–10.3)
lymph#: 2.4 10*3/uL (ref 0.9–3.3)
nRBC: 2 % — ABNORMAL HIGH (ref 0–0)

## 2015-02-01 LAB — PREPARE RBC (CROSSMATCH)

## 2015-02-01 LAB — HOLD TUBE, BLOOD BANK

## 2015-02-01 MED ORDER — DIPHENHYDRAMINE HCL 50 MG/ML IJ SOLN: 50.0000 mg | INTRAMUSCULAR | Status: DC

## 2015-02-01 MED ORDER — SODIUM CHLORIDE 0.9 % IJ SOLN
3.0000 mL | INTRAMUSCULAR | Status: AC | PRN
  Administered 2015-02-01: 10 mL
  Filled 2015-02-01: qty 10

## 2015-02-01 MED ORDER — HEPARIN SOD (PORK) LOCK FLUSH 100 UNIT/ML IV SOLN
500.0000 [IU] | Freq: Every day | INTRAVENOUS | Status: AC | PRN
  Administered 2015-02-01: 500 [IU]
  Filled 2015-02-01: qty 5

## 2015-02-01 MED ORDER — LORAZEPAM 2 MG/ML IJ SOLN
0.5000 mg | Freq: Once | INTRAMUSCULAR | Status: AC
  Administered 2015-02-01: 0.5 mg via INTRAVENOUS

## 2015-02-01 MED ORDER — FAMOTIDINE IN NACL 20-0.9 MG/50ML-% IV SOLN: 20.0000 mg | INTRAVENOUS | Status: DC

## 2015-02-01 MED ORDER — METHYLPREDNISOLONE SODIUM SUCC 125 MG IJ SOLR
INTRAMUSCULAR | Status: AC
  Filled 2015-02-01: qty 2

## 2015-02-01 MED ORDER — DIPHENHYDRAMINE HCL 50 MG/ML IJ SOLN
12.5000 mg | Freq: Once | INTRAMUSCULAR | Status: AC
  Administered 2015-02-01: 12.5 mg via INTRAVENOUS

## 2015-02-01 MED ORDER — METHYLPREDNISOLONE SODIUM SUCC 125 MG IJ SOLR
125.0000 mg | INTRAMUSCULAR | Status: AC
  Administered 2015-02-01: 125 mg via INTRAVENOUS

## 2015-02-01 MED ORDER — DIPHENHYDRAMINE HCL 50 MG/ML IJ SOLN: 50.0000 mg | Freq: Once | INTRAMUSCULAR | Status: DC

## 2015-02-01 MED ORDER — LORAZEPAM 2 MG/ML IJ SOLN
INTRAMUSCULAR | Status: AC
  Filled 2015-02-01: qty 1

## 2015-02-01 NOTE — Telephone Encounter (Signed)
Pt worked in for transfusion today, 2 units- per Dr. Beryle Beams.

## 2015-02-01 NOTE — Progress Notes (Signed)
Pt received 1 unit of blood without complication. Started second unit at 1510. Completed 15 minute follow up- 1525. Vitals stable- pt denied any SOB, Nausea or pain. 49 Pt wanted to go outside on balcony and see the garden below. Upon re-entering building pt became dizzy and increased shortness of breath. Transfusion was stopped. Pt complained of dyspnea, impending doom, feeling "flighty in her eyes" and a headache. Selena Lesser, FNP came to evaluate pt. Dr. Waymon Budge was called and orders were placed for Benadryl 12.5mg , Solumedrol 60mg . Pt vital signs remained stable.   1600 Pt assisted to the restroom and had a bowel movement. Pt reported she was feeling much better.  1610 Pt resting in chair reports SOB. Pt is very concerned about her transportation home. Assured pt she would be able to get her car from [place] and she will call her niece to pick her up.

## 2015-02-01 NOTE — Patient Instructions (Signed)

## 2015-02-01 NOTE — Progress Notes (Signed)
1710: Pt reports feeling back to baseline except she still feels "spacy." Conversing and ambulating to restroom without difficulty. Vital signs remained stable during observation. Pt declines to restart transfusion despite being informed Dr. Beryle Beams thinks it is not likely a true blood reaction. She does not feel comfortable resuming transfusion after becoming nauseated and short of breath. Pt states she will call if she feels like she needs a transfusion later in the week.  Blood discontinued, port de-accessed. Pt discharged to home with friend who will drive. Retta Mac, NP notified. Will make Dr. Beryle Beams aware.

## 2015-02-02 LAB — TYPE AND SCREEN
ABO/RH(D): A POS
Antibody Screen: NEGATIVE
UNIT DIVISION: 0
Unit division: 0

## 2015-02-07 ENCOUNTER — Telehealth: Payer: Self-pay | Admitting: *Deleted

## 2015-02-07 NOTE — Telephone Encounter (Signed)
Pt requesting medications she is not taken to be taken off her med list - Cipro, Jello nasal spray, Oxymetazoline, Ibuprofen, Fluoxetine, and Fluticasone.

## 2015-02-07 NOTE — Telephone Encounter (Signed)
Vitamin D 400 units entered in error; has been d/c's.

## 2015-02-10 ENCOUNTER — Other Ambulatory Visit (HOSPITAL_BASED_OUTPATIENT_CLINIC_OR_DEPARTMENT_OTHER): Payer: Medicare Other

## 2015-02-10 DIAGNOSIS — D469 Myelodysplastic syndrome, unspecified: Secondary | ICD-10-CM

## 2015-02-10 DIAGNOSIS — D471 Chronic myeloproliferative disease: Secondary | ICD-10-CM | POA: Diagnosis present

## 2015-02-10 DIAGNOSIS — D6489 Other specified anemias: Secondary | ICD-10-CM

## 2015-02-10 LAB — CBC WITH DIFFERENTIAL/PLATELET
BASO%: 0.2 % (ref 0.0–2.0)
BASOS ABS: 0 10*3/uL (ref 0.0–0.1)
EOS ABS: 0 10*3/uL (ref 0.0–0.5)
EOS%: 0.5 % (ref 0.0–7.0)
HEMATOCRIT: 24.8 % — AB (ref 34.8–46.6)
HEMOGLOBIN: 7.8 g/dL — AB (ref 11.6–15.9)
LYMPH%: 44.3 % (ref 14.0–49.7)
MCH: 33.9 pg (ref 25.1–34.0)
MCHC: 31.5 g/dL (ref 31.5–36.0)
MCV: 107.8 fL — AB (ref 79.5–101.0)
MONO#: 0.3 10*3/uL (ref 0.1–0.9)
MONO%: 4.6 % (ref 0.0–14.0)
NEUT#: 3.2 10*3/uL (ref 1.5–6.5)
NEUT%: 50.4 % (ref 38.4–76.8)
Platelets: 195 10*3/uL (ref 145–400)
RBC: 2.3 10*6/uL — ABNORMAL LOW (ref 3.70–5.45)
RDW: 26.1 % — AB (ref 11.2–14.5)
WBC: 6.3 10*3/uL (ref 3.9–10.3)
lymph#: 2.8 10*3/uL (ref 0.9–3.3)
nRBC: 1 % — ABNORMAL HIGH (ref 0–0)

## 2015-02-10 LAB — HOLD TUBE, BLOOD BANK

## 2015-02-10 LAB — TECHNOLOGIST REVIEW

## 2015-02-28 ENCOUNTER — Other Ambulatory Visit: Payer: Self-pay | Admitting: Oncology

## 2015-03-03 ENCOUNTER — Ambulatory Visit (HOSPITAL_COMMUNITY)
Admission: RE | Admit: 2015-03-03 | Discharge: 2015-03-03 | Disposition: A | Discharge status: Home / Self Care | Payer: Medicare Other | Source: Ambulatory Visit | Attending: Oncology | Admitting: Oncology

## 2015-03-03 ENCOUNTER — Other Ambulatory Visit: Payer: Self-pay | Admitting: Oncology

## 2015-03-03 ENCOUNTER — Other Ambulatory Visit (HOSPITAL_BASED_OUTPATIENT_CLINIC_OR_DEPARTMENT_OTHER): Payer: Medicare Other

## 2015-03-03 DIAGNOSIS — D469 Myelodysplastic syndrome, unspecified: Secondary | ICD-10-CM

## 2015-03-03 DIAGNOSIS — D471 Chronic myeloproliferative disease: Secondary | ICD-10-CM

## 2015-03-03 DIAGNOSIS — D479 Neoplasm of uncertain behavior of lymphoid, hematopoietic and related tissue, unspecified: Secondary | ICD-10-CM | POA: Diagnosis not present

## 2015-03-03 DIAGNOSIS — D6489 Other specified anemias: Secondary | ICD-10-CM

## 2015-03-03 LAB — CBC WITH DIFFERENTIAL/PLATELET
BASO%: 1.7 % (ref 0.0–2.0)
Basophils Absolute: 0.1 10*3/uL (ref 0.0–0.1)
EOS ABS: 0 10*3/uL (ref 0.0–0.5)
EOS%: 0.6 % (ref 0.0–7.0)
HCT: 21.8 % — ABNORMAL LOW (ref 34.8–46.6)
HGB: 6.9 g/dL — CL (ref 11.6–15.9)
LYMPH%: 44.9 % (ref 14.0–49.7)
MCH: 36 pg — ABNORMAL HIGH (ref 25.1–34.0)
MCHC: 31.5 g/dL (ref 31.5–36.0)
MCV: 114.6 fL — ABNORMAL HIGH (ref 79.5–101.0)
MONO#: 0.1 10*3/uL (ref 0.1–0.9)
MONO%: 2.6 % (ref 0.0–14.0)
NEUT%: 50.2 % (ref 38.4–76.8)
NEUTROS ABS: 2.7 10*3/uL (ref 1.5–6.5)
Platelets: 145 10*3/uL (ref 145–400)
RBC: 1.91 10*6/uL — AB (ref 3.70–5.45)
RDW: 26.4 % — AB (ref 11.2–14.5)
WBC: 5.3 10*3/uL (ref 3.9–10.3)
lymph#: 2.4 10*3/uL (ref 0.9–3.3)

## 2015-03-03 LAB — HOLD TUBE, BLOOD BANK

## 2015-03-03 LAB — TECHNOLOGIST REVIEW

## 2015-03-03 LAB — PREPARE RBC (CROSSMATCH)

## 2015-03-04 ENCOUNTER — Ambulatory Visit (HOSPITAL_BASED_OUTPATIENT_CLINIC_OR_DEPARTMENT_OTHER): Payer: Medicare Other

## 2015-03-04 VITALS — BP 136/53 | HR 77 | Temp 98.1°F | Resp 18

## 2015-03-04 DIAGNOSIS — D479 Neoplasm of uncertain behavior of lymphoid, hematopoietic and related tissue, unspecified: Secondary | ICD-10-CM | POA: Diagnosis not present

## 2015-03-04 DIAGNOSIS — D469 Myelodysplastic syndrome, unspecified: Secondary | ICD-10-CM

## 2015-03-04 DIAGNOSIS — D471 Chronic myeloproliferative disease: Secondary | ICD-10-CM

## 2015-03-04 MED ORDER — SODIUM CHLORIDE 0.9 % IV SOLN
250.0000 mL | Freq: Once | INTRAVENOUS | Status: AC
  Administered 2015-03-04: 250 mL via INTRAVENOUS

## 2015-03-04 MED ORDER — HEPARIN SOD (PORK) LOCK FLUSH 100 UNIT/ML IV SOLN
500.0000 [IU] | Freq: Every day | INTRAVENOUS | Status: DC | PRN
  Filled 2015-03-04: qty 5

## 2015-03-04 MED ORDER — LORAZEPAM 2 MG/ML IJ SOLN
0.5000 mg | Freq: Once | INTRAMUSCULAR | Status: AC
  Administered 2015-03-04: 0.5 mg via INTRAVENOUS

## 2015-03-04 MED ORDER — LORAZEPAM 2 MG/ML IJ SOLN
INTRAMUSCULAR | Status: AC
  Filled 2015-03-04: qty 1

## 2015-03-04 MED ORDER — SODIUM CHLORIDE 0.9 % IJ SOLN
10.0000 mL | INTRAMUSCULAR | Status: DC | PRN
  Filled 2015-03-04: qty 10

## 2015-03-04 NOTE — Patient Instructions (Signed)

## 2015-03-06 LAB — TYPE AND SCREEN
ABO/RH(D): A POS
Antibody Screen: NEGATIVE
Unit division: 0
Unit division: 0

## 2015-03-07 NOTE — Addendum Note (Signed)
Addended by: Adalberto Cole on: 03/07/2015 03:50 PM   Modules accepted: Orders

## 2015-03-08 ENCOUNTER — Telehealth: Payer: Self-pay | Admitting: *Deleted

## 2015-03-08 NOTE — Telephone Encounter (Signed)
Received faxed  PA request from [facility] for the Jadenu 360mg  tabs take 1080 mg daily# 90.  Pt has chronic iron overload that is a result of blood transfusions (E83.111). Request sent for review.Despina Hidden Cassady8/23/20162:00 PM     Phone# [phone] Fax# [phone] ID# [id number] Ref# [id number]

## 2015-03-10 NOTE — Telephone Encounter (Signed)
Received faxed form that medication has been approved through 03/07/2016.Carolyn Hidden Cassady8/25/20164:27 PM

## 2015-03-24 ENCOUNTER — Other Ambulatory Visit (HOSPITAL_BASED_OUTPATIENT_CLINIC_OR_DEPARTMENT_OTHER): Payer: Medicare Other

## 2015-03-24 DIAGNOSIS — D6489 Other specified anemias: Secondary | ICD-10-CM

## 2015-03-24 DIAGNOSIS — D469 Myelodysplastic syndrome, unspecified: Secondary | ICD-10-CM

## 2015-03-24 DIAGNOSIS — D471 Chronic myeloproliferative disease: Secondary | ICD-10-CM

## 2015-03-24 LAB — CBC WITH DIFFERENTIAL/PLATELET
BASO%: 1.8 % (ref 0.0–2.0)
BASOS ABS: 0.1 10*3/uL (ref 0.0–0.1)
EOS ABS: 0.1 10*3/uL (ref 0.0–0.5)
EOS%: 0.9 % (ref 0.0–7.0)
HEMATOCRIT: 25.9 % — AB (ref 34.8–46.6)
HEMOGLOBIN: 8.3 g/dL — AB (ref 11.6–15.9)
LYMPH#: 3.1 10*3/uL (ref 0.9–3.3)
LYMPH%: 46.7 % (ref 14.0–49.7)
MCH: 32.9 pg (ref 25.1–34.0)
MCHC: 31.9 g/dL (ref 31.5–36.0)
MCV: 103.1 fL — AB (ref 79.5–101.0)
MONO#: 0 10*3/uL — ABNORMAL LOW (ref 0.1–0.9)
MONO%: 0.5 % (ref 0.0–14.0)
NEUT#: 3.4 10*3/uL (ref 1.5–6.5)
NEUT%: 50.1 % (ref 38.4–76.8)
Platelets: 162 10*3/uL (ref 145–400)
RBC: 2.51 10*6/uL — ABNORMAL LOW (ref 3.70–5.45)
RDW: 30.3 % — AB (ref 11.2–14.5)
WBC: 6.7 10*3/uL (ref 3.9–10.3)

## 2015-03-24 LAB — HOLD TUBE, BLOOD BANK

## 2015-04-04 ENCOUNTER — Encounter: Payer: Self-pay | Admitting: Oncology

## 2015-04-04 ENCOUNTER — Ambulatory Visit (INDEPENDENT_AMBULATORY_CARE_PROVIDER_SITE_OTHER): Payer: Medicare Other | Admitting: Oncology

## 2015-04-04 ENCOUNTER — Other Ambulatory Visit: Payer: Self-pay | Admitting: Oncology

## 2015-04-04 VITALS — BP 146/84 | HR 76 | Temp 97.8°F | Ht 62.0 in | Wt 173.0 lb

## 2015-04-04 DIAGNOSIS — Z8619 Personal history of other infectious and parasitic diseases: Secondary | ICD-10-CM

## 2015-04-04 DIAGNOSIS — D500 Iron deficiency anemia secondary to blood loss (chronic): Secondary | ICD-10-CM

## 2015-04-04 DIAGNOSIS — R162 Hepatomegaly with splenomegaly, not elsewhere classified: Secondary | ICD-10-CM

## 2015-04-04 DIAGNOSIS — D469 Myelodysplastic syndrome, unspecified: Secondary | ICD-10-CM

## 2015-04-04 DIAGNOSIS — F418 Other specified anxiety disorders: Secondary | ICD-10-CM | POA: Diagnosis not present

## 2015-04-04 DIAGNOSIS — D471 Chronic myeloproliferative disease: Secondary | ICD-10-CM | POA: Diagnosis present

## 2015-04-04 NOTE — Patient Instructions (Signed)
Continue lab every 3 weeks at cancer center We will repeat ferritin when you get lab on 9/29 Return visit 4 months

## 2015-04-04 NOTE — Progress Notes (Signed)
Patient ID: Carolyn Brennan, female   DOB: May 30, 1941, 74 y.o.   MRN: [id number] Hematology and Oncology Follow Up Visit  Carolyn Brennan [id number] 1940-12-30 74 y.o. 04/04/2015 11:27 AM   Principle Diagnosis: Encounter Diagnoses  Name Primary?  [name] MDS/MPN (myelodysplastic/myeloproliferative neoplasms) Yes  . Hepatosplenomegaly on CT abdomen 11/17/12    clinical summary: 73 year old woman with a myeloproliferative disorder with dysplastic features. She has become transfusion-dependent for red blood cells. She is requiring blood every 3-4 weeks. She failed a trial of Aranesp and Procrit. Blood counts as far back as July 2007 showed a macrocytic anemia with hemoglobin 11 g, leukocytosis with white count 13,600, and mild elevation of her platelet count of 399,000. She was referred to our office in April 2008. Initial bone marrow done by my partner Dr. Marin Olp showed a hypercellular marrow with ring sideroblasts. Routine cytogenetics were normal. FISH studies not done on that marrow. She tested negative for the JAK-2 mutation. Most recent bone marrow done 01/25/2011 showed normal cytogenetics. No evidence for the 5Q deletion on FISH. This marrow changed little compared with the initial study done at diagnosis in April of 2008. Marrow hypercellular for age, 90%, with prominent erythroid proliferation, megakaryocyte proliferation with the large and abnormal forms. No excess blasts. Normal maturation in the myeloid series. dysplastic changes in the erythroid series.  She  developed iron overload from repetitive transfusions.  Ferritin levels were climbing. After much discussion and deliberation, she agreed to a trial of Exjade started in September 2014. Peak ferritin almost 5000 in May 2014 but this correlated with an acute infectious process-staphylococcal aureus bacteremia. A more reliable baseline obtained 05/13/2013 was 2236 units. Overall she is tolerating the drug well. She was recently changed to the  more GI user friendly reformulation of the drug brand-name Jadenu. Ferritin levels have improved significantly with most recent value down significantly to 343 on 12/27/14 . She had an eye exam  in March of 2015. Renal function has been stable.  Interim History:   She has elected to stop all of her medications. She went off her antidepressive. She had a rebound episode of depression and discussed going back on an alternative medication with her primary care physician but then she decided that she was not depressed anymore and did not need to be medicated. In view of the significant improvement in her ferritin, she asked if she could go off the [place]. She went on a tapering schedule to every other day and then stop the drug completely on 04/02/2015. I will repeat iron studies again next week. She complains of early satiety but states that she is determined to continue to lose weight. She went on a progress report on her blood disorder. I told her I thought she was doing better than expected. We have not seen any deterioration in her white count or her platelet count. She has an isolated anemia. Since I restricted transfusions she is actually done better. Getting her ferritin down will avoid other problems such as cardiac failure or arrhythmias.  Medications: reviewed  Allergies: No Known Allergies  Review of Systems: See history of present illness Remaining ROS negative:   Physical Exam: Blood pressure 146/84, pulse 76, temperature 97.8 F (36.6 C), temperature source Oral, height 5\' 2"  (1.575 m), weight 173 lb (78.472 kg), SpO2 97 %. Wt Readings from Last 3 Encounters:  04/04/15 173 lb (78.472 kg)  12/27/14 175 lb 4.8 oz (79.516 kg)  08/23/14 172 lb 4.8 oz (78.155 kg)  General appearance: Well-nourished Caucasian woman HENNT: Pharynx no erythema, exudate, mass, or ulcer. No thyromegaly or thyroid nodules Lymph nodes: No cervical, supraclavicular, or axillary lymphadenopathy Breasts:   Lungs: Clear to auscultation, resonant to percussion throughout Heart: Regular rhythm, no murmur, no gallop, no rub, no click, no edema Abdomen: Soft, nontender, normal bowel sounds, no mass, no organomegaly , splenomegaly by scan Extremities: No edema, no calf tenderness Musculoskeletal: no joint deformities GU:  Vascular: Carotid pulses 2+, no bruits, Neurologic: Alert, oriented, PERRLA, I was unable to visualize the optic disks, vessels normal, no hemorrhage or exudate, cranial nerves grossly normal, motor strength 5 over 5, reflexes 1+ symmetric, upper body coordination normal, gait normal, Skin: No rash or ecchymosis  Lab Results: CBC W/Diff    Component Value Date/Time   WBC 6.7 03/24/2015 1142   WBC 6.6 08/23/2014 1153   WBC 16.6* 04/16/2012 1858   RBC 2.51* 03/24/2015 1142   RBC 2.68* 08/23/2014 1153   RBC 2.80* 04/16/2012 1858   HGB 8.3* 03/24/2015 1142   HGB 9.1* 08/23/2014 1153   HGB 8.4* 04/16/2012 1858   HCT 25.9* 03/24/2015 1142   HCT 28.0* 08/23/2014 1153   HCT 27.2* 04/16/2012 1858   PLT 162 03/24/2015 1142   PLT 226 08/23/2014 1153   MCV 103.1* 03/24/2015 1142   MCV 104.5* 08/23/2014 1153   MCV 97.1* 04/16/2012 1858   MCH 32.9 03/24/2015 1142   MCH 34.0 08/23/2014 1153   MCH 30.0 04/16/2012 1858   MCHC 31.9 03/24/2015 1142   MCHC 32.5 08/23/2014 1153   MCHC 30.9* 04/16/2012 1858   RDW 30.3* 03/24/2015 1142   RDW 20.1* 08/23/2014 1153   LYMPHSABS 3.1 03/24/2015 1142   LYMPHSABS 2.8 08/23/2014 1153   MONOABS 0.0* 03/24/2015 1142   MONOABS 0.2 08/23/2014 1153   EOSABS 0.1 03/24/2015 1142   EOSABS 0.1 08/23/2014 1153   BASOSABS 0.1 03/24/2015 1142   BASOSABS 0.0 08/23/2014 1153     Chemistry      Component Value Date/Time   NA 139 12/27/2014 1112   NA 140 08/23/2014 1153   K 4.1 12/27/2014 1112   K 4.4 08/23/2014 1153   CL 107 08/23/2014 1153   CL 103 09/01/2012 1453   CO2 21* 12/27/2014 1112   CO2 24 08/23/2014 1153   BUN 18.3 12/27/2014 1112    BUN 23 08/23/2014 1153   CREATININE 0.8 12/27/2014 1112   CREATININE 0.81 08/23/2014 1153   CREATININE 1.30* 07/09/2014 1633      Component Value Date/Time   CALCIUM 8.8 12/27/2014 1112   CALCIUM 9.2 08/23/2014 1153   ALKPHOS 63 12/27/2014 1112   ALKPHOS 51 08/23/2014 1153   AST 16 12/27/2014 1112   AST 15 08/23/2014 1153   ALT 10 12/27/2014 1112   ALT 10 08/23/2014 1153   BILITOT 3.21* 12/27/2014 1112   BILITOT 3.6* 08/23/2014 1153       Radiological Studies: No results found.  Impression:  #1. 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  Continue to monitor CBCs on an every three-week basis. Transfuse for hemoglobin 7 g or below.  #2. 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   See discussion above  #3. 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  She has recently discontinued this medication. I will continue to monitor her iron. Resume when ferritin starts to increase       which is inevitable since she continues to require periodic transfusions.  #4. Idiopathic  Staphylococcus aureus sepsis. May 2014   #5. Status post Mohs surgery to excise a basal cell carcinoma bridge of nose   #6. Hepatosplenomegaly secondary to her underlying myeloproliferative disorder stable by exam  #7. Chronic anxiety and depression  #8.  Chronic, stable, elevation of bilirubin. 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  CC: Patient Care Team: Darcus Austin, MD as PCP - General (Family Medicine) Annia Belt, MD as Consulting Physician (Hematology and Oncology)   Annia Belt, MD 9/19/201611:27 AM

## 2015-04-14 ENCOUNTER — Other Ambulatory Visit (HOSPITAL_BASED_OUTPATIENT_CLINIC_OR_DEPARTMENT_OTHER): Payer: Medicare Other

## 2015-04-14 DIAGNOSIS — D6489 Other specified anemias: Secondary | ICD-10-CM

## 2015-04-14 DIAGNOSIS — D500 Iron deficiency anemia secondary to blood loss (chronic): Secondary | ICD-10-CM

## 2015-04-14 DIAGNOSIS — D469 Myelodysplastic syndrome, unspecified: Secondary | ICD-10-CM

## 2015-04-14 DIAGNOSIS — D471 Chronic myeloproliferative disease: Secondary | ICD-10-CM

## 2015-04-14 LAB — CBC WITH DIFFERENTIAL/PLATELET
BASO%: 0.1 % (ref 0.0–2.0)
BASOS ABS: 0 10*3/uL (ref 0.0–0.1)
EOS ABS: 0 10*3/uL (ref 0.0–0.5)
EOS%: 0.4 % (ref 0.0–7.0)
HEMATOCRIT: 25.7 % — AB (ref 34.8–46.6)
HEMOGLOBIN: 7.9 g/dL — AB (ref 11.6–15.9)
LYMPH#: 2.8 10*3/uL (ref 0.9–3.3)
LYMPH%: 41.6 % (ref 14.0–49.7)
MCH: 33.1 pg (ref 25.1–34.0)
MCHC: 30.7 g/dL — ABNORMAL LOW (ref 31.5–36.0)
MCV: 107.5 fL — ABNORMAL HIGH (ref 79.5–101.0)
MONO#: 0.2 10*3/uL (ref 0.1–0.9)
MONO%: 3.6 % (ref 0.0–14.0)
NEUT#: 3.7 10*3/uL (ref 1.5–6.5)
NEUT%: 54.3 % (ref 38.4–76.8)
PLATELETS: 156 10*3/uL (ref 145–400)
RBC: 2.39 10*6/uL — ABNORMAL LOW (ref 3.70–5.45)
WBC: 6.7 10*3/uL (ref 3.9–10.3)

## 2015-04-14 LAB — COMPREHENSIVE METABOLIC PANEL (CC13)
ALBUMIN: 4.6 g/dL (ref 3.5–5.0)
ALK PHOS: 62 U/L (ref 40–150)
ALT: 11 U/L (ref 0–55)
ANION GAP: 8 meq/L (ref 3–11)
AST: 15 U/L (ref 5–34)
BUN: 16.5 mg/dL (ref 7.0–26.0)
CALCIUM: 9.5 mg/dL (ref 8.4–10.4)
CO2: 23 mEq/L (ref 22–29)
CREATININE: 0.8 mg/dL (ref 0.6–1.1)
Chloride: 108 mEq/L (ref 98–109)
EGFR: 72 mL/min/{1.73_m2} — ABNORMAL LOW (ref >90)
Glucose: 104 mg/dl (ref 70–140)
POTASSIUM: 4.3 meq/L (ref 3.5–5.1)
Sodium: 139 mEq/L (ref 136–145)
Total Bilirubin: 3.87 mg/dL (ref 0.20–1.20)
Total Protein: 6.7 g/dL (ref 6.4–8.3)

## 2015-04-14 LAB — TECHNOLOGIST REVIEW

## 2015-04-14 LAB — LACTATE DEHYDROGENASE (CC13): LDH: 245 U/L (ref 125–245)

## 2015-04-14 LAB — HOLD TUBE, BLOOD BANK

## 2015-04-14 LAB — FERRITIN CHCC: FERRITIN: 491 ng/mL — AB (ref 9–269)

## 2015-05-05 ENCOUNTER — Other Ambulatory Visit (HOSPITAL_BASED_OUTPATIENT_CLINIC_OR_DEPARTMENT_OTHER): Payer: Medicare Other

## 2015-05-05 DIAGNOSIS — D479 Neoplasm of uncertain behavior of lymphoid, hematopoietic and related tissue, unspecified: Secondary | ICD-10-CM

## 2015-05-05 DIAGNOSIS — D6489 Other specified anemias: Secondary | ICD-10-CM

## 2015-05-05 DIAGNOSIS — D469 Myelodysplastic syndrome, unspecified: Secondary | ICD-10-CM

## 2015-05-05 LAB — CBC WITH DIFFERENTIAL/PLATELET
BASO%: 0.4 % (ref 0.0–2.0)
BASOS ABS: 0 10*3/uL (ref 0.0–0.1)
EOS%: 0.3 % (ref 0.0–7.0)
Eosinophils Absolute: 0 10*3/uL (ref 0.0–0.5)
HCT: 22.4 % — ABNORMAL LOW (ref 34.8–46.6)
HGB: 6.9 g/dL — CL (ref 11.6–15.9)
LYMPH%: 33.7 % (ref 14.0–49.7)
MCH: 34.3 pg — AB (ref 25.1–34.0)
MCHC: 30.8 g/dL — AB (ref 31.5–36.0)
MCV: 111.4 fL — AB (ref 79.5–101.0)
MONO#: 0.3 10*3/uL (ref 0.1–0.9)
MONO%: 4.2 % (ref 0.0–14.0)
NEUT#: 4.7 10*3/uL (ref 1.5–6.5)
NEUT%: 61.4 % (ref 38.4–76.8)
NRBC: 2 % — AB (ref 0–0)
Platelets: 145 10*3/uL (ref 145–400)
RBC: 2.01 10*6/uL — AB (ref 3.70–5.45)
RDW: 28.3 % — AB (ref 11.2–14.5)
WBC: 7.7 10*3/uL (ref 3.9–10.3)
lymph#: 2.6 10*3/uL (ref 0.9–3.3)

## 2015-05-05 LAB — TECHNOLOGIST REVIEW

## 2015-05-05 LAB — HOLD TUBE, BLOOD BANK

## 2015-05-18 ENCOUNTER — Ambulatory Visit (INDEPENDENT_AMBULATORY_CARE_PROVIDER_SITE_OTHER): Payer: Medicare Other | Admitting: Neurology

## 2015-05-18 ENCOUNTER — Other Ambulatory Visit (INDEPENDENT_AMBULATORY_CARE_PROVIDER_SITE_OTHER): Payer: Medicare Other

## 2015-05-18 ENCOUNTER — Encounter: Payer: Self-pay | Admitting: Neurology

## 2015-05-18 VITALS — BP 132/72 | HR 89 | Resp 18 | Wt 171.0 lb

## 2015-05-18 DIAGNOSIS — F329 Major depressive disorder, single episode, unspecified: Secondary | ICD-10-CM

## 2015-05-18 DIAGNOSIS — G250 Essential tremor: Secondary | ICD-10-CM

## 2015-05-18 DIAGNOSIS — M6289 Other specified disorders of muscle: Secondary | ICD-10-CM

## 2015-05-18 DIAGNOSIS — Z79899 Other long term (current) drug therapy: Secondary | ICD-10-CM | POA: Diagnosis not present

## 2015-05-18 DIAGNOSIS — F32A Depression, unspecified: Secondary | ICD-10-CM

## 2015-05-18 DIAGNOSIS — M4802 Spinal stenosis, cervical region: Secondary | ICD-10-CM

## 2015-05-18 DIAGNOSIS — R29898 Other symptoms and signs involving the musculoskeletal system: Secondary | ICD-10-CM

## 2015-05-18 LAB — TSH: TSH: 1.45 u[IU]/mL (ref 0.35–4.50)

## 2015-05-18 LAB — VITAMIN B12: Vitamin B-12: 193 pg/mL — ABNORMAL LOW (ref 211–911)

## 2015-05-18 NOTE — Progress Notes (Signed)
[facility] Clinic Note - Initial Visit   Date: 05/18/2015  [name] MRN: [id number] DOB: 1941/02/28   Dear Dr. Inda Merlin:  Thank you for your kind referral of Carolyn Brennan for consultation of right hand weakness. Although her history is well known to you, please allow [place] to reiterate it for the purpose of our medical record. The patient was accompanied to the clinic by self.    History of Present Illness: Carolyn Brennan is a 74 y.o. right-handed Caucasian female with myeloproliferative disorder, anxiety/depression, and ADHD presenting for evaluation of hand weakness.  The patient is unsure why she is here today.    Upon more direct questioning about the referral for hand weakness, she states that its been there for a long time and attributed to arthritis.  She has associated difficulty opening jars and soda in additional to achy pain of the fingers and wrist.  Hands are stiff in the morning.  She reports having long history of intermittent right > left hand numbness, which can occur at rest or with activity.  She does not pay attention to anything that exacerbates or alleviates symptoms. She denies any neck pain.  She is concerned about dementia because of inability to focus and feeling as if she has "foggy" thinking.  She lives alone, manages all her finances and household duties.  She drives and has not been involved in any accidents.    There is significant depression and she recently self discontinued her Prozac and now feels worse.  She does not wish to restart it.  She reports to being suicidal in the past, but denies it currently.  She had not seen a psychiatrist, but clearly needs to.  Mood is low and she does not have much interest, saying that she has to force her self to watch crime shows on netflix.    Out-side paper records, electronic medical record, and images have been reviewed where available and summarized as:  BMP Latest Ref Rng  04/14/2015 12/27/2014 08/23/2014  Glucose 70 - 140 mg/dl 104 102 87  BUN 7.0 - 26.0 mg/dL 16.5 18.3 23  Creatinine 0.6 - 1.1 mg/dL 0.8 0.8 0.81  Sodium 136 - 145 mEq/L 139 139 140  Potassium 3.5 - 5.1 mEq/L 4.3 4.1 4.4  Chloride 96 - 112 mEq/L - - 107  CO2 22 - 29 mEq/L 23 21(L) 24  Calcium 8.4 - 10.4 mg/dL 9.5 8.8 9.2    Lab Results  Component Value Date   FERRITIN 491* 04/14/2015     Past Medical History  Diagnosis Date  . MDS/MPN (myelodysplastic/myeloproliferative neoplasms) ([place]) 08/12/2011  . Anxiety and depression 08/12/2011  . Anemia   . Iron overload due to repeated red blood cell transfusions 07/21/2013  . 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 Neck plastic surgery    . Abdominoplasty/panniculectomy    . Tubal ligation    . Breast reduction surgery    . Appendectomy    . Tee without cardioversion N/A 11/19/2012    Procedure: TRANSESOPHAGEAL ECHOCARDIOGRAM (TEE);  Surgeon: Jettie Booze, MD;  Location: [facility] ENDOSCOPY;  Service: Cardiovascular;  Laterality: N/A;     Medications:  Outpatient Encounter Prescriptions as of 05/18/2015  Medication Sig  . ergocalciferol (VITAMIN D2) 50000 UNITS capsule Take 50,000 Units by mouth 2 (two) times a week. On mondays and fridays  . LORazepam (ATIVAN) 0.5 MG tablet Take  1 tablet (0.5 mg total) by mouth daily as needed for anxiety or sleep. Or nausea.  . lidocaine-prilocaine (EMLA) cream Apply topically as needed. Apply over port area 1-2 hours before treatment and cover with plastic wrap (Patient not taking: Reported on 05/18/2015)  . [DISCONTINUED] Vitamin D, Cholecalciferol, 1000 UNITS TABS Take 1,000 Units by mouth daily.   No facility-administered encounter medications on file as of 05/18/2015.     Allergies: No Known Allergies  Family History: Family History  Problem Relation Age of Onset  . Breast cancer Mother   . Colon cancer Father  53    Social History: Social History  Substance Use Topics  . Smoking status: Former Smoker    Start date: 04/16/1992    Quit date: 12/16/2006  . Smokeless tobacco: Never Used  . Alcohol Use: No   Social History   Social History Narrative   She lives alone in a [place] home.  No children.   Parents were holocaust survivors.     Review of Systems:  CONSTITUTIONAL: No fevers, chills, night sweats, or weight loss.   EYES: No visual changes or eye pain ENT: No hearing changes.  No history of nose bleeds.   RESPIRATORY: No cough, wheezing and shortness of breath.   CARDIOVASCULAR: Negative for chest pain, and palpitations.   GI: Negative for abdominal discomfort, blood in stools or black stools.  No recent change in bowel habits.   GU:  No history of incontinence.   MUSCLOSKELETAL: +history of joint pain or swelling.  No myalgias.   SKIN: Negative for lesions, rash, and itching.   HEMATOLOGY/ONCOLOGY: Negative for prolonged bleeding, bruising easily, and swollen nodes.  +history of cancer.   ENDOCRINE: Negative for cold or heat intolerance, polydipsia or goiter.   PSYCH:  +depression or anxiety symptoms.   NEURO: As Above.   Vital Signs:  BP 132/72 mmHg  Pulse 89  Resp 18  Wt 171 lb (77.565 kg)  SpO2 94%   General Medical Exam:   General:  Well appearing, comfortable.   Eyes/ENT: see cranial nerve examination.   Neck: No masses appreciated.  Full range of motion without tenderness.  No carotid bruits. Respiratory:  Clear to auscultation, good air entry bilaterally.   Cardiac:  Regular rate and rhythm, no murmur.   Extremities:  No deformities, edema, or skin discoloration.  Skin:  No rashes or lesions.  Neurological Exam: MENTAL STATUS including orientation to time, place, person, recent and remote memory.  Poor attention.  Speech is not dysarthric, tangential thought process.  Montreal Cognitive Assessment  05/18/2015  Visuospatial/ Executive (0/5) 4  Naming (0/3)  3  Attention: Read list of digits (0/2) 2  Attention: Read list of letters (0/1) 1  Attention: Serial 7 subtraction starting at 100 (0/3) 3  Language: Repeat phrase (0/2) 2  Language : Fluency (0/1) 1  Abstraction (0/2) 1  Delayed Recall (0/5) 5  Orientation (0/6) 6  Total 28  Adjusted Score (based on education) 28   CRANIAL NERVES: II:  No visual field defects.  Unremarkable fundi.   III-IV-VI: Pupils equal round and reactive to light.  Normal conjugate, extra-ocular eye movements in all directions of gaze.  No nystagmus.  No ptosis.   V:  Normal facial sensation.   VII:  Normal facial symmetry and movements.  No pathologic facial reflexes.  VIII:  Normal hearing and vestibular function.   IX-X:  Normal palatal movement.   XI:  Normal shoulder shrug and head rotation.  XII:  Normal tongue strength and range of motion, no deviation or fasciculation.  MOTOR:  There is a low amplitude and regular resting head tremor. No pronator drift.  Tone is normal.    Right Upper Extremity:    Left Upper Extremity:    Deltoid  5/5   Deltoid  5/5   Biceps  5/5   Biceps  5/5   Triceps  5/5   Triceps  5/5   Wrist extensors  5/5   Wrist extensors  5/5   Wrist flexors  5/5   Wrist flexors  5/5   Finger extensors  5/5   Finger extensors  5/5   Finger flexors  5/5   Finger flexors  5/5   Dorsal interossei  5-/5   Dorsal interossei  5-/5   Abductor pollicis  5/5   Abductor pollicis  5/5   Tone (Ashworth scale)  0  Tone (Ashworth scale)  0   Right Lower Extremity:    Left Lower Extremity:    Hip flexors  5/5   Hip flexors  5/5   Hip extensors  5/5   Hip extensors  5/5   Knee flexors  5/5   Knee flexors  5/5   Knee extensors  5/5   Knee extensors  5/5   Dorsiflexors  5/5   Dorsiflexors  5/5   Plantarflexors  5/5   Plantarflexors  5/5   Toe extensors  5/5   Toe extensors  5/5   Toe flexors  5/5   Toe flexors  5/5   Tone (Ashworth scale)  0  Tone (Ashworth scale)  0   MSRs:  Right                                                                  Left brachioradialis 3+  brachioradialis 3+  biceps 2+  biceps 2+  triceps 3+  triceps 3+  patellar 2+  patellar 2+  ankle jerk 2+  ankle jerk 2+  Hoffman no  Hoffman no  plantar response down  plantar response down   SENSORY:  Normal and symmetric perception of light touch, pinprick, vibration, and proprioception.  Romberg's sign absent.   COORDINATION/GAIT: Normal finger-to- nose-finger and heel-to-shin.  Intact rapid alternating movements bilaterally.  Able to rise from a chair without using arms.  Gait narrow based and stable.    IMPRESSION: Ms. Giglia is a 74 year-old female referred for evaluation of hand weakness and memory problems.  Her exam discloses mild weakness of the intrinsic hand muscles and slightly brisk upper extremity reflexes which is suggestive of possible cervical canal stenosis.  These symptoms do not seem to interfere with her daily functioning and her paresthesias are infrequent to warrant imaging or NCS/EMG.  I offered occupational therapy for hand strengthening, but she already know what exercises to do and admits to being non-complaint.  She would like to restart her own exercises. If her symptoms worsen, additional testing can be done.  Regarding her cognitive difficulty, I reassured her that there is no evidence of dementia syndrome based on her cognitive assessment where she scored very well.  Difficulty with concentration and attention is due to underlying mood disorder and I strongly recommend that she establish care with psychiatry.   For  completeness, I will screen her for vitamin B12 deficiency and thyroid dysfunction.  Finally, her exam shows mild resting head tremor.  This is not bothersome, so no need for medications.    We will call her with the results of her blood work.  Return to clinic as needed.   The duration of this appointment visit was 45 minutes of face-to-face time with the patient.  Greater  than 50% of this time was spent in counseling, explanation of diagnosis, planning of further management, and coordination of care.   Thank you for allowing me to participate in patient's care.  If I can answer any additional questions, I would be pleased to do so.    Sincerely,    Donika K. Posey Pronto, DO

## 2015-05-18 NOTE — Patient Instructions (Addendum)
We will call you with the results of your blood work Start hand exercises to try to strengthen your hands If the numbness/tingling worsens, please call my office and we can schedule additional testing Please establish care with a psychiatrist and/or psychologist as many of your symptoms are stemming from mood disorder

## 2015-05-18 NOTE — Progress Notes (Signed)
Note routed

## 2015-05-19 ENCOUNTER — Telehealth: Payer: Self-pay | Admitting: *Deleted

## 2015-05-19 NOTE — Telephone Encounter (Signed)
Called pt - no answer; left message sounds like bronchitis, which is viral, and to continues what she is doing per Dr Beryle Beams. But to call us or PCP if condition changes.

## 2015-05-19 NOTE — Telephone Encounter (Signed)
Call from pt - not feeling well; c/o coughing, weakness, chest tightness, wheezing at night. She saw PCP last week and Neurologist yesterday. States she was told she's fine and to try lozenges, fluids,humidifier - which she has started. States coughing is a little better today. Wonders if she has bronchitis. Last Hgb 6.9 and next lab appt is next Thursday.

## 2015-05-19 NOTE — Telephone Encounter (Signed)
Yes - it sounds like she does have bronchitis - this is usually viral and if her primary did not start an antibiotic and cough already less - I agree with just conservative Rx.

## 2015-05-19 NOTE — Telephone Encounter (Signed)
Pt called / left message - stated Dr Inda Merlin' office prescribed ABX and codeine syrup.

## 2015-05-20 ENCOUNTER — Ambulatory Visit: Payer: Medicare Other | Admitting: Neurology

## 2015-05-26 ENCOUNTER — Ambulatory Visit (HOSPITAL_BASED_OUTPATIENT_CLINIC_OR_DEPARTMENT_OTHER): Payer: Medicare Other

## 2015-05-26 ENCOUNTER — Other Ambulatory Visit: Payer: Self-pay | Admitting: Nurse Practitioner

## 2015-05-26 ENCOUNTER — Other Ambulatory Visit: Payer: Self-pay | Admitting: Oncology

## 2015-05-26 ENCOUNTER — Encounter: Payer: Self-pay | Admitting: Nurse Practitioner

## 2015-05-26 ENCOUNTER — Other Ambulatory Visit (HOSPITAL_BASED_OUTPATIENT_CLINIC_OR_DEPARTMENT_OTHER): Payer: Medicare Other

## 2015-05-26 ENCOUNTER — Ambulatory Visit (HOSPITAL_COMMUNITY)
Admission: RE | Admit: 2015-05-26 | Discharge: 2015-05-26 | Disposition: A | Discharge status: Home / Self Care | Payer: Medicare Other | Source: Ambulatory Visit | Attending: Nurse Practitioner | Admitting: Nurse Practitioner

## 2015-05-26 ENCOUNTER — Other Ambulatory Visit: Payer: Self-pay | Admitting: *Deleted

## 2015-05-26 ENCOUNTER — Ambulatory Visit (HOSPITAL_BASED_OUTPATIENT_CLINIC_OR_DEPARTMENT_OTHER): Payer: Medicare Other | Admitting: Nurse Practitioner

## 2015-05-26 ENCOUNTER — Ambulatory Visit (HOSPITAL_COMMUNITY)
Admission: RE | Admit: 2015-05-26 | Discharge: 2015-05-26 | Disposition: A | Discharge status: Home / Self Care | Payer: Medicare Other | Source: Ambulatory Visit | Attending: Oncology | Admitting: Oncology

## 2015-05-26 VITALS — BP 102/44 | HR 89 | Temp 98.1°F | Resp 18 | Ht 62.0 in | Wt 172.0 lb

## 2015-05-26 VITALS — BP 127/43 | HR 84 | Temp 98.1°F | Resp 18

## 2015-05-26 DIAGNOSIS — D469 Myelodysplastic syndrome, unspecified: Secondary | ICD-10-CM | POA: Diagnosis present

## 2015-05-26 DIAGNOSIS — R05 Cough: Secondary | ICD-10-CM | POA: Insufficient documentation

## 2015-05-26 DIAGNOSIS — R0989 Other specified symptoms and signs involving the circulatory and respiratory systems: Secondary | ICD-10-CM | POA: Diagnosis not present

## 2015-05-26 DIAGNOSIS — R059 Cough, unspecified: Secondary | ICD-10-CM

## 2015-05-26 DIAGNOSIS — D6489 Other specified anemias: Secondary | ICD-10-CM

## 2015-05-26 DIAGNOSIS — J40 Bronchitis, not specified as acute or chronic: Secondary | ICD-10-CM | POA: Insufficient documentation

## 2015-05-26 DIAGNOSIS — C946 Myelodysplastic disease, not classified: Secondary | ICD-10-CM | POA: Insufficient documentation

## 2015-05-26 LAB — COMPREHENSIVE METABOLIC PANEL (CC13)
ALBUMIN: 4 g/dL (ref 3.5–5.0)
ALK PHOS: 81 U/L (ref 40–150)
ALT: <9 U/L (ref 0–55)
AST: 11 U/L (ref 5–34)
Anion Gap: 9 mEq/L (ref 3–11)
BUN: 17.4 mg/dL (ref 7.0–26.0)
CALCIUM: 9.3 mg/dL (ref 8.4–10.4)
CHLORIDE: 107 meq/L (ref 98–109)
CO2: 23 mEq/L (ref 22–29)
Creatinine: 0.8 mg/dL (ref 0.6–1.1)
EGFR: 68 mL/min/{1.73_m2} — AB (ref >90)
Glucose: 121 mg/dl (ref 70–140)
POTASSIUM: 4.5 meq/L (ref 3.5–5.1)
Sodium: 139 mEq/L (ref 136–145)
TOTAL PROTEIN: 6.3 g/dL — AB (ref 6.4–8.3)
Total Bilirubin: 4.43 mg/dL (ref 0.20–1.20)

## 2015-05-26 LAB — CBC WITH DIFFERENTIAL/PLATELET
BASO%: 0.7 % (ref 0.0–2.0)
BASOS ABS: 0.1 10*3/uL (ref 0.0–0.1)
EOS ABS: 0 10*3/uL (ref 0.0–0.5)
EOS%: 0.4 % (ref 0.0–7.0)
HCT: 17.8 % — ABNORMAL LOW (ref 34.8–46.6)
HGB: 5.2 g/dL — CL (ref 11.6–15.9)
LYMPH%: 25.4 % (ref 14.0–49.7)
MCH: 32.5 pg (ref 25.1–34.0)
MCHC: 29.2 g/dL — AB (ref 31.5–36.0)
MCV: 111.3 fL — AB (ref 79.5–101.0)
MONO#: 0.5 10*3/uL (ref 0.1–0.9)
MONO%: 6.5 % (ref 0.0–14.0)
NEUT#: 5.1 10*3/uL (ref 1.5–6.5)
NEUT%: 67 % (ref 38.4–76.8)
RBC: 1.6 10*6/uL — AB (ref 3.70–5.45)
RDW: 27 % — ABNORMAL HIGH (ref 11.2–14.5)
WBC: 7.6 10*3/uL (ref 3.9–10.3)
lymph#: 1.9 10*3/uL (ref 0.9–3.3)
nRBC: 4 % — ABNORMAL HIGH (ref 0–0)

## 2015-05-26 LAB — PREPARE RBC (CROSSMATCH)

## 2015-05-26 LAB — TECHNOLOGIST REVIEW

## 2015-05-26 LAB — HOLD TUBE, BLOOD BANK

## 2015-05-26 MED ORDER — SODIUM CHLORIDE 0.9 % IV SOLN
250.0000 mL | Freq: Once | INTRAVENOUS | Status: AC
  Administered 2015-05-26: 250 mL via INTRAVENOUS

## 2015-05-26 MED ORDER — HEPARIN SOD (PORK) LOCK FLUSH 100 UNIT/ML IV SOLN
500.0000 [IU] | Freq: Every day | INTRAVENOUS | Status: AC | PRN
  Administered 2015-05-26: 500 [IU]
  Filled 2015-05-26: qty 5

## 2015-05-26 MED ORDER — LORAZEPAM 2 MG/ML IJ SOLN
INTRAMUSCULAR | Status: AC
  Filled 2015-05-26: qty 1

## 2015-05-26 MED ORDER — SODIUM CHLORIDE 0.9 % IJ SOLN
10.0000 mL | INTRAMUSCULAR | Status: AC | PRN
  Administered 2015-05-26: 10 mL
  Filled 2015-05-26: qty 10

## 2015-05-26 MED ORDER — LEVOFLOXACIN 500 MG PO TABS: 500.0000 mg | ORAL_TABLET | Freq: Every day | ORAL | Status: AC

## 2015-05-26 MED ORDER — LORAZEPAM 2 MG/ML IJ SOLN
0.5000 mg | Freq: Once | INTRAMUSCULAR | Status: AC
  Administered 2015-05-26: 0.5 mg via INTRAVENOUS

## 2015-05-26 NOTE — Assessment & Plan Note (Signed)
Bilirubin has increased from 3.87 up to 4.43.  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  We'll continue to monitor closely.

## 2015-05-26 NOTE — Patient Instructions (Signed)

## 2015-05-26 NOTE — Assessment & Plan Note (Signed)
Patient reports continued/progressive symptoms of bronchitis; despite finishing Zithromax several days ago.  She continues with a congested cough that is keeping her up at night.  She states she is coughing so much now; that she is occasionally coughing up some blood.  She denies any recent fevers or chills.  Patient also denies any chest pain, chest pressure, shortness of breath, or pain with inspiration.    On exam- patient's with essentially clear breath sounds bilaterally.  Patient does have a very congested cough; but no wheezes noted.  There is no acute shortness of breath or respiratory distress noted.  Chest x-ray obtained today revealed:   IMPRESSION: No edema or consolidation.  Advised patient would prescribe Levaquin antibiotics for further treatment of continued/chronic bronchitis.  Patient was advised to call/return to go directly to the emergency department for any worsening symptoms whatsoever.

## 2015-05-26 NOTE — Progress Notes (Signed)
SYMPTOM MANAGEMENT CLINIC   HPI: Carolyn Brennan 74 y.o. female diagnosed with myelodysplastic disease.  Currently undergoing observation only; as well as transfusional support on an as-needed basis.  Patient reports continued/progressive symptoms of bronchitis; despite finishing Zithromax several days ago.  She continues with a congested cough that is keeping her up at night.  She states she is coughing so much now; that she is occasionally coughing up some blood.  She denies any recent fevers or chills.  Patient also denies any chest pain, chest pressure, shortness of breath, or pain with inspiration.    HPI  ROS  Past Medical History  Diagnosis Date  . MDS/MPN (myelodysplastic/myeloproliferative neoplasms) (HCC) 08/12/2011  . Anxiety and depression 08/12/2011  . Anemia   . Iron overload due to repeated red blood cell transfusions 07/21/2013  . 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 Neck plastic surgery    . Abdominoplasty/panniculectomy    . Tubal ligation    . Breast reduction surgery    . Appendectomy    . Tee without cardioversion N/A 11/19/2012    Procedure: TRANSESOPHAGEAL ECHOCARDIOGRAM (TEE);  Surgeon: Corky Crafts, MD;  Location: [facility] ENDOSCOPY;  Service: Cardiovascular;  Laterality: N/A;    has Anemia; MDS/MPN (myelodysplastic/myeloproliferative neoplasms) (HCC); Staphylococcus aureus bacteremia; Hypokalemia; Anxiety and depression; Hepatosplenomegaly on CT abdomen 11/17/12; Iron overload due to repeated red blood cell transfusions; Hyperbilirubinemia; and Bronchitis on her problem list.    has No Known Allergies.    Medication List       This list is accurate as of: 05/26/15  2:55 PM.  Always use your most recent med list.               ergocalciferol 50000 UNITS capsule  Commonly known as:  VITAMIN D2  Take 50,000 Units by mouth 2 (two) times a week. On mondays and  fridays     levofloxacin 500 MG tablet  Commonly known as:  LEVAQUIN  Take 1 tablet (500 mg total) by mouth daily.     lidocaine-prilocaine cream  Commonly known as:  EMLA  Apply topically as needed. Apply over port area 1-2 hours before treatment and cover with plastic wrap     LORazepam 0.5 MG tablet  Commonly known as:  ATIVAN  Take 1 tablet (0.5 mg total) by mouth daily as needed for anxiety or sleep. Or nausea.         PHYSICAL EXAMINATION  Oncology Vitals 05/26/2015 05/26/2015  Height - -  Weight - -  Weight (lbs) - -  BMI (kg/m2) - -  Temp 98.2 98.6  Pulse 86 88  Resp 16 20  Resp (Historical as of 02/14/12) - -  SpO2 100 100  BSA (m2) - -   BP Readings from Last 2 Encounters:  05/26/15 122/41  05/26/15 102/44    Physical Exam  Constitutional: She is oriented to person, place, and time.  Patient appears fatigued, weak, and chronically ill.  HENT:  Head: Normocephalic and atraumatic.  Mouth/Throat: Oropharynx is clear and moist.  Eyes: Conjunctivae and EOM are normal. Pupils are equal, round, and reactive to light. Right eye exhibits no discharge. Left eye exhibits no discharge. No scleral icterus.  Neck: Normal range of motion. Neck supple. No JVD present. No tracheal deviation present. No thyromegaly present.  Cardiovascular: Normal rate, regular rhythm, normal heart sounds and intact distal pulses.   Pulmonary/Chest: Effort normal  and breath sounds normal. No respiratory distress. She has no wheezes. She has no rales. She exhibits no tenderness.  Patient has a very congested cough.  Abdominal: Soft. Bowel sounds are normal. She exhibits no distension and no mass. There is no tenderness. There is no rebound and no guarding.  Musculoskeletal: Normal range of motion. She exhibits no edema or tenderness.  Lymphadenopathy:    She has no cervical adenopathy.  Neurological: She is alert and oriented to person, place, and time.  Skin: Skin is warm. No rash noted. No  erythema. There is pallor.  Psychiatric: Affect normal.  Nursing note and vitals reviewed.   LABORATORY DATA:. Appointment on 05/26/2015  Component Date Value Ref Range Status  . WBC 05/26/2015 7.6  3.9 - 10.3 10e3/uL Final  . NEUT# 05/26/2015 5.1  1.5 - 6.5 10e3/uL Final  . HGB 05/26/2015 5.2* 11.6 - 15.9 g/dL Final  . HCT 05/26/2015 17.8* 34.8 - 46.6 % Final  . Platelets 05/26/2015 120 Large & giant platelets* 145 - 400 10e3/uL Final  . MCV 05/26/2015 111.3* 79.5 - 101.0 fL Final  . MCH 05/26/2015 32.5  25.1 - 34.0 pg Final  . MCHC 05/26/2015 29.2* 31.5 - 36.0 g/dL Final  . RBC 05/26/2015 1.60* 3.70 - 5.45 10e6/uL Final  . RDW 05/26/2015 27.0* 11.2 - 14.5 % Final  . lymph# 05/26/2015 1.9  0.9 - 3.3 10e3/uL Final  . MONO# 05/26/2015 0.5  0.1 - 0.9 10e3/uL Final  . Eosinophils Absolute 05/26/2015 0.0  0.0 - 0.5 10e3/uL Final  . Basophils Absolute 05/26/2015 0.1  0.0 - 0.1 10e3/uL Final  . NEUT% 05/26/2015 67.0  38.4 - 76.8 % Final  . LYMPH% 05/26/2015 25.4  14.0 - 49.7 % Final  . MONO% 05/26/2015 6.5  0.0 - 14.0 % Final  . EOS% 05/26/2015 0.4  0.0 - 7.0 % Final  . BASO% 05/26/2015 0.7  0.0 - 2.0 % Final  . nRBC 05/26/2015 4* 0 - 0 % Final  . Hold Tube, Blood Bank 05/26/2015 Type and Crossmatch Added   Final  . Technologist Review 05/26/2015 few Metas and Myelocytes, 2% blasts, few teardrops and schistocytes, mod ovalos present   Final  . Sodium 05/26/2015 139  136 - 145 mEq/L Final  . Potassium 05/26/2015 4.5  3.5 - 5.1 mEq/L Final  . Chloride 05/26/2015 107  98 - 109 mEq/L Final  . CO2 05/26/2015 23  22 - 29 mEq/L Final  . Glucose 05/26/2015 121  70 - 140 mg/dl Final   Glucose reference range is for nonfasting patients. Fasting glucose reference range is 70- 100.  [name] BUN 05/26/2015 17.4  7.0 - 26.0 mg/dL Final  . Creatinine 05/26/2015 0.8  0.6 - 1.1 mg/dL Final  . Total Bilirubin 05/26/2015 4.43* 0.20 - 1.20 mg/dL Final  . Alkaline Phosphatase 05/26/2015 81  40 - 150 U/L Final    . AST 05/26/2015 11  5 - 34 U/L Final  . ALT 05/26/2015 <9  0 - 55 U/L Final  . Total Protein 05/26/2015 6.3* 6.4 - 8.3 g/dL Final  . Albumin 05/26/2015 4.0  3.5 - 5.0 g/dL Final  . Calcium 05/26/2015 9.3  8.4 - 10.4 mg/dL Final  . Anion Gap 05/26/2015 9  3 - 11 mEq/L Final  . EGFR 05/26/2015 68* >90 ml/min/1.73 m2 Final   eGFR is calculated using the CKD-EPI Creatinine Equation (2009)  Hospital Outpatient Visit on 05/26/2015  Component Date Value Ref Range Status  . Order Confirmation 05/26/2015 ORDER  PROCESSED BY BLOOD BANK   Final  . ABO/RH(D) 05/26/2015 A POS   Final  . Antibody Screen 05/26/2015 NEG   Final  . Sample Expiration 05/26/2015 05/29/2015   Final  . Unit Number 05/26/2015 [id number]   Final  . Blood Component Type 05/26/2015 RED CELLS,LR   Final  . Unit division 05/26/2015 00   Final  . Status of Unit 05/26/2015 ISSUED   Final  . Transfusion Status 05/26/2015 OK TO TRANSFUSE   Final  . Crossmatch Result 05/26/2015 Compatible   Final  . Unit Number 05/26/2015 [id number]   Final  . Blood Component Type 05/26/2015 RED CELLS,LR   Final  . Unit division 05/26/2015 00   Final  . Status of Unit 05/26/2015 ISSUED   Final  . Transfusion Status 05/26/2015 OK TO TRANSFUSE   Final  . Crossmatch Result 05/26/2015 Compatible   Final     RADIOGRAPHIC STUDIES: Dg Chest 2 View  05/26/2015  CLINICAL DATA:  Cough and chest congestion EXAM: CHEST  2 VIEW COMPARISON:  October 14, 2013 FINDINGS: Port-A-Cath tip is in the superior vena cava. No pneumothorax. There is no edema or consolidation. Heart size is upper normal with pulmonary vascularity within normal limits. No adenopathy. 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 IMPRESSION: No edema or consolidation. Electronically Signed   By: Lowella Grip III M.D.   On: 05/26/2015 09:54    ASSESSMENT/PLAN:    MDS/MPN (myelodysplastic/myeloproliferative neoplasms) ([place]) Patient continues undergoing observation only;  and receives transfusional support on an as-needed basis.  Patient reports continued/progressive symptoms of bronchitis; despite finishing Zithromax several days ago.  She continues with a congested cough that is keeping her up at night.  She states she is coughing so much now; that she is occasionally coughing up some blood.  She denies any recent fevers or chills.  Patient also denies any chest pain, chest pressure, shortness of breath, or pain with inspiration.  Blood counts obtained today revealed a WBC of 7.6, ANC 5.1, hemoglobin down to 5.2, and platelet count 120.  Due to patient's severe anemia-patient will receive 2 units packed red blood cells today.  She will be premedicated with Ativan IV.  Platelet count slightly low at 120; and her hemoptysis is most likely secondary to low platelet count and a chronic coughing.  Please see further notes regarding treatment of bronchitis.  Patient is scheduled to return on 06/16/2015 for labs.  She will return again on 07/07/15 for labs as well.  She is scheduled to return on 08/22/2015 for follow-up visit.  Hyperbilirubinemia Bilirubin has increased from 3.87 up to 4.43.  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  We'll continue to monitor closely.  Bronchitis Patient reports continued/progressive symptoms of bronchitis; despite finishing Zithromax several days ago.  She continues with a congested cough that is keeping her up at night.  She states she is coughing so much now; that she is occasionally coughing up some blood.  She denies any recent fevers or chills.  Patient also denies any chest pain, chest pressure, shortness of breath, or pain with inspiration.    On exam- patient's with essentially clear breath sounds bilaterally.  Patient does have a very congested cough; but no wheezes noted.  There is no acute shortness of breath or respiratory distress noted.  Chest x-ray obtained today revealed:     IMPRESSION: No edema or consolidation.  Advised patient would prescribe Levaquin antibiotics for further treatment of continued/chronic bronchitis.  Patient was advised to call/return to go directly to the emergency department for any worsening symptoms whatsoever.         Patient stated understanding of all instructions; and was in agreement with this plan of care. The patient knows to call the clinic with any problems, questions or concerns.   Review/collaboration with Dr. Beryle Beams regarding all aspects of patient's visit today.   Total time spent with patient was 25 minutes;  with greater than 75 percent of that time spent in face to face counseling regarding patient's symptoms,  and coordination of care and follow up.  Disclaimer:This dictation was prepared with Dragon/digital dictation along with [employer]. Any transcriptional errors that result from this process are unintentional.  Drue Second, NP 05/26/2015

## 2015-05-26 NOTE — Assessment & Plan Note (Addendum)
Patient continues undergoing observation only; and receives transfusional support on an as-needed basis.  Patient reports continued/progressive symptoms of bronchitis; despite finishing Zithromax several days ago.  She continues with a congested cough that is keeping her up at night.  She states she is coughing so much now; that she is occasionally coughing up some blood.  She denies any recent fevers or chills.  Patient also denies any chest pain, chest pressure, shortness of breath, or pain with inspiration.  Blood counts obtained today revealed a WBC of 7.6, ANC 5.1, hemoglobin down to 5.2, and platelet count 120.  Due to patient's severe anemia-patient will receive 2 units packed red blood cells today.  She will be premedicated with Ativan IV.  Platelet count slightly low at 120; and her hemoptysis is most likely secondary to low platelet count and a chronic coughing.  Please see further notes regarding treatment of bronchitis.  Patient is scheduled to return on 06/16/2015 for labs.  She will return again on 07/07/15 for labs as well.  She is scheduled to return on 08/22/2015 for follow-up visit.

## 2015-05-27 LAB — TYPE AND SCREEN
ABO/RH(D): A POS
ANTIBODY SCREEN: NEGATIVE
Unit division: 0
Unit division: 0

## 2015-06-16 ENCOUNTER — Other Ambulatory Visit: Payer: Self-pay | Admitting: Oncology

## 2015-06-16 ENCOUNTER — Other Ambulatory Visit (HOSPITAL_BASED_OUTPATIENT_CLINIC_OR_DEPARTMENT_OTHER): Payer: Medicare Other

## 2015-06-16 DIAGNOSIS — D469 Myelodysplastic syndrome, unspecified: Secondary | ICD-10-CM | POA: Diagnosis present

## 2015-06-16 DIAGNOSIS — D6489 Other specified anemias: Secondary | ICD-10-CM

## 2015-06-16 LAB — CBC WITH DIFFERENTIAL/PLATELET
BASO%: 0.3 % (ref 0.0–2.0)
BASOS ABS: 0 10*3/uL (ref 0.0–0.1)
EOS%: 0.4 % (ref 0.0–7.0)
Eosinophils Absolute: 0 10*3/uL (ref 0.0–0.5)
HCT: 22.1 % — ABNORMAL LOW (ref 34.8–46.6)
HGB: 6.7 g/dL — CL (ref 11.6–15.9)
LYMPH%: 23.5 % (ref 14.0–49.7)
MCH: 30.7 pg (ref 25.1–34.0)
MCHC: 30.3 g/dL — AB (ref 31.5–36.0)
MCV: 101.4 fL — AB (ref 79.5–101.0)
MONO#: 0.5 10*3/uL (ref 0.1–0.9)
MONO%: 5.7 % (ref 0.0–14.0)
NEUT#: 6.4 10*3/uL (ref 1.5–6.5)
NEUT%: 70.1 % (ref 38.4–76.8)
NRBC: 2 % — AB (ref 0–0)
Platelets: 100 10*3/uL — ABNORMAL LOW (ref 145–400)
RBC: 2.18 10*6/uL — AB (ref 3.70–5.45)
RDW: 29.2 % — AB (ref 11.2–14.5)
WBC: 9.2 10*3/uL (ref 3.9–10.3)
lymph#: 2.2 10*3/uL (ref 0.9–3.3)

## 2015-06-16 LAB — TECHNOLOGIST REVIEW

## 2015-06-16 LAB — HOLD TUBE, BLOOD BANK

## 2015-06-17 ENCOUNTER — Ambulatory Visit (HOSPITAL_COMMUNITY)
Admission: RE | Admit: 2015-06-17 | Discharge: 2015-06-17 | Disposition: A | Discharge status: Home / Self Care | Payer: Medicare Other | Source: Ambulatory Visit | Attending: Oncology | Admitting: Oncology

## 2015-06-17 DIAGNOSIS — C946 Myelodysplastic disease, not classified: Secondary | ICD-10-CM | POA: Insufficient documentation

## 2015-06-17 DIAGNOSIS — D649 Anemia, unspecified: Secondary | ICD-10-CM

## 2015-06-17 DIAGNOSIS — D469 Myelodysplastic syndrome, unspecified: Secondary | ICD-10-CM

## 2015-06-21 ENCOUNTER — Ambulatory Visit (HOSPITAL_BASED_OUTPATIENT_CLINIC_OR_DEPARTMENT_OTHER): Payer: Medicare Other

## 2015-06-21 ENCOUNTER — Other Ambulatory Visit: Payer: Self-pay | Admitting: *Deleted

## 2015-06-21 VITALS — BP 144/47 | HR 67 | Temp 98.2°F | Resp 18

## 2015-06-21 DIAGNOSIS — D6489 Other specified anemias: Secondary | ICD-10-CM

## 2015-06-21 DIAGNOSIS — D469 Myelodysplastic syndrome, unspecified: Secondary | ICD-10-CM | POA: Diagnosis present

## 2015-06-21 DIAGNOSIS — D479 Neoplasm of uncertain behavior of lymphoid, hematopoietic and related tissue, unspecified: Secondary | ICD-10-CM

## 2015-06-21 DIAGNOSIS — D649 Anemia, unspecified: Secondary | ICD-10-CM

## 2015-06-21 DIAGNOSIS — C946 Myelodysplastic disease, not classified: Secondary | ICD-10-CM | POA: Diagnosis not present

## 2015-06-21 LAB — CBC WITH DIFFERENTIAL/PLATELET
BASO%: 0.6 % (ref 0.0–2.0)
Basophils Absolute: 0.1 10*3/uL (ref 0.0–0.1)
EOS%: 0.3 % (ref 0.0–7.0)
Eosinophils Absolute: 0 10*3/uL (ref 0.0–0.5)
HCT: 22.3 % — ABNORMAL LOW (ref 34.8–46.6)
HEMOGLOBIN: 6.7 g/dL — AB (ref 11.6–15.9)
LYMPH#: 2.1 10*3/uL (ref 0.9–3.3)
LYMPH%: 22.2 % (ref 14.0–49.7)
MCH: 31 pg (ref 25.1–34.0)
MCHC: 30 g/dL — ABNORMAL LOW (ref 31.5–36.0)
MCV: 103.2 fL — ABNORMAL HIGH (ref 79.5–101.0)
MONO#: 0.8 10*3/uL (ref 0.1–0.9)
MONO%: 8.1 % (ref 0.0–14.0)
NEUT#: 6.4 10*3/uL (ref 1.5–6.5)
NEUT%: 68.8 % (ref 38.4–76.8)
NRBC: 4 % — AB (ref 0–0)
Platelets: 95 10*3/uL — ABNORMAL LOW (ref 145–400)
RBC: 2.16 10*6/uL — AB (ref 3.70–5.45)
RDW: 30.5 % — AB (ref 11.2–14.5)
WBC: 9.3 10*3/uL (ref 3.9–10.3)

## 2015-06-21 LAB — PREPARE RBC (CROSSMATCH)

## 2015-06-21 LAB — TECHNOLOGIST REVIEW

## 2015-06-21 LAB — HOLD TUBE, BLOOD BANK

## 2015-06-21 MED ORDER — HEPARIN SOD (PORK) LOCK FLUSH 100 UNIT/ML IV SOLN
250.0000 [IU] | INTRAVENOUS | Status: DC | PRN
  Filled 2015-06-21: qty 5

## 2015-06-21 MED ORDER — SODIUM CHLORIDE 0.9 % IV SOLN
250.0000 mL | Freq: Once | INTRAVENOUS | Status: AC
  Administered 2015-06-21: 250 mL via INTRAVENOUS

## 2015-06-21 MED ORDER — SODIUM CHLORIDE 0.9 % IJ SOLN
10.0000 mL | INTRAMUSCULAR | Status: AC | PRN
  Administered 2015-06-21: 10 mL
  Filled 2015-06-21: qty 10

## 2015-06-21 MED ORDER — HEPARIN SOD (PORK) LOCK FLUSH 100 UNIT/ML IV SOLN
500.0000 [IU] | Freq: Every day | INTRAVENOUS | Status: AC | PRN
  Administered 2015-06-21: 500 [IU]
  Filled 2015-06-21: qty 5

## 2015-06-21 MED ORDER — LORAZEPAM 2 MG/ML IJ SOLN
0.5000 mg | Freq: Once | INTRAMUSCULAR | Status: AC
  Administered 2015-06-21: 0.5 mg via INTRAVENOUS

## 2015-06-21 MED ORDER — LORAZEPAM 2 MG/ML IJ SOLN
INTRAMUSCULAR | Status: AC
  Filled 2015-06-21: qty 1

## 2015-06-21 MED ORDER — SODIUM CHLORIDE 0.9 % IJ SOLN
3.0000 mL | INTRAMUSCULAR | Status: DC | PRN
  Filled 2015-06-21: qty 10

## 2015-06-21 NOTE — Progress Notes (Signed)
Pt reports wt loss of 9 lbs in last 85months. , ptalso reports having pains in her chest which she has attributed to gas. Discussed with pt importance of calling office if symptoms persist.Pt verbalized understanding.

## 2015-06-21 NOTE — Patient Instructions (Signed)

## 2015-06-22 LAB — TYPE AND SCREEN
ABO/RH(D): A POS
ANTIBODY SCREEN: NEGATIVE
UNIT DIVISION: 0
UNIT DIVISION: 0

## 2015-06-23 ENCOUNTER — Other Ambulatory Visit: Payer: Medicare Other

## 2015-06-30 ENCOUNTER — Telehealth: Payer: Self-pay | Admitting: *Deleted

## 2015-06-30 ENCOUNTER — Other Ambulatory Visit: Payer: Self-pay | Admitting: Oncology

## 2015-06-30 MED ORDER — AMPICILLIN 250 MG PO CAPS: 500.0000 mg | ORAL_CAPSULE | Freq: Four times a day (QID) | ORAL | Status: DC

## 2015-06-30 NOTE — Telephone Encounter (Signed)
Pt having teeth cleaned - appt in a month; prescribed Amoxicillin 500 mg before and after each dental procedures in the past.  She has an old rx - states to used before April 2016. Wants to know if she still needs to take Amoxicillin? And will she need a new RX - if so, send it to [facility]. Thanks

## 2015-06-30 NOTE — Telephone Encounter (Signed)
Yes - still need to take ampicillin. I will send E-Rx

## 2015-07-01 NOTE — Telephone Encounter (Signed)
Pt called /informed she will need ABX  And new rx for Ampicillin has been sent to [facility].

## 2015-07-04 ENCOUNTER — Telehealth: Payer: Self-pay | Admitting: *Deleted

## 2015-07-04 NOTE — Telephone Encounter (Signed)
Because she told me on the phone she did not need it!  She had some left over from last Rx. I will need to cancel the ampicillin Rx I sent in & change to amoxicillin

## 2015-07-04 NOTE — Telephone Encounter (Signed)
Pt stated [facility] pharmacy did not receive the new rx for Amoxicillin. Thanks

## 2015-07-04 NOTE — Telephone Encounter (Signed)
I looked up new recommendations: take 2 grams = 4 x 500 mg amoxicillin tablets x 1 hour before dental work and that's all. I called and informed patient.

## 2015-07-06 NOTE — Telephone Encounter (Signed)
Pt was called yesterday; message left.

## 2015-07-07 ENCOUNTER — Other Ambulatory Visit (HOSPITAL_BASED_OUTPATIENT_CLINIC_OR_DEPARTMENT_OTHER): Payer: Medicare Other

## 2015-07-07 ENCOUNTER — Telehealth: Payer: Self-pay | Admitting: *Deleted

## 2015-07-07 DIAGNOSIS — D6489 Other specified anemias: Secondary | ICD-10-CM

## 2015-07-07 DIAGNOSIS — D469 Myelodysplastic syndrome, unspecified: Secondary | ICD-10-CM

## 2015-07-07 DIAGNOSIS — D471 Chronic myeloproliferative disease: Secondary | ICD-10-CM

## 2015-07-07 LAB — CBC WITH DIFFERENTIAL/PLATELET
BASO%: 0.5 % (ref 0.0–2.0)
Basophils Absolute: 0.1 10*3/uL (ref 0.0–0.1)
EOS%: 0.3 % (ref 0.0–7.0)
Eosinophils Absolute: 0 10*3/uL (ref 0.0–0.5)
HCT: 25.3 % — ABNORMAL LOW (ref 34.8–46.6)
HGB: 7.9 g/dL — ABNORMAL LOW (ref 11.6–15.9)
LYMPH%: 20.7 % (ref 14.0–49.7)
MCH: 29.8 pg (ref 25.1–34.0)
MCHC: 31.2 g/dL — ABNORMAL LOW (ref 31.5–36.0)
MCV: 95.5 fL (ref 79.5–101.0)
MONO#: 1.2 10*3/uL — ABNORMAL HIGH (ref 0.1–0.9)
MONO%: 9.8 % (ref 0.0–14.0)
NEUT#: 8.1 10*3/uL — ABNORMAL HIGH (ref 1.5–6.5)
NEUT%: 68.7 % (ref 38.4–76.8)
Platelets: 108 10*3/uL — ABNORMAL LOW (ref 145–400)
RBC: 2.65 10*6/uL — ABNORMAL LOW (ref 3.70–5.45)
RDW: 25.8 % — ABNORMAL HIGH (ref 11.2–14.5)
WBC: 11.7 10*3/uL — ABNORMAL HIGH (ref 3.9–10.3)
lymph#: 2.4 10*3/uL (ref 0.9–3.3)
nRBC: 1 % — ABNORMAL HIGH (ref 0–0)

## 2015-07-07 LAB — TECHNOLOGIST REVIEW: Technologist Review: 1

## 2015-07-07 LAB — HOLD TUBE, BLOOD BANK

## 2015-07-07 NOTE — Telephone Encounter (Signed)
Call from Richardson Landry at [place] pt's Hgb 7.9 and pt states she feels fine. Talked to Dr Beryle Beams - hgb great, no need for transfusion, she can go home. Called Richardson Landry back [phone]) - message given.

## 2015-07-25 ENCOUNTER — Other Ambulatory Visit: Payer: Self-pay | Admitting: Oncology

## 2015-07-25 ENCOUNTER — Telehealth: Payer: Self-pay | Admitting: Oncology

## 2015-07-25 DIAGNOSIS — D649 Anemia, unspecified: Secondary | ICD-10-CM

## 2015-07-25 DIAGNOSIS — D469 Myelodysplastic syndrome, unspecified: Secondary | ICD-10-CM

## 2015-07-25 NOTE — Telephone Encounter (Signed)
Spoke with pt regarding lab appt and pt confirmed

## 2015-07-28 ENCOUNTER — Other Ambulatory Visit (HOSPITAL_BASED_OUTPATIENT_CLINIC_OR_DEPARTMENT_OTHER): Payer: Medicare Other

## 2015-07-28 DIAGNOSIS — D649 Anemia, unspecified: Secondary | ICD-10-CM

## 2015-07-28 DIAGNOSIS — D469 Myelodysplastic syndrome, unspecified: Secondary | ICD-10-CM

## 2015-07-28 LAB — CBC WITH DIFFERENTIAL/PLATELET
BASO%: 1.3 % (ref 0.0–2.0)
BASOS ABS: 0.3 10*3/uL — AB (ref 0.0–0.1)
EOS%: 0.5 % (ref 0.0–7.0)
Eosinophils Absolute: 0.1 10*3/uL (ref 0.0–0.5)
HCT: 23.3 % — ABNORMAL LOW (ref 34.8–46.6)
HEMOGLOBIN: 6.9 g/dL — AB (ref 11.6–15.9)
LYMPH%: 17.1 % (ref 14.0–49.7)
MCH: 28.6 pg (ref 25.1–34.0)
MCHC: 29.6 g/dL — ABNORMAL LOW (ref 31.5–36.0)
MCV: 96.7 fL (ref 79.5–101.0)
MONO#: 2.1 10*3/uL — ABNORMAL HIGH (ref 0.1–0.9)
MONO%: 11.1 % (ref 0.0–14.0)
NEUT#: 13.2 10*3/uL — ABNORMAL HIGH (ref 1.5–6.5)
NEUT%: 70 % (ref 38.4–76.8)
Platelets: 120 10*3/uL — ABNORMAL LOW (ref 145–400)
RBC: 2.41 10*6/uL — AB (ref 3.70–5.45)
RDW: 29.1 % — ABNORMAL HIGH (ref 11.2–14.5)
WBC: 18.8 10*3/uL — ABNORMAL HIGH (ref 3.9–10.3)
lymph#: 3.2 10*3/uL (ref 0.9–3.3)
nRBC: 4 % — ABNORMAL HIGH (ref 0–0)

## 2015-07-28 LAB — TECHNOLOGIST REVIEW

## 2015-07-28 LAB — LACTATE DEHYDROGENASE: LDH: 331 U/L — AB (ref 125–245)

## 2015-08-03 ENCOUNTER — Other Ambulatory Visit: Payer: Self-pay | Admitting: *Deleted

## 2015-08-03 ENCOUNTER — Other Ambulatory Visit: Payer: Self-pay | Admitting: Oncology

## 2015-08-03 DIAGNOSIS — D469 Myelodysplastic syndrome, unspecified: Secondary | ICD-10-CM

## 2015-08-03 DIAGNOSIS — D649 Anemia, unspecified: Secondary | ICD-10-CM

## 2015-08-04 ENCOUNTER — Inpatient Hospital Stay (HOSPITAL_COMMUNITY)
Admission: RE | Admit: 2015-08-04 | Discharge: 2015-08-04 | Disposition: A | Discharge status: Home / Self Care | Payer: Medicare Other | Source: Ambulatory Visit | Attending: Oncology | Admitting: Oncology

## 2015-08-04 ENCOUNTER — Encounter (HOSPITAL_COMMUNITY): Payer: Medicare Other

## 2015-08-04 ENCOUNTER — Telehealth: Payer: Self-pay | Admitting: Oncology

## 2015-08-04 ENCOUNTER — Other Ambulatory Visit: Payer: Self-pay | Admitting: *Deleted

## 2015-08-04 ENCOUNTER — Other Ambulatory Visit: Payer: Self-pay | Admitting: Oncology

## 2015-08-04 ENCOUNTER — Other Ambulatory Visit (HOSPITAL_BASED_OUTPATIENT_CLINIC_OR_DEPARTMENT_OTHER): Payer: Medicare Other

## 2015-08-04 ENCOUNTER — Ambulatory Visit (HOSPITAL_COMMUNITY)
Admission: RE | Admit: 2015-08-04 | Discharge: 2015-08-04 | Disposition: A | Discharge status: Home / Self Care | Payer: Medicare Other | Source: Ambulatory Visit | Attending: Oncology | Admitting: Oncology

## 2015-08-04 DIAGNOSIS — D469 Myelodysplastic syndrome, unspecified: Secondary | ICD-10-CM

## 2015-08-04 DIAGNOSIS — C946 Myelodysplastic disease, not classified: Secondary | ICD-10-CM | POA: Diagnosis not present

## 2015-08-04 DIAGNOSIS — D471 Chronic myeloproliferative disease: Secondary | ICD-10-CM

## 2015-08-04 DIAGNOSIS — D649 Anemia, unspecified: Secondary | ICD-10-CM

## 2015-08-04 LAB — CBC WITH DIFFERENTIAL/PLATELET
BASO%: 0.8 % (ref 0.0–2.0)
BASOS ABS: 0.1 10*3/uL (ref 0.0–0.1)
EOS%: 0.4 % (ref 0.0–7.0)
Eosinophils Absolute: 0.1 10*3/uL (ref 0.0–0.5)
HCT: 23.8 % — ABNORMAL LOW (ref 34.8–46.6)
HGB: 7.1 g/dL — ABNORMAL LOW (ref 11.6–15.9)
LYMPH%: 17.5 % (ref 14.0–49.7)
MCH: 28.7 pg (ref 25.1–34.0)
MCHC: 29.8 g/dL — ABNORMAL LOW (ref 31.5–36.0)
MCV: 96.4 fL (ref 79.5–101.0)
MONO#: 1.5 10*3/uL — ABNORMAL HIGH (ref 0.1–0.9)
MONO%: 11.5 % (ref 0.0–14.0)
NEUT#: 9 10*3/uL — ABNORMAL HIGH (ref 1.5–6.5)
NEUT%: 69.8 % (ref 38.4–76.8)
Platelets: 73 10*3/uL — ABNORMAL LOW (ref 145–400)
RBC: 2.47 10*6/uL — AB (ref 3.70–5.45)
RDW: 28.7 % — ABNORMAL HIGH (ref 11.2–14.5)
WBC: 12.9 10*3/uL — ABNORMAL HIGH (ref 3.9–10.3)
lymph#: 2.3 10*3/uL (ref 0.9–3.3)
nRBC: 2 % — ABNORMAL HIGH (ref 0–0)

## 2015-08-04 LAB — TECHNOLOGIST REVIEW

## 2015-08-04 MED ORDER — LORAZEPAM 1 MG PO TABS: 0.5000 mg | ORAL_TABLET | Freq: Once | ORAL | Status: DC

## 2015-08-04 NOTE — Telephone Encounter (Signed)
Spoke with patient re infusion for tomorrow 1/120 @ 11 am. Patient had lab draw today.

## 2015-08-04 NOTE — Progress Notes (Signed)
Called and spoke with patient and she is not coming to the [facility] for her infusion today.  Per patient she told her provider that she wants to continue her infusion at the location she has been going to.  I explained our process here at the [facility], and patient thankful for the clarification.  Patient said she will follow up with Dr. Beryle Beams tomorrow.

## 2015-08-05 ENCOUNTER — Other Ambulatory Visit: Payer: Self-pay | Admitting: *Deleted

## 2015-08-05 ENCOUNTER — Ambulatory Visit (HOSPITAL_BASED_OUTPATIENT_CLINIC_OR_DEPARTMENT_OTHER): Payer: Medicare Other

## 2015-08-05 ENCOUNTER — Encounter: Payer: Self-pay | Admitting: *Deleted

## 2015-08-05 VITALS — BP 103/77 | HR 83 | Temp 98.3°F | Resp 20

## 2015-08-05 DIAGNOSIS — D479 Neoplasm of uncertain behavior of lymphoid, hematopoietic and related tissue, unspecified: Secondary | ICD-10-CM

## 2015-08-05 DIAGNOSIS — D649 Anemia, unspecified: Secondary | ICD-10-CM

## 2015-08-05 DIAGNOSIS — D469 Myelodysplastic syndrome, unspecified: Secondary | ICD-10-CM

## 2015-08-05 DIAGNOSIS — C946 Myelodysplastic disease, not classified: Secondary | ICD-10-CM | POA: Diagnosis not present

## 2015-08-05 LAB — PREPARE RBC (CROSSMATCH)

## 2015-08-05 MED ORDER — HEPARIN SOD (PORK) LOCK FLUSH 100 UNIT/ML IV SOLN
250.0000 [IU] | INTRAVENOUS | Status: AC | PRN
  Administered 2015-08-05: 500 [IU]
  Filled 2015-08-05: qty 5

## 2015-08-05 MED ORDER — FUROSEMIDE 10 MG/ML IJ SOLN
INTRAMUSCULAR | Status: AC
  Filled 2015-08-05: qty 2

## 2015-08-05 MED ORDER — SODIUM CHLORIDE 0.9 % IV SOLN
250.0000 mL | Freq: Once | INTRAVENOUS | Status: AC
  Administered 2015-08-05: 250 mL via INTRAVENOUS

## 2015-08-05 MED ORDER — LORAZEPAM 2 MG/ML IJ SOLN
0.5000 mg | Freq: Once | INTRAMUSCULAR | Status: AC
  Administered 2015-08-05: 0.5 mg via INTRAVENOUS

## 2015-08-05 MED ORDER — FUROSEMIDE 10 MG/ML IJ SOLN: 20.0000 mg | Freq: Once | INTRAMUSCULAR | Status: DC

## 2015-08-05 MED ORDER — SODIUM CHLORIDE 0.9 % IJ SOLN
10.0000 mL | INTRAMUSCULAR | Status: AC | PRN
  Administered 2015-08-05: 10 mL
  Filled 2015-08-05: qty 10

## 2015-08-05 MED ORDER — LORAZEPAM 2 MG/ML IJ SOLN
INTRAMUSCULAR | Status: AC
  Filled 2015-08-05: qty 1

## 2015-08-05 NOTE — Progress Notes (Signed)
[place] [facility]  Clinical Social Work was referred by nurse for assessment of psychosocial needs due concerns about possible SI. Pt in getting transfusion in infusion room and everyone was watching the inauguration. Pt deeply disturbed by this as she is the daughter of holocaust survivors.   Clinical Social Worker met with patient in bed room to offer support and assess for needs.  Pt upset CSW was called, but open to meeting with CSW and exploring emotions.   Pt struggling due to ongoing health issues and not being able to experience life like she used to. Pt denies SI, but does openly discuss not wanting to "live forever". She has strong opinions about how she would like her end of life to be and CSW encouraged pt to discuss this further with her MD, as she appears to want a DNR. Pt does have ADR in chart under media, but HCPOA not listed. Pt does NOT have a plan to end her life, but shared she had been more depressed lately, trouble sleeping at night. She described more anxiety concerns than depression. She states her PCP has encouraged antidepressant use and therapy. CSW encouraged pt to explore both further and she is open to these ideas. She has been getting accupuncture, massage and joined a gym. She feels these are helpful and plans to continue. 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 She was avble to share other ways she receives joy and appeared in better spirits at the end of our discussion. Pt provided with CSW contact and encouraged to reach out as needed. CSW available to complete HCPOA paperwork at future appt as needed.   Clinical Social Work interventions: Programme researcher, broadcasting/film/video education and referral   Loren Racer, [place] Worker [place]  [place] [facility] Phone: [phone] Fax: [phone]

## 2015-08-05 NOTE — Progress Notes (Signed)
Reviewed VS and Lasix 20mg  order with Dr. Beryle Beams. Per Dr. Beryle Beams pt to receive lasix after she has received the 2 units of PRBCs and okay to give with B/P 126/47.

## 2015-08-05 NOTE — Progress Notes (Signed)
During this infusion appointment while speaking with the patient, pt suddenly started to talk of "suicidal" thoughts she has had "for some time now."  Pt hard to understand as topics wax and wane but ultimately when asked if she has plans to harm herself she stated "not right this minute."  I asked the pt if she had a plan to hurt herself and she stated "not with a gun or a knife but I'm going to give myself until the end of February and then I'm going to find someone or some med to take care of it."  When I asked if she needed to speak with someone today about her concerns she went off topic and wouldn't answer question. Pt very upset with current political situation especially given Inauguration taking place today on several nearby tvs of other patients.  Pt stated "this election has especially made me suicidal."  Amy H., C-RN notified of patients statements and this RNs concerns for pt safety.  Decision made to page Beryle Beams, [place] with MD who stated he was aware with patient and similar statements made previously and no new orders given.  Pt currently off of anti-depressives.  Decision made to move patient to private bedroom for remainder of blood transfusion d/t effect of her surroundings and how was affecting her mentally.  Pt in agreement and moved to bedroom with no concerns.  Notified social work, Patent examiner, for additional assistance with this situation to ensure it is taken care of properly.

## 2015-08-05 NOTE — Patient Instructions (Signed)

## 2015-08-06 LAB — TYPE AND SCREEN
ABO/RH(D): A POS
ANTIBODY SCREEN: NEGATIVE
UNIT DIVISION: 0
UNIT DIVISION: 0

## 2015-08-18 ENCOUNTER — Other Ambulatory Visit (HOSPITAL_BASED_OUTPATIENT_CLINIC_OR_DEPARTMENT_OTHER): Payer: Medicare Other

## 2015-08-18 DIAGNOSIS — D469 Myelodysplastic syndrome, unspecified: Secondary | ICD-10-CM

## 2015-08-18 DIAGNOSIS — D649 Anemia, unspecified: Secondary | ICD-10-CM

## 2015-08-18 LAB — CBC WITH DIFFERENTIAL/PLATELET
BASO%: 1.2 % (ref 0.0–2.0)
Basophils Absolute: 0.3 10*3/uL — ABNORMAL HIGH (ref 0.0–0.1)
EOS%: 0.4 % (ref 0.0–7.0)
Eosinophils Absolute: 0.1 10*3/uL (ref 0.0–0.5)
HCT: 25.2 % — ABNORMAL LOW (ref 34.8–46.6)
HGB: 7.9 g/dL — ABNORMAL LOW (ref 11.6–15.9)
LYMPH%: 13 % — AB (ref 14.0–49.7)
MCH: 28.2 pg (ref 25.1–34.0)
MCHC: 31.3 g/dL — AB (ref 31.5–36.0)
MCV: 90.1 fL (ref 79.5–101.0)
MONO#: 3.2 10*3/uL — ABNORMAL HIGH (ref 0.1–0.9)
MONO%: 15.2 % — AB (ref 0.0–14.0)
NEUT%: 70.2 % (ref 38.4–76.8)
NEUTROS ABS: 14.9 10*3/uL — AB (ref 1.5–6.5)
Platelets: 99 10*3/uL — ABNORMAL LOW (ref 145–400)
RBC: 2.8 10*6/uL — AB (ref 3.70–5.45)
RDW: 27.4 % — ABNORMAL HIGH (ref 11.2–14.5)
WBC: 21.2 10*3/uL — AB (ref 3.9–10.3)
lymph#: 2.8 10*3/uL (ref 0.9–3.3)

## 2015-08-18 LAB — TECHNOLOGIST REVIEW

## 2015-08-22 ENCOUNTER — Ambulatory Visit (INDEPENDENT_AMBULATORY_CARE_PROVIDER_SITE_OTHER): Payer: Medicare Other | Admitting: Oncology

## 2015-08-22 ENCOUNTER — Encounter: Payer: Self-pay | Admitting: Oncology

## 2015-08-22 VITALS — BP 145/57 | HR 78 | Temp 97.7°F | Ht 62.0 in | Wt 163.5 lb

## 2015-08-22 DIAGNOSIS — F418 Other specified anxiety disorders: Secondary | ICD-10-CM | POA: Diagnosis not present

## 2015-08-22 DIAGNOSIS — D649 Anemia, unspecified: Secondary | ICD-10-CM

## 2015-08-22 DIAGNOSIS — D469 Myelodysplastic syndrome, unspecified: Secondary | ICD-10-CM

## 2015-08-22 DIAGNOSIS — D471 Chronic myeloproliferative disease: Secondary | ICD-10-CM

## 2015-08-22 DIAGNOSIS — R162 Hepatomegaly with splenomegaly, not elsewhere classified: Secondary | ICD-10-CM | POA: Diagnosis not present

## 2015-08-22 DIAGNOSIS — Z8619 Personal history of other infectious and parasitic diseases: Secondary | ICD-10-CM | POA: Diagnosis not present

## 2015-08-22 MED ORDER — AMPICILLIN 250 MG PO CAPS
2000.0000 mg | ORAL_CAPSULE | Freq: Once | ORAL | Status: AC
Start: 2015-08-22 — End: ?

## 2015-08-22 NOTE — Patient Instructions (Signed)
To lab today Return visit 4 months 

## 2015-08-22 NOTE — Progress Notes (Signed)
Patient ID: Carolyn Brennan, female   DOB: Apr 02, 1941, 75 y.o.   MRN: [id number] Hematology and Oncology Follow Up Visit  Carolyn Brennan [id number] 1941-03-22 75 y.o. 08/22/2015 5:33 PM   Principle Diagnosis: Encounter Diagnoses  Name Primary?  [name] MDS/MPN (myelodysplastic/myeloproliferative neoplasms) ([place]) Yes  . Anemia, unspecified anemia type   . Hepatosplenomegaly on CT abdomen 11/17/12   . Iron overload due to repeated red blood cell transfusions   . Hyperbilirubinemia   Clinical Summary: 75 year old woman with a myeloproliferative disorder with dysplastic features. She has become transfusion-dependent for red blood cells. She is requiring blood every 3-4 weeks. She failed a trial of Aranesp and Procrit. Blood counts as far back as July 2007 showed a macrocytic anemia with hemoglobin 11 g, leukocytosis with white count 13,600, and mild elevation of her platelet count of 399,000. She was referred to our office in April 75 2008. Initial bone marrow done by my partner Dr. Marin Olp showed a hypercellular marrow with ring sideroblasts. Routine cytogenetics were normal. FISH studies not done on that marrow. She tested negative for the JAK-2 mutation. Most recent bone marrow done 01/25/2011 showed normal cytogenetics. No evidence for the 5Q deletion on FISH. This marrow changed little compared with the initial study done at diagnosis in April of 2008. Marrow hypercellular for age, 90%, with prominent erythroid proliferation, megakaryocyte proliferation with the large and abnormal forms. No excess blasts. Normal maturation in the myeloid series. dysplastic changes in the erythroid series.  She developed iron overload from repetitive transfusions.  Ferritin levels were climbing. After much discussion and deliberation, she agreed to a trial of Exjade started in September 2014. Peak ferritin almost 5000 in May 2014 but this correlated with an acute infectious process-staphylococcal aureus bacteremia. A more  reliable baseline obtained 05/13/2013 was 2236 units. Overall she is tolerating the drug well. She was recently changed to the more GI user friendly reformulation of the drug brand-name Jadenu. Ferritin levels have improved significantly with most recent value down significantly to 343 on 12/27/14 . She had an eye exam in March of 2015. Renal function has been stable. Despite this, she elected to stop all medications at time of her last visit with me in September 2016.   Interim History:   She continues to require blood transfusions every 3-4 weeks. I don't transfuse unless hemoglobin is 7 or below and she has increased symptoms. On many occasions, she feels quite well even when hemoglobin is 7 or slightly below. At time of most recent transfusion on January 19, the TV was on in the infusion room and she got upset when she was listening to the [place] [place]. She asked the nurses to turn down the volume or she was going to commit suicide. They took her literally and got quite upset and called me. She has had positive depression in the past. She has elected to stop her antidepressants. I told the nurses to inform her that she needed to resume them. She tells me that this was a false alarm. She is in good spirits. She is not suicidal. She didn't think her antidepressants helped her anyway. She feels that she has had increased bruising. Her primary care doctor referred her to a neurologist for evaluation of distal paresthesias. A B12 level was borderline decreased. She was told that she needed to be on B12 injections. She is concerned with an unintentional weight loss. Baseline weight has been 172-175 pounds through most recent weight taken on 05/26/2015. Weight today is  down 9 pounds at 163.5. When asked about any change in bowel habit, she does think that her bowel habit has changed with more frequent stools but no hematochezia or melena. She has not had a colonoscopy in many years. I  suggested that this might be a good idea. She is seeing an acupuncturist. She is also exercising more despite her chronic anemia. She continues to socialize with her friends and she donates a lot of money to [employer].    Medications: reviewed  Allergies: No Known Allergies  Review of Systems: See history of present illness Remaining ROS negative:   Physical Exam: Blood pressure 145/57, pulse 78, temperature 97.7 F (36.5 C), temperature source Oral, height 5\' 2"  (1.575 m), weight 163 lb 8 oz (74.163 kg), SpO2 98 %. Wt Readings from Last 3 Encounters:  08/22/15 163 lb 8 oz (74.163 kg)  05/26/15 172 lb (78.019 kg)  05/18/15 171 lb (77.565 kg)     General appearance: Well-nourished Caucasian woman HENNT: Mildly icteric sclerae. Pharynx no erythema, exudate, mass, or ulcer. No thyromegaly or thyroid nodules Lymph nodes: No cervical, supraclavicular, or axillary lymphadenopathy Breasts:  Lungs: Clear to auscultation, resonant to percussion throughout.   Heart: Regular rhythm, [id number] systolic murmur left sternal border no gallop, no rub, no click, no edema Abdomen: Soft, nontender, normal bowel sounds, no mass, spleen 12 cm below left costal margin, liver 8 cm below right costal margin. Extremities: No edema, no calf tenderness Musculoskeletal: no joint deformities GU:  Vascular: Carotid pulses 2+, no bruits,  Neurologic: Alert, oriented, PERRLA, optic discs sharp and vessels normal, no hemorrhage or exudate, cranial nerves grossly normal, motor strength 5 over 5, reflexes 1+ symmetric, upper body coordination normal, gait normal, Skin: No rash or ecchymosis despite her reports of increased bruising. Mild jaundice.  Lab Results: CBC W/Diff    Component Value Date/Time   WBC 21.2* 08/18/2015 1116   WBC 6.6 08/23/2014 1153   WBC 16.6* 04/16/2012 1858   RBC 2.80* 08/18/2015 1116   RBC 2.68* 08/23/2014 1153   RBC 2.80* 04/16/2012 1858   HGB 7.9* 08/18/2015 1116   HGB 9.1*  08/23/2014 1153   HGB 8.4* 04/16/2012 1858   HCT 25.2* 08/18/2015 1116   HCT 28.0* 08/23/2014 1153   HCT 27.2* 04/16/2012 1858   PLT 99* 08/18/2015 1116   PLT 226 08/23/2014 1153   MCV 90.1 08/18/2015 1116   MCV 104.5* 08/23/2014 1153   MCV 97.1* 04/16/2012 1858   MCH 28.2 08/18/2015 1116   MCH 34.0 08/23/2014 1153   MCH 30.0 04/16/2012 1858   MCHC 31.3* 08/18/2015 1116   MCHC 32.5 08/23/2014 1153   MCHC 30.9* 04/16/2012 1858   RDW 27.4* 08/18/2015 1116   RDW 20.1* 08/23/2014 1153   LYMPHSABS 2.8 08/18/2015 1116   LYMPHSABS 2.8 08/23/2014 1153   MONOABS 3.2* 08/18/2015 1116   MONOABS 0.2 08/23/2014 1153   EOSABS 0.1 08/18/2015 1116   EOSABS 0.1 08/23/2014 1153   BASOSABS 0.3* 08/18/2015 1116   BASOSABS 0.0 08/23/2014 1153     Chemistry      Component Value Date/Time   NA 139 05/26/2015 0925   NA 140 08/23/2014 1153   K 4.5 05/26/2015 0925   K 4.4 08/23/2014 1153   CL 107 08/23/2014 1153   CL 103 09/01/2012 1453   CO2 23 05/26/2015 0925   CO2 24 08/23/2014 1153   BUN 17.4 05/26/2015 0925   BUN 23 08/23/2014 1153   CREATININE 0.8 05/26/2015 0925  CREATININE 0.81 08/23/2014 1153   CREATININE 1.30* 07/09/2014 1633      Component Value Date/Time   CALCIUM 9.3 05/26/2015 0925   CALCIUM 9.2 08/23/2014 1153   ALKPHOS 81 05/26/2015 0925   ALKPHOS 51 08/23/2014 1153   AST 11 05/26/2015 0925   AST 15 08/23/2014 1153   ALT <9 05/26/2015 0925   ALT 10 08/23/2014 1153   BILITOT 4.43* 05/26/2015 0925   BILITOT 3.6* 08/23/2014 1153       Radiological Studies: No results found.  Impression:  #1. 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  Continue to monitor CBCs on an every three-week basis. Transfuse for hemoglobin 7 g or below. Most recent CBC done on February 2 showed a rise in her white count from 13,000 up to 21,000. Early myeloid cells have been reported on her differential counts. Platelet count is spuriously low since she has a  subpopulation of giant platelets. I am rendered. A CBC today to see if the elevated white count is reproducible. I anticipate that eventually her blood disorder may enter a more aggressive phase. She might be a candidate for ruxolitinib Shanon Brow). This JAK-2 inhibitor reduces  transfusion requirements and may decrease spleen size independent of JAK-2 mutation status (i.e., it works in [place] negative patients.)   #2. 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 See discussion above   #3. 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 She has recently discontinued this medication. I will continue to monitor. Resume when ferritin starts to increase which is inevitable since she continues to require periodic transfusions.  #4. Idiopathic Staphylococcus aureus sepsis. May 2014  She has some dental work planned. I looked up current recommendations for prophylaxis. The recommendation is a weak one with respect to evidence based data but consensus data is to use a single dose of amoxicillin 2 g one hour prior to the procedure. I put in a prescription for her.  #5. Status post Mohs surgery to excise a basal cell carcinoma bridge of nose   #6. Hepatosplenomegaly secondary to her underlying myeloproliferative disorder slowly progressive over time.  #7. Chronic anxiety and depression She denies active depression or suicidal ideation at this time  #8. Chronic, slowly progressive elevation of bilirubin. 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  Time spent with face-to-face contact with this patient: One hour  CC: Patient Care Team: Darcus Austin, MD as PCP - General (Family Medicine) Annia Belt, MD as Consulting Physician (Hematology and Oncology)   Annia Belt, MD 2/6/20175:33 PM

## 2015-08-23 ENCOUNTER — Telehealth: Payer: Self-pay | Admitting: *Deleted

## 2015-08-23 LAB — CBC WITH DIFFERENTIAL/PLATELET
BASOS: 0 %
Basophils Absolute: 0 10*3/uL (ref 0.0–0.2)
EOS (ABSOLUTE): 0 10*3/uL (ref 0.0–0.4)
EOS: 0 %
HEMATOCRIT: 24.2 % — AB (ref 34.0–46.6)
HEMOGLOBIN: 7.7 g/dL — AB (ref 11.1–15.9)
LYMPHS: 14 %
Lymphocytes Absolute: 3.4 10*3/uL — ABNORMAL HIGH (ref 0.7–3.1)
MCH: 28.9 pg (ref 26.6–33.0)
MCHC: 31.8 g/dL (ref 31.5–35.7)
MCV: 91 fL (ref 79–97)
MONOCYTES: 11 %
Monocytes Absolute: 2.7 10*3/uL — ABNORMAL HIGH (ref 0.1–0.9)
NRBC: 1 % — AB (ref 0–0)
Neutrophils Absolute: 14.2 10*3/uL — ABNORMAL HIGH (ref 1.4–7.0)
Neutrophils: 57 %
PLATELETS: 60 10*3/uL — AB (ref 150–379)
RBC: 2.66 x10E6/uL — CL (ref 3.77–5.28)
RDW: 26.7 % — ABNORMAL HIGH (ref 12.3–15.4)
WBC: 24.1 10*3/uL (ref 3.4–10.8)

## 2015-08-23 LAB — COMPREHENSIVE METABOLIC PANEL
ALK PHOS: 137 IU/L — AB (ref 39–117)
ALT: 13 IU/L (ref 0–32)
AST: 15 IU/L (ref 0–40)
Albumin/Globulin Ratio: 2.3 (ref 1.1–2.5)
Albumin: 4.5 g/dL (ref 3.5–4.8)
BUN/Creatinine Ratio: 14 (ref 11–26)
BUN: 14 mg/dL (ref 8–27)
Bilirubin Total: 2.9 mg/dL — ABNORMAL HIGH (ref 0.0–1.2)
CHLORIDE: 100 mmol/L (ref 96–106)
CO2: 21 mmol/L (ref 18–29)
CREATININE: 0.98 mg/dL (ref 0.57–1.00)
Calcium: 8.9 mg/dL (ref 8.7–10.3)
GFR calc Af Amer: 65 mL/min/{1.73_m2} (ref >59)
GFR calc non Af Amer: 56 mL/min/{1.73_m2} — ABNORMAL LOW (ref >59)
GLUCOSE: 96 mg/dL (ref 65–99)
Globulin, Total: 2 g/dL (ref 1.5–4.5)
Potassium: 4.3 mmol/L (ref 3.5–5.2)
Sodium: 140 mmol/L (ref 134–144)
Total Protein: 6.5 g/dL (ref 6.0–8.5)

## 2015-08-23 LAB — IMMATURE CELLS
Bands(Auto) Relative: 2 %
Blasts/blast like cells: 1 % — ABNORMAL HIGH (ref 0–0)
METAMYELOCYTES: 11 % — AB (ref 0–0)
MYELOCYTES: 4 % — AB (ref 0–0)

## 2015-08-23 LAB — RETICULOCYTES: RETIC CT PCT: 6.1 % — AB (ref 0.6–2.6)

## 2015-08-23 LAB — LACTATE DEHYDROGENASE: LDH: 281 IU/L — AB (ref 119–226)

## 2015-08-23 NOTE — Telephone Encounter (Signed)
-----   Message from Annia Belt, MD sent at 08/23/2015  9:04 AM EST ----- Call pt: bilirubin better than her average this time. One other test slightly elevated - could go with liver or bone - will repeat in 3 weeks when she comes in for CBC

## 2015-08-23 NOTE — Telephone Encounter (Signed)
Pt called / informed "bilirubin better than her average this time. One other test slightly elevated - could go with liver or bone - will repeat in 3 weeks when she comes in for CBC" per Dr. Beryle Beams. Informed her alkaline phosphatase is elevated @ 137 ; which is made mostly in the liver and bone; state she will google it.

## 2015-08-24 ENCOUNTER — Other Ambulatory Visit: Payer: Self-pay | Admitting: Oncology

## 2015-08-24 DIAGNOSIS — E559 Vitamin D deficiency, unspecified: Secondary | ICD-10-CM

## 2015-08-24 DIAGNOSIS — R162 Hepatomegaly with splenomegaly, not elsewhere classified: Secondary | ICD-10-CM

## 2015-08-24 DIAGNOSIS — D469 Myelodysplastic syndrome, unspecified: Secondary | ICD-10-CM

## 2015-08-24 DIAGNOSIS — R899 Unspecified abnormal finding in specimens from other organs, systems and tissues: Secondary | ICD-10-CM

## 2015-09-08 ENCOUNTER — Other Ambulatory Visit: Payer: Self-pay | Admitting: Oncology

## 2015-09-08 ENCOUNTER — Ambulatory Visit (HOSPITAL_COMMUNITY)
Admission: RE | Admit: 2015-09-08 | Discharge: 2015-09-08 | Disposition: A | Discharge status: Home / Self Care | Payer: Medicare Other | Source: Ambulatory Visit | Attending: Oncology | Admitting: Oncology

## 2015-09-08 ENCOUNTER — Other Ambulatory Visit: Payer: Medicare Other

## 2015-09-08 ENCOUNTER — Other Ambulatory Visit (HOSPITAL_BASED_OUTPATIENT_CLINIC_OR_DEPARTMENT_OTHER): Payer: Medicare Other

## 2015-09-08 DIAGNOSIS — D469 Myelodysplastic syndrome, unspecified: Secondary | ICD-10-CM

## 2015-09-08 DIAGNOSIS — C946 Myelodysplastic disease, not classified: Secondary | ICD-10-CM | POA: Insufficient documentation

## 2015-09-08 DIAGNOSIS — R899 Unspecified abnormal finding in specimens from other organs, systems and tissues: Secondary | ICD-10-CM

## 2015-09-08 DIAGNOSIS — D649 Anemia, unspecified: Secondary | ICD-10-CM

## 2015-09-08 DIAGNOSIS — D471 Chronic myeloproliferative disease: Secondary | ICD-10-CM | POA: Diagnosis present

## 2015-09-08 DIAGNOSIS — R162 Hepatomegaly with splenomegaly, not elsewhere classified: Secondary | ICD-10-CM

## 2015-09-08 DIAGNOSIS — E559 Vitamin D deficiency, unspecified: Secondary | ICD-10-CM

## 2015-09-08 LAB — CBC WITH DIFFERENTIAL/PLATELET
BASO%: 0.4 % (ref 0.0–2.0)
BASOS ABS: 0.1 10*3/uL (ref 0.0–0.1)
EOS%: 0.5 % (ref 0.0–7.0)
Eosinophils Absolute: 0.1 10*3/uL (ref 0.0–0.5)
HEMATOCRIT: 22.9 % — AB (ref 34.8–46.6)
HGB: 6.9 g/dL — CL (ref 11.6–15.9)
LYMPH#: 2.4 10*3/uL (ref 0.9–3.3)
LYMPH%: 10.6 % — AB (ref 14.0–49.7)
MCH: 26.9 pg (ref 25.1–34.0)
MCHC: 29.9 g/dL — AB (ref 31.5–36.0)
MCV: 90 fL (ref 79.5–101.0)
MONO#: 3.7 10*3/uL — ABNORMAL HIGH (ref 0.1–0.9)
MONO%: 16.1 % — ABNORMAL HIGH (ref 0.0–14.0)
NEUT#: 16.5 10*3/uL — ABNORMAL HIGH (ref 1.5–6.5)
NEUT%: 72.4 % (ref 38.4–76.8)
Platelets: 73 10*3/uL — ABNORMAL LOW (ref 145–400)
RBC: 2.55 10*6/uL — AB (ref 3.70–5.45)
RDW: 31.2 % — ABNORMAL HIGH (ref 11.2–14.5)
WBC: 22.8 10*3/uL — ABNORMAL HIGH (ref 3.9–10.3)

## 2015-09-08 LAB — COMPREHENSIVE METABOLIC PANEL
ALBUMIN: 3.9 g/dL (ref 3.5–5.0)
ALK PHOS: 165 U/L — AB (ref 40–150)
ALT: <9 U/L (ref 0–55)
ANION GAP: 9 meq/L (ref 3–11)
AST: 13 U/L (ref 5–34)
BILIRUBIN TOTAL: 3.1 mg/dL — AB (ref 0.20–1.20)
BUN: 14.4 mg/dL (ref 7.0–26.0)
CALCIUM: 8.7 mg/dL (ref 8.4–10.4)
CO2: 24 mEq/L (ref 22–29)
Chloride: 109 mEq/L (ref 98–109)
Creatinine: 0.9 mg/dL (ref 0.6–1.1)
EGFR: 66 mL/min/{1.73_m2} — AB (ref >90)
Glucose: 102 mg/dl (ref 70–140)
POTASSIUM: 3.4 meq/L — AB (ref 3.5–5.1)
Sodium: 142 mEq/L (ref 136–145)
Total Protein: 6.2 g/dL — ABNORMAL LOW (ref 6.4–8.3)

## 2015-09-08 LAB — TECHNOLOGIST REVIEW: Technologist Review: 3

## 2015-09-08 LAB — URIC ACID: URIC ACID, SERUM: 10 mg/dL — AB (ref 2.6–7.4)

## 2015-09-08 LAB — LACTATE DEHYDROGENASE: LDH: 340 U/L — ABNORMAL HIGH (ref 125–245)

## 2015-09-09 ENCOUNTER — Other Ambulatory Visit: Payer: Self-pay | Admitting: Oncology

## 2015-09-09 ENCOUNTER — Ambulatory Visit (HOSPITAL_BASED_OUTPATIENT_CLINIC_OR_DEPARTMENT_OTHER): Payer: Medicare Other

## 2015-09-09 ENCOUNTER — Other Ambulatory Visit: Payer: Self-pay | Admitting: *Deleted

## 2015-09-09 VITALS — BP 142/60 | HR 69 | Temp 97.2°F | Resp 16

## 2015-09-09 DIAGNOSIS — D479 Neoplasm of uncertain behavior of lymphoid, hematopoietic and related tissue, unspecified: Secondary | ICD-10-CM

## 2015-09-09 DIAGNOSIS — D469 Myelodysplastic syndrome, unspecified: Secondary | ICD-10-CM

## 2015-09-09 DIAGNOSIS — C946 Myelodysplastic disease, not classified: Secondary | ICD-10-CM | POA: Diagnosis not present

## 2015-09-09 LAB — VITAMIN D 25 HYDROXY (VIT D DEFICIENCY, FRACTURES): VIT D 25 HYDROXY: 63.5 ng/mL (ref 30.0–100.0)

## 2015-09-09 LAB — PREPARE RBC (CROSSMATCH)

## 2015-09-09 MED ORDER — SODIUM CHLORIDE 0.9 % IV SOLN
250.0000 mL | Freq: Once | INTRAVENOUS | Status: AC
  Administered 2015-09-09: 250 mL via INTRAVENOUS

## 2015-09-09 MED ORDER — LORAZEPAM 2 MG/ML IJ SOLN
0.5000 mg | Freq: Once | INTRAMUSCULAR | Status: AC
  Administered 2015-09-09: 0.5 mg via INTRAVENOUS

## 2015-09-09 MED ORDER — HEPARIN SOD (PORK) LOCK FLUSH 100 UNIT/ML IV SOLN
500.0000 [IU] | Freq: Every day | INTRAVENOUS | Status: AC | PRN
  Administered 2015-09-09: 500 [IU]
  Filled 2015-09-09: qty 5

## 2015-09-09 MED ORDER — LORAZEPAM 2 MG/ML IJ SOLN
INTRAMUSCULAR | Status: AC
  Filled 2015-09-09: qty 1

## 2015-09-09 MED ORDER — SODIUM CHLORIDE 0.9% FLUSH
10.0000 mL | INTRAVENOUS | Status: AC | PRN
  Administered 2015-09-09: 10 mL
  Filled 2015-09-09: qty 10

## 2015-09-09 MED ORDER — ALLOPURINOL 300 MG PO TABS
300.0000 mg | ORAL_TABLET | Freq: Once | ORAL | Status: AC
  Administered 2015-09-09: 300 mg via ORAL
  Filled 2015-09-09: qty 1

## 2015-09-09 MED ORDER — ALLOPURINOL 300 MG PO TABS: 300.0000 mg | ORAL_TABLET | Freq: Every day | ORAL | Status: AC

## 2015-09-09 NOTE — Patient Instructions (Signed)

## 2015-09-12 ENCOUNTER — Other Ambulatory Visit (HOSPITAL_BASED_OUTPATIENT_CLINIC_OR_DEPARTMENT_OTHER): Payer: Medicare Other

## 2015-09-12 ENCOUNTER — Ambulatory Visit (INDEPENDENT_AMBULATORY_CARE_PROVIDER_SITE_OTHER): Payer: Medicare Other | Admitting: Oncology

## 2015-09-12 ENCOUNTER — Other Ambulatory Visit: Payer: Self-pay | Admitting: Oncology

## 2015-09-12 ENCOUNTER — Other Ambulatory Visit (HOSPITAL_COMMUNITY)
Admission: RE | Admit: 2015-09-12 | Discharge: 2015-09-12 | Disposition: A | Discharge status: Home / Self Care | Payer: Medicare Other | Source: Ambulatory Visit | Attending: Oncology | Admitting: Oncology

## 2015-09-12 VITALS — BP 165/88 | HR 81 | Temp 98.2°F | Resp 20

## 2015-09-12 DIAGNOSIS — D469 Myelodysplastic syndrome, unspecified: Secondary | ICD-10-CM

## 2015-09-12 DIAGNOSIS — D471 Chronic myeloproliferative disease: Secondary | ICD-10-CM | POA: Diagnosis not present

## 2015-09-12 DIAGNOSIS — D696 Thrombocytopenia, unspecified: Secondary | ICD-10-CM | POA: Diagnosis not present

## 2015-09-12 DIAGNOSIS — D479 Neoplasm of uncertain behavior of lymphoid, hematopoietic and related tissue, unspecified: Secondary | ICD-10-CM

## 2015-09-12 LAB — TYPE AND SCREEN
ABO/RH(D): A POS
ANTIBODY SCREEN: NEGATIVE
UNIT DIVISION: 0
Unit division: 0

## 2015-09-12 LAB — CBC WITH DIFFERENTIAL/PLATELET
BASO%: 1.1 % (ref 0.0–2.0)
Basophils Absolute: 0.3 10*3/uL — ABNORMAL HIGH (ref 0.0–0.1)
EOS%: 0.6 % (ref 0.0–7.0)
Eosinophils Absolute: 0.2 10*3/uL (ref 0.0–0.5)
HCT: 27.6 % — ABNORMAL LOW (ref 34.8–46.6)
HEMOGLOBIN: 8.4 g/dL — AB (ref 11.6–15.9)
LYMPH%: 9.9 % — ABNORMAL LOW (ref 14.0–49.7)
MCH: 26.6 pg (ref 25.1–34.0)
MCHC: 30.6 g/dL — ABNORMAL LOW (ref 31.5–36.0)
MCV: 86.9 fL (ref 79.5–101.0)
MONO#: 4.1 10*3/uL — ABNORMAL HIGH (ref 0.1–0.9)
MONO%: 14.2 % — AB (ref 0.0–14.0)
NEUT%: 74.2 % (ref 38.4–76.8)
NEUTROS ABS: 21.5 10*3/uL — AB (ref 1.5–6.5)
Platelets: 75 10*3/uL — ABNORMAL LOW (ref 145–400)
RBC: 3.18 10*6/uL — ABNORMAL LOW (ref 3.70–5.45)
RDW: 28.8 % — AB (ref 11.2–14.5)
WBC: 29 10*3/uL — AB (ref 3.9–10.3)
lymph#: 2.9 10*3/uL (ref 0.9–3.3)

## 2015-09-12 LAB — ALKALINE PHOSPHATASE, ISOENZYMES
Alkaline Phosphatase, S: 157 IU/L — ABNORMAL HIGH (ref 39–117)
BONE FRACTION: 16 % (ref 14–68)
INTESTINAL FRAC.: 0 % (ref 0–18)
LIVER FRACTION: 84 % (ref 18–85)

## 2015-09-12 LAB — TECHNOLOGIST REVIEW

## 2015-09-12 LAB — BONE MARROW EXAM

## 2015-09-12 MED ORDER — LORAZEPAM 1 MG PO TABS
ORAL_TABLET | ORAL | Status: AC
  Filled 2015-09-12: qty 1

## 2015-09-12 MED ORDER — LORAZEPAM 1 MG PO TABS
1.0000 mg | ORAL_TABLET | Freq: Once | ORAL | Status: AC
  Administered 2015-09-12: 1 mg via ORAL

## 2015-09-12 NOTE — Progress Notes (Signed)
Procedure Note: Right posterior iliac crest bone marrow aspiration and biopsy done under topical 1% lidocaine anesthesia without complication. Samples sent for flow cytometry and cytogenetics. 1 mg lorazepam PO pre-med. Red rule procedures followed. Indication: rule out conversion of MDS/MPD to acute leukemia.   Murriel Hopper, MD, [place]  Hematology-Oncology/Internal Medicine

## 2015-09-12 NOTE — Progress Notes (Signed)
Incision dry, intact when initially checked.  After getting dressed incision bled through clothes.  Pressure held and dressing changed.  New dressing remained clean, dry and intact after redressing.

## 2015-09-12 NOTE — Patient Instructions (Signed)
Bone Marrow Aspiration and Bone Marrow Biopsy Bone marrow aspiration and bone marrow biopsy are procedures that are done to diagnose blood disorders. You may also have one of these procedures to help diagnose infections or some types of cancer. Bone marrow is the soft tissue that is inside your bones. Blood cells are produced in bone marrow. For bone marrow aspiration, a sample of tissue in liquid form is removed from inside your bone. For a bone marrow biopsy, a small core of bone marrow tissue is removed. Then these samples are examined under a microscope or tested in a lab. You may need these procedures if you have an abnormal complete blood count (CBC). The aspiration or biopsy sample is usually taken from the top of your hip bone. Sometimes, an aspiration sample is taken from your chest bone (sternum). LET [facility] CARE PROVIDER KNOW ABOUT:  Any allergies you have.  All medicines you are taking, including vitamins, herbs, eye drops, creams, and over-the-counter medicines.  Previous problems you or members of your family have had with the use of anesthetics.  Any blood disorders you have.  Previous surgeries you have had.  Any medical conditions you may have.  Whether you are pregnant or you think that you may be pregnant. RISKS AND COMPLICATIONS Generally, this is a safe procedure. However, problems may occur, including:  Infection.  Bleeding. BEFORE THE PROCEDURE  Ask your health care provider about:  Changing or stopping your regular medicines. This is especially important if you are taking diabetes medicines or blood thinners.  Taking medicines such as aspirin and ibuprofen. These medicines can thin your blood. Do not take these medicines before your procedure if your health care provider instructs you not to.  Plan to have someone take you home after the procedure.  If you go home right after the procedure, plan to have someone with you for 24 hours. PROCEDURE   An  IV tube may be inserted into one of your veins.  The injection site will be cleaned with a germ-killing solution (antiseptic).  You will be given one or more of the following:  A medicine that helps you relax (sedative).  A medicine that numbs the area (local anesthetic).  The bone marrow sample will be removed as follows:  For an aspiration, a hollow needle will be inserted through your skin and into your bone. Bone marrow fluid will be drawn up into a syringe.  For a biopsy, your health care provider will use a hollow needle to remove a core of tissue from your bone marrow.  The needle will be removed.  A bandage (dressing) will be placed over the insertion site and taped in place. The procedure may vary among health care providers and hospitals. AFTER THE PROCEDURE  Your blood pressure, heart rate, breathing rate, and blood oxygen level will be monitored often until the medicines you were given have worn off.  Return to your normal activities as directed by your health care provider.   This information is not intended to replace advice given to you by your health care provider. Make sure you discuss any questions you have with your health care provider.   Document Released: 07/05/2004 Document Revised: 11/16/2014 Document Reviewed: 06/23/2014 Elsevier Interactive Patient Education 2016 [place].  Bone Marrow Aspiration and Bone Marrow Biopsy, Care After Refer to this sheet in the next few weeks. These instructions provide you with information about caring for yourself after your procedure. Your health care provider may also give  you more specific instructions. Your treatment has been planned according to current medical practices, but problems sometimes occur. Call your health care provider if you have any problems or questions after your procedure. WHAT TO EXPECT AFTER THE PROCEDURE After your procedure, it is common to have:  Soreness or tenderness around the puncture  site.  Bruising. HOME CARE INSTRUCTIONS  Take medicines only as directed by your health care provider.  Follow your health care provider's instructions about:  Puncture site care.  Bandage (dressing) changes and removal.  Bathe and shower as directed by your health care provider.  Check your puncture site every day for signs of infection. Watch for:  Redness, swelling, or pain.  Fluid, blood, or pus.  Return to your normal activities as directed by your health care provider.  Keep all follow-up visits as directed by your health care provider. This is important. SEEK MEDICAL CARE IF:  You have a fever.  You have uncontrollable bleeding.  You have redness, swelling, or pain at the site of your puncture.  You have fluid, blood, or pus coming from your puncture site.   This information is not intended to replace advice given to you by your health care provider. Make sure you discuss any questions you have with your health care provider.   Document Released: 01/19/2005 Document Revised: 11/16/2014 Document Reviewed: 06/23/2014 Elsevier Interactive Patient Education 2016 [place] Discharge Instructions for Post Bone Marrow Procedure  Today you had a bone marrow biopsy and aspirate of right hip.   Please keep the pressure dressing in place for at least 24 hours.  Have someone check your dressing periodically for bleeding.  If needed you can reapply a pressure dressing to the site.  Take pain medication as directed.  IF BLEEDING REOCCURS THAT SHOULD BE REPORTED IMMEDIATELY. Call the [facility] at [phone] if during business hours. Or report to the Emergency Room.   I have been informed and understand all the instructions given to me. I know to contact the clinic, my physician, or go to the Emergency Department if any problems should occur. I do not have any questions at this time, but understand that I may call the clinic  during office hours at [phone]  should I have any questions or need assistance in obtaining follow up care.    __________________________________________  _____________  __________ Signature of Patient or Authorized Representative            Date                   Time    __________________________________________ Nurse's Signature

## 2015-09-19 LAB — TISSUE HYBRIDIZATION (BONE MARROW)-NCBH

## 2015-09-19 LAB — CHROMOSOME ANALYSIS, BONE MARROW

## 2015-09-26 ENCOUNTER — Other Ambulatory Visit: Payer: Self-pay | Admitting: Oncology

## 2015-09-26 ENCOUNTER — Encounter: Payer: Self-pay | Admitting: Oncology

## 2015-09-26 ENCOUNTER — Ambulatory Visit (INDEPENDENT_AMBULATORY_CARE_PROVIDER_SITE_OTHER): Payer: Medicare Other | Admitting: Oncology

## 2015-09-26 VITALS — BP 137/58 | HR 83 | Temp 98.2°F | Resp 20 | Ht 62.5 in | Wt 161.1 lb

## 2015-09-26 DIAGNOSIS — D469 Myelodysplastic syndrome, unspecified: Secondary | ICD-10-CM

## 2015-09-26 DIAGNOSIS — R162 Hepatomegaly with splenomegaly, not elsewhere classified: Secondary | ICD-10-CM | POA: Diagnosis not present

## 2015-09-26 DIAGNOSIS — D471 Chronic myeloproliferative disease: Secondary | ICD-10-CM | POA: Diagnosis present

## 2015-09-26 DIAGNOSIS — E790 Hyperuricemia without signs of inflammatory arthritis and tophaceous disease: Secondary | ICD-10-CM

## 2015-09-26 DIAGNOSIS — D72829 Elevated white blood cell count, unspecified: Secondary | ICD-10-CM | POA: Diagnosis not present

## 2015-09-26 DIAGNOSIS — R161 Splenomegaly, not elsewhere classified: Secondary | ICD-10-CM

## 2015-09-26 NOTE — Patient Instructions (Signed)
Keep lab appointment at cancer center for this Thursday CT scan of the abdomen at [facility] this Friday Move up June appointment to 1st available in April

## 2015-09-26 NOTE — Progress Notes (Signed)
Patient ID: Carolyn Brennan, female   DOB: [birth year], 75 y.o.   MRN: [id number] Hematology and Oncology Follow Up Visit  Carolyn Brennan [id number] 02-19-1941 75 y.o. 09/26/2015 5:02 PM   Principle Diagnosis: Encounter Diagnoses  Name Primary?  [name] MDS/MPN (myelodysplastic/myeloproliferative neoplasms) ([place]) Yes  . Splenomegaly   . Hepatosplenomegaly on CT abdomen 11/17/12   . Hyperbilirubinemia      Interim History:  75 year old woman with long-standing myeloproliferative disorder with dysplastic features who up until now has had stable white count and platelet count but transfusion dependent anemia. This changed in the last 6 weeks with a rise in her white count, fall in her platelet count, new onset hyperuricemia and rising LDH. 3% blasts on review of the peripheral blood film. 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 I did a bone marrow aspiration and biopsy on February 27 and she comes in today with her cousin to review results. I had already called her with preliminary results on the aspirate and flow cytometry. There were 5% blasts on the aspirate. Flow cytometry did not show an increased percentage of CD34 positive cells. Light microscopy shows persistent ring sideroblasts. Markedly hypercellular marrow with cellularity not changed from previous. 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 Cytogenetic studies have returned and she is negative for delusions and chromosomes 5 and 7. Negative for the 15; 17 translocation seen with promyelocytic leukemia. Negative for the 8; 21 translocation. I presented her case in our hematology case conference last week.  Medications: reviewed  Allergies: No Known Allergies  Review of Systems: Increased itching of her skin. Increasing fatigue. No bleeding. Increased abdominal girth. Abdominal discomfort related to this.  Physical Exam: Blood pressure 137/58, pulse  83, temperature 98.2 F (36.8 C), temperature source Oral, resp. rate 20, height 5' 2.5" (1.588 m), weight 161 lb 1.6 oz (73.074 kg), SpO2 98 %. Wt Readings from Last 3 Encounters:  09/26/15 161 lb 1.6 oz (73.074 kg)  08/22/15 163 lb 8 oz (74.163 kg)  05/26/15 172 lb (78.019 kg)  She is mildly icteric. Abdomen is distended. Positive fluid wave. Chronic splenomegaly. Complete exam not done today. See 08/22/2015 office note.      Lab Results: CBC W/Diff    Component Value Date/Time   WBC 29.0* 09/12/2015 0919   WBC 24.1* 08/22/2015 1202   WBC 6.6 08/23/2014 1153   WBC 16.6* 04/16/2012 1858   RBC 3.18* 09/12/2015 0919   RBC 2.66* 08/22/2015 1202   RBC 2.68* 08/23/2014 1153   RBC 2.80* 04/16/2012 1858   HGB 8.4* 09/12/2015 0919   HGB 9.1* 08/23/2014 1153   HGB 8.4* 04/16/2012 1858   HCT 27.6* 09/12/2015 0919   HCT 24.2* 08/22/2015 1202   HCT 28.0* 08/23/2014 1153   HCT 27.2* 04/16/2012 1858   PLT 75 Large & giant platelets* 09/12/2015 0919   PLT 60* 08/22/2015 1202   PLT 226 08/23/2014 1153   MCV 86.9 09/12/2015 0919   MCV 91 08/22/2015 1202   MCV 104.5* 08/23/2014 1153   MCV 97.1* 04/16/2012 1858   MCH 26.6 09/12/2015 0919   MCH 28.9 08/22/2015 1202   MCH 34.0 08/23/2014 1153   MCH 30.0 04/16/2012 1858   MCHC 30.6* 09/12/2015 0919   MCHC 31.8 08/22/2015 1202   MCHC 32.5 08/23/2014 1153   MCHC 30.9* 04/16/2012 1858   RDW 28.8* 09/12/2015 0919   RDW 26.7* 08/22/2015 1202   RDW 20.1* 08/23/2014 1153  LYMPHSABS 2.9 09/12/2015 0919   LYMPHSABS 3.4* 08/22/2015 1202   LYMPHSABS 2.8 08/23/2014 1153   MONOABS 4.1* 09/12/2015 0919   MONOABS 0.2 08/23/2014 1153   EOSABS 0.2 09/12/2015 0919   EOSABS 0.0 08/22/2015 1202   EOSABS 0.1 08/23/2014 1153   BASOSABS 0.3* 09/12/2015 0919   BASOSABS 0.0 08/22/2015 1202   BASOSABS 0.0 08/23/2014 1153     Chemistry      Component Value Date/Time   NA 142 09/08/2015 1104   NA 140 08/22/2015 1202   NA 140 08/23/2014 1153   K  3.4* 09/08/2015 1104   K 4.3 08/22/2015 1202   CL 100 08/22/2015 1202   CL 103 09/01/2012 1453   CO2 24 09/08/2015 1104   CO2 21 08/22/2015 1202   BUN 14.4 09/08/2015 1104   BUN 14 08/22/2015 1202   BUN 23 08/23/2014 1153   CREATININE 0.9 09/08/2015 1104   CREATININE 0.98 08/22/2015 1202   CREATININE 0.81 08/23/2014 1153      Component Value Date/Time   CALCIUM 8.7 09/08/2015 1104   CALCIUM 8.9 08/22/2015 1202   ALKPHOS 157* 09/08/2015 1104   ALKPHOS 165* 09/08/2015 1104   ALKPHOS 137* 08/22/2015 1202   AST 13 09/08/2015 1104   AST 15 08/22/2015 1202   ALT <9 09/08/2015 1104   ALT 13 08/22/2015 1202   BILITOT 3.10* 09/08/2015 1104   BILITOT 2.9* 08/22/2015 1202   BILITOT 3.6* 08/23/2014 1153       Radiological Studies: No results found.  Impression:  Evolving changes in hematologic profile but upper limit of normal myeloblasts and normal cytogenetics at this time which do not meet criteria for acute leukemia.  I discussed with the patient and her cousin that I do believe her bone marrow disorder is becoming more aggressive. There is a good likelihood that she will convert to acute leukemia but I cannot predict whether this will happen in weeks or months. We discussed that if acute leukemia occurs, given her age and pre-existing bone marrow disorder, aggressive treatment would result in an 80% mortality rate just from the treatment. Therefore, it is not indicated. We discussed alternative treatments which by and large would be a trial of a hypomethylating agent such as 5-azacytidine which would be given by subcutaneous injection 5 days a month. This would still be associated with significant myelosuppression, need for transfusions, increased risk for infection. Carolyn Brennan is very clear that she does not want any chemotherapy if she converts to acute leukemia. Her main concern was that she would not be in pain. I told her that leukemia does not cause pain but if she did get into  any significant pain I had no problem giving her narcotic analgesics. We discussed what to do for a serious or life-threatening infection with respect to using or withholding antibiotics. She agreed that if she had a life-threatening infection she would not want antibiotic therapy if this was just going to prolong her suffering. I told her that the most common terminal event in advanced leukemia was an infection but in general this did not cause any pain. She would develop a fever which could be treated with antipyretics. Her blood pressure would fall down. She would go to sleep.  With regards to her increasing abdominal symptoms, I am going to go ahead and get a CT scan of her abdomen to re-evaluate her hepatosplenomegaly. I told her I would consider her for a trial of JAKAFI (ruxolitinib), an oral inhibitor of the JAK-2 signaling  pathway which has provided significant symptomatic relief with spleen reduction and in many cases reduction of transfusion requirements in patients with BCR-ABL negative myeloproliferative disorders. The drug appears to be effective even in people who do not have a mutation in the [place]. The drug is given twice a day. It can cause some myelosuppression but overall is very well tolerated. This is something that I feel comfortable giving her and something that she would feel comfortable trying.  100% of this visit was spent with face-to-face contact with the patient for review of data and counseling. Time spent approximately 30 minutes.   CC: Patient Care Team: Darcus Austin, MD as PCP - General (Family Medicine) Annia Belt, MD as Consulting Physician (Hematology and Oncology)   Annia Belt, MD 3/13/20175:02 PM

## 2015-09-28 ENCOUNTER — Encounter (HOSPITAL_COMMUNITY): Payer: Self-pay

## 2015-09-29 ENCOUNTER — Other Ambulatory Visit: Payer: Self-pay | Admitting: Oncology

## 2015-09-29 ENCOUNTER — Other Ambulatory Visit (HOSPITAL_BASED_OUTPATIENT_CLINIC_OR_DEPARTMENT_OTHER): Payer: Medicare Other

## 2015-09-29 DIAGNOSIS — D469 Myelodysplastic syndrome, unspecified: Secondary | ICD-10-CM

## 2015-09-29 DIAGNOSIS — D649 Anemia, unspecified: Secondary | ICD-10-CM

## 2015-09-29 DIAGNOSIS — D479 Neoplasm of uncertain behavior of lymphoid, hematopoietic and related tissue, unspecified: Secondary | ICD-10-CM | POA: Diagnosis present

## 2015-09-29 DIAGNOSIS — R162 Hepatomegaly with splenomegaly, not elsewhere classified: Secondary | ICD-10-CM

## 2015-09-29 DIAGNOSIS — E790 Hyperuricemia without signs of inflammatory arthritis and tophaceous disease: Secondary | ICD-10-CM

## 2015-09-29 LAB — COMPREHENSIVE METABOLIC PANEL
ALT: 16 U/L (ref 0–55)
ANION GAP: 8 meq/L (ref 3–11)
AST: 22 U/L (ref 5–34)
Albumin: 4.1 g/dL (ref 3.5–5.0)
Alkaline Phosphatase: 243 U/L — ABNORMAL HIGH (ref 40–150)
BUN: 15.3 mg/dL (ref 7.0–26.0)
CHLORIDE: 109 meq/L (ref 98–109)
CO2: 25 meq/L (ref 22–29)
Calcium: 9.2 mg/dL (ref 8.4–10.4)
Creatinine: 0.9 mg/dL (ref 0.6–1.1)
EGFR: 66 mL/min/{1.73_m2} — AB (ref >90)
GLUCOSE: 90 mg/dL (ref 70–140)
POTASSIUM: 4.4 meq/L (ref 3.5–5.1)
SODIUM: 142 meq/L (ref 136–145)
TOTAL PROTEIN: 6.5 g/dL (ref 6.4–8.3)
Total Bilirubin: 2.79 mg/dL — ABNORMAL HIGH (ref 0.20–1.20)

## 2015-09-29 LAB — MANUAL DIFFERENTIAL
ALC: 3.5 10*3/uL — ABNORMAL HIGH (ref 0.9–3.3)
ANC (CHCC manual diff): 36.6 10*3/uL — ABNORMAL HIGH (ref 1.5–6.5)
BAND NEUTROPHILS: 4 % (ref 0–10)
BASOPHIL: 4 % — AB (ref 0–2)
BLASTS: 2 % — AB (ref 0–0)
EOS%: 1 % (ref 0–7)
LYMPH: 7 % — ABNORMAL LOW (ref 14–49)
MONO: 7 % (ref 0–14)
MYELOCYTES: 5 % — AB (ref 0–0)
Metamyelocytes: 3 % — ABNORMAL HIGH (ref 0–0)
NRBC: 1 % — AB (ref 0–0)
OTHER CELL: 0 % (ref 0–0)
PLT EST: DECREASED
PROMYELO: 6 % — AB (ref 0–0)
SEG: 61 % (ref 38–77)
Variant Lymph: 0 % (ref 0–0)

## 2015-09-29 LAB — CBC WITH DIFFERENTIAL/PLATELET
HCT: 27.7 % — ABNORMAL LOW (ref 34.8–46.6)
HEMOGLOBIN: 8.3 g/dL — AB (ref 11.6–15.9)
MCH: 26.5 pg (ref 25.1–34.0)
MCHC: 30 g/dL — AB (ref 31.5–36.0)
MCV: 88.4 fL (ref 79.5–101.0)
Platelets: 105 10*3/uL — ABNORMAL LOW (ref 145–400)
RBC: 3.14 10*6/uL — AB (ref 3.70–5.45)
RDW: 31.4 % — AB (ref 11.2–14.5)
WBC: 50.2 10*3/uL (ref 3.9–10.3)

## 2015-09-29 LAB — URIC ACID: Uric Acid, Serum: 6.7 mg/dl (ref 2.6–7.4)

## 2015-09-29 LAB — LACTATE DEHYDROGENASE: LDH: 483 U/L — AB (ref 125–245)

## 2015-10-03 ENCOUNTER — Other Ambulatory Visit: Payer: Self-pay | Admitting: Oncology

## 2015-10-03 ENCOUNTER — Encounter: Payer: Self-pay | Admitting: Oncology

## 2015-10-03 DIAGNOSIS — C946 Myelodysplastic disease, not classified: Secondary | ICD-10-CM

## 2015-10-03 DIAGNOSIS — D47Z9 Other specified neoplasms of uncertain behavior of lymphoid, hematopoietic and related tissue: Secondary | ICD-10-CM

## 2015-10-03 MED ORDER — ONDANSETRON 4 MG PO TBDP: ORAL_TABLET | ORAL | Status: AC

## 2015-10-03 MED ORDER — HYDROXYUREA 500 MG PO CAPS: 1000.0000 mg | ORAL_CAPSULE | Freq: Every day | ORAL | Status: AC

## 2015-10-03 NOTE — Progress Notes (Unsigned)
Patient ID: Carolyn Brennan, female   DOB: 11-Aug-1940, 75 y.o.   MRN: [id number] The patient's blood disorder is entering a more aggressive phase. There is been another doubling of her white count over the last week now up to 50,000. Platelet count has remained stable in the interval. Uric acid now in safe range on allopurinol started a week ago. No increase in peripheral blood blasts. Recent bone marrow shows that bone marrow disorder has not crossed the border to acute leukemia yet but rapid rise in white count, uric acid, fall in platelets, and rising LDH portend trouble. Performance status has deteriorated as well with marked increase in fatigue independent of her chronic anemia. I'm going to start her on Hydrea 1000 mg daily at this time. I called to discuss this with her. There should be minimal side effects. There may be some initial nausea until her body gets used to the drug. No or minimal hair loss. She is scheduled for a CT scan this Wednesday, March 22 as a baseline evaluation of her spleen and liver size. Prognosis is guarded. We had a lengthy discussion at time of her visit with me on March 13. She does not want aggressive life extending measures to be employed. She wants to be kept comfortable. I will honor her request.

## 2015-10-05 ENCOUNTER — Telehealth: Payer: Self-pay | Admitting: *Deleted

## 2015-10-05 ENCOUNTER — Ambulatory Visit (HOSPITAL_COMMUNITY)
Admission: RE | Admit: 2015-10-05 | Discharge: 2015-10-05 | Disposition: A | Discharge status: Home / Self Care | Payer: Medicare Other | Source: Ambulatory Visit | Attending: Oncology | Admitting: Oncology

## 2015-10-05 ENCOUNTER — Encounter (HOSPITAL_COMMUNITY): Payer: Self-pay

## 2015-10-05 DIAGNOSIS — D479 Neoplasm of uncertain behavior of lymphoid, hematopoietic and related tissue, unspecified: Secondary | ICD-10-CM | POA: Diagnosis not present

## 2015-10-05 DIAGNOSIS — R161 Splenomegaly, not elsewhere classified: Secondary | ICD-10-CM | POA: Insufficient documentation

## 2015-10-05 DIAGNOSIS — R162 Hepatomegaly with splenomegaly, not elsewhere classified: Secondary | ICD-10-CM | POA: Diagnosis not present

## 2015-10-05 DIAGNOSIS — D469 Myelodysplastic syndrome, unspecified: Secondary | ICD-10-CM

## 2015-10-05 MED ORDER — IOPAMIDOL (ISOVUE-300) INJECTION 61%
100.0000 mL | Freq: Once | INTRAVENOUS | Status: AC | PRN
  Administered 2015-10-05: 100 mL via INTRAVENOUS

## 2015-10-05 NOTE — Telephone Encounter (Signed)
Call from radiology - report of CT Abd, which is in EPIC - called Dr Beryle Beams, stated he has already seen report.

## 2015-10-06 ENCOUNTER — Other Ambulatory Visit: Payer: Medicare Other

## 2015-10-08 ENCOUNTER — Emergency Department (HOSPITAL_COMMUNITY): Payer: Medicare Other

## 2015-10-08 ENCOUNTER — Emergency Department (HOSPITAL_COMMUNITY)
Admission: EM | Admit: 2015-10-08 | Discharge: 2015-10-08 | Disposition: A | Discharge status: Home / Self Care | Payer: Medicare Other | Attending: Emergency Medicine | Admitting: Emergency Medicine

## 2015-10-08 ENCOUNTER — Encounter (HOSPITAL_COMMUNITY): Payer: Self-pay | Admitting: Emergency Medicine

## 2015-10-08 DIAGNOSIS — Z87891 Personal history of nicotine dependence: Secondary | ICD-10-CM | POA: Diagnosis not present

## 2015-10-08 DIAGNOSIS — Z79899 Other long term (current) drug therapy: Secondary | ICD-10-CM | POA: Insufficient documentation

## 2015-10-08 DIAGNOSIS — M791 Myalgia: Secondary | ICD-10-CM | POA: Diagnosis not present

## 2015-10-08 DIAGNOSIS — F419 Anxiety disorder, unspecified: Secondary | ICD-10-CM | POA: Diagnosis not present

## 2015-10-08 DIAGNOSIS — Z792 Long term (current) use of antibiotics: Secondary | ICD-10-CM | POA: Diagnosis not present

## 2015-10-08 DIAGNOSIS — M25512 Pain in left shoulder: Secondary | ICD-10-CM | POA: Diagnosis present

## 2015-10-08 DIAGNOSIS — R0789 Other chest pain: Secondary | ICD-10-CM

## 2015-10-08 DIAGNOSIS — D6189 Other specified aplastic anemias and other bone marrow failure syndromes: Secondary | ICD-10-CM | POA: Diagnosis not present

## 2015-10-08 LAB — CBC WITH DIFFERENTIAL/PLATELET
BAND NEUTROPHILS: 2 %
BASOS ABS: 0 10*3/uL (ref 0.0–0.1)
BASOS PCT: 0 %
Blasts: 0 %
EOS ABS: 0.3 10*3/uL (ref 0.0–0.7)
Eosinophils Relative: 1 %
HCT: 23 % — ABNORMAL LOW (ref 36.0–46.0)
Hemoglobin: 7.1 g/dL — ABNORMAL LOW (ref 12.0–15.0)
Lymphocytes Relative: 15 %
Lymphs Abs: 4.8 10*3/uL — ABNORMAL HIGH (ref 0.7–4.0)
MCH: 27.1 pg (ref 26.0–34.0)
MCHC: 30.9 g/dL (ref 30.0–36.0)
MCV: 87.8 fL (ref 78.0–100.0)
MONO ABS: 2.9 10*3/uL — AB (ref 0.1–1.0)
MYELOCYTES: 5 %
Metamyelocytes Relative: 8 %
Monocytes Relative: 9 %
NEUTROS PCT: 58 %
Neutro Abs: 24.3 10*3/uL — ABNORMAL HIGH (ref 1.7–7.7)
Other: 0 %
PLATELETS: 105 10*3/uL — AB (ref 150–400)
PROMYELOCYTES ABS: 2 %
RBC: 2.62 MIL/uL — ABNORMAL LOW (ref 3.87–5.11)
RDW: 28.6 % — AB (ref 11.5–15.5)
WBC: 32.3 10*3/uL — ABNORMAL HIGH (ref 4.0–10.5)
nRBC: 0 /100 WBC

## 2015-10-08 LAB — BASIC METABOLIC PANEL
Anion gap: 7 (ref 5–15)
BUN: 19 mg/dL (ref 6–20)
CALCIUM: 8.9 mg/dL (ref 8.9–10.3)
CO2: 25 mmol/L (ref 22–32)
Chloride: 109 mmol/L (ref 101–111)
Creatinine, Ser: 0.73 mg/dL (ref 0.44–1.00)
GFR calc Af Amer: >60 mL/min (ref >60)
GLUCOSE: 106 mg/dL — AB (ref 65–99)
Potassium: 4.4 mmol/L (ref 3.5–5.1)
Sodium: 141 mmol/L (ref 135–145)

## 2015-10-08 LAB — I-STAT TROPONIN, ED: Troponin i, poc: 0.01 ng/mL (ref 0.00–0.08)

## 2015-10-08 MED ORDER — ACETAMINOPHEN 500 MG PO TABS
1000.0000 mg | ORAL_TABLET | Freq: Once | ORAL | Status: AC
  Administered 2015-10-08: 1000 mg via ORAL
  Filled 2015-10-08: qty 2

## 2015-10-08 NOTE — Discharge Instructions (Signed)

## 2015-10-08 NOTE — ED Notes (Signed)
Per EMS, patient is from home.  Patient is complaining of left shoulder pain since this morning.  Pain radiates all down shoulder to upper back.  Patient thinks she slept on it wrong.  Patient denies popping, grinding, or clicking.    BP:134/82 HR: 82 O2: 94% on room air

## 2015-10-08 NOTE — ED Notes (Signed)
Bed: [id number] Expected date:  Expected time:  Means of arrival:  Comments: EMS- 75 yo, L shoulder pain

## 2015-10-08 NOTE — ED Notes (Signed)
Patient transported to X-ray 

## 2015-10-08 NOTE — ED Provider Notes (Signed)
CSN: [id number]     Arrival date & time 10/08/15  [id number] History   First MD Initiated Contact with Patient 10/08/15 0900     Chief Complaint  Patient presents with  . Shoulder Pain     (Consider location/radiation/quality/duration/timing/severity/associated sxs/prior Treatment) Patient is a 75 y.o. female presenting with shoulder pain. The history is provided by the patient.  Shoulder Pain Location:  Clavicle Time since incident:  6 hours Injury: no   Clavicle location:  L clavicle Pain details:    Quality:  Sharp   Radiates to:  L arm   Severity:  Moderate   Onset quality:  Sudden   Duration:  6 hours   Timing:  Intermittent   Progression:  Waxing and waning Chronicity:  New Prior injury to area:  No Relieved by:  Nothing Worsened by:  Bearing weight, movement and stress Ineffective treatments:  None tried Associated symptoms: no fever    75 yo F With a chief complaint of left shoulder pain. This woke her from sleep. Is worse with lying on it or moving the arm. Denies injury. Denies cough congestion fevers. 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 Patient attributes the symptoms to this. Called her physical therapist to told her to come here because this may be cardiac in nature. Patient denies any lower extremity edema denies any recent surgeries. Patient denies history of PE or DVT. Patient denies any cardiac risk factors.  Past Medical History  Diagnosis Date  . MDS/MPN (myelodysplastic/myeloproliferative neoplasms) ([place]) 08/12/2011  . Anxiety and depression 08/12/2011  . Anemia   . Iron overload due to repeated red blood cell transfusions 07/21/2013  . Iron overload due to repeated red blood cell transfusions 08/23/2014   Past Surgical History  Procedure Laterality Date  . Neck plastic surgery    . Abdominoplasty/panniculectomy    . Tubal ligation    . Breast reduction surgery    . Appendectomy    . Tee without cardioversion N/A 11/19/2012   Procedure: TRANSESOPHAGEAL ECHOCARDIOGRAM (TEE);  Surgeon: Jettie Booze, MD;  Location: [facility] ENDOSCOPY;  Service: Cardiovascular;  Laterality: N/A;   Family History  Problem Relation Age of Onset  . Breast cancer Mother   . Colon cancer Father 77   Social History  Substance Use Topics  . Smoking status: Former Smoker    Start date: 04/16/1992    Quit date: 12/16/2006  . Smokeless tobacco: Never Used  . Alcohol Use: No   OB History    Gravida Para Term Preterm AB TAB SAB Ectopic Multiple Living   3 0        0     Review of Systems  Constitutional: Negative for fever and chills.  HENT: Negative for congestion and rhinorrhea.   Eyes: Negative for redness and visual disturbance.  Respiratory: Negative for shortness of breath and wheezing.   Cardiovascular: Positive for chest pain. Negative for palpitations.  Gastrointestinal: Negative for nausea and vomiting.  Genitourinary: Negative for dysuria and urgency.  Musculoskeletal: Positive for myalgias and arthralgias.  Skin: Negative for pallor and wound.  Neurological: Negative for dizziness and headaches.      Allergies  Review of patient's allergies indicates no known allergies.  Home Medications   Prior to Admission medications   Medication Sig Start Date End Date Taking? Authorizing Provider  allopurinol (ZYLOPRIM) 300 MG tablet Take 1 tablet (300 mg total) by mouth daily. 09/09/15 09/08/16  Annia Belt, MD  ampicillin (PRINCIPEN) 250 MG capsule Take  8 capsules (2,000 mg total) by mouth once. 4 capsules 1 hour before dental work 08/22/15   Annia Belt, MD  ergocalciferol (VITAMIN D2) 50000 UNITS capsule Take 50,000 Units by mouth 2 (two) times a week. On mondays and fridays    Historical Provider, MD  hydroxyurea (HYDREA) 500 MG capsule Take 2 capsules (1,000 mg total) by mouth daily. May take with food to minimize GI side effects. 10/03/15   Annia Belt, MD  levofloxacin (LEVAQUIN) 500 MG tablet  Take 1 tablet (500 mg total) by mouth daily. 05/26/15   Susanne Borders, NP  lidocaine-prilocaine (EMLA) cream Apply topically as needed. Apply over port area 1-2 hours before treatment and cover with plastic wrap Patient not taking: Reported on 05/18/2015 05/03/14   Annia Belt, MD  LORazepam (ATIVAN) 0.5 MG tablet Take 1 tablet (0.5 mg total) by mouth daily as needed for anxiety or sleep. Or nausea. 05/03/14   Annia Belt, MD  ondansetron (ZOFRAN ODT) 4 MG disintegrating tablet Dissolve 1 tablet under tongue 30 minutes before taking Hydrea then use every 8 hours as needed. 10/03/15   Annia Belt, MD   BP 129/72 mmHg  Pulse 86  Temp(Src) 98.2 F (36.8 C) (Oral)  Resp 16  SpO2 94% Physical Exam  Constitutional: She is oriented to person, place, and time. She appears well-developed and well-nourished. No distress.  HENT:  Head: Normocephalic and atraumatic.  Eyes: EOM are normal. Pupils are equal, round, and reactive to light.  Neck: Normal range of motion. Neck supple.  Cardiovascular: Normal rate and regular rhythm.  Exam reveals no gallop and no friction rub.   No murmur heard. Pulmonary/Chest: Effort normal. She has no wheezes. She has no rales. She exhibits tenderness (significant tenderness palpation about the space between the left clavicle and the first rib.).  Abdominal: Soft. She exhibits no distension. There is no tenderness. There is no rebound and no guarding.  Musculoskeletal: She exhibits no edema or tenderness.  Neurological: She is alert and oriented to person, place, and time.  Skin: Skin is warm and dry. She is not diaphoretic.  Psychiatric: She has a normal mood and affect. Her behavior is normal.  Nursing note and vitals reviewed.   ED Course  Procedures (including critical care time) Labs Review Labs Reviewed  CBC WITH DIFFERENTIAL/PLATELET - Abnormal; Notable for the following:    WBC 32.3 (*)    RBC 2.62 (*)    Hemoglobin 7.1 (*)     HCT 23.0 (*)    RDW 28.6 (*)    Platelets 105 (*)    Neutro Abs 24.3 (*)    Lymphs Abs 4.8 (*)    Monocytes Absolute 2.9 (*)    All other components within normal limits  BASIC METABOLIC PANEL - Abnormal; Notable for the following:    Glucose, Bld 106 (*)    All other components within normal limits  I-STAT TROPOININ, ED    Imaging Review Dg Chest 2 View  10/08/2015  CLINICAL DATA:  Left shoulder and arm pain for 2-3 days. No known injury. History of myeloproliferative neoplasm. EXAM: CHEST  2 VIEW COMPARISON:  PA and lateral chest 05/26/2015 and 10/14/2013. FINDINGS: Right IJ approach Port-A-Cath remains in place. There is cardiomegaly without edema. Minimal subsegmental atelectasis is present in the right lung base. No pneumothorax or pleural effusion. No focal bony abnormality. IMPRESSION: Cardiomegaly without acute disease. Electronically Signed   By: Inge Rise M.D.   On: 10/08/2015 10:33  I have personally reviewed and evaluated these images and lab results as part of my medical decision-making.   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 Pain is localized to the musculature surrounding the left clavicle. Pinpoint tenderness. Troponin negative. EKG unremarkable. Chest x-ray unremarkable. Hbg at 7.1, discussed transfusion vs d/c, electing discharge home.  2:54 PM:  I have discussed the diagnosis/risks/treatment options with the patient and believe the pt to be eligible for discharge home to  follow-up with PCP. We also discussed returning to the ED immediately if new or worsening sx occur. We discussed the sx which are most concerning (e.g., sudden worsening pain, fever, inability to tolerate by mouth) that necessitate immediate return. Medications administered to the patient during their visit and any new prescriptions provided to the patient are listed below.  Medications given during this visit Medications  acetaminophen (TYLENOL) tablet 1,000 mg (1,000 mg Oral Given 10/08/15 0946)    Discharge Medication List as of 10/08/2015 11:06 AM      The patient appears reasonably screen and/or stabilized for discharge and I doubt any other medical condition or other [facility] requiring further screening, evaluation, or treatment in the ED at this time prior to discharge.    Deno Etienne, DO 10/08/15 1454

## 2015-10-12 ENCOUNTER — Other Ambulatory Visit: Payer: Self-pay | Admitting: Oncology

## 2015-10-12 ENCOUNTER — Telehealth: Payer: Self-pay | Admitting: *Deleted

## 2015-10-12 DIAGNOSIS — D469 Myelodysplastic syndrome, unspecified: Secondary | ICD-10-CM

## 2015-10-12 NOTE — Telephone Encounter (Signed)
Pt had left message - [facility]; wants to know what time. Talked to Dr Beryle Beams - pt needs to go to today if possible; [facility] is aware she's coming. Called pt back - stated she's tired(already had one appt today) and she will go tomorrow for CBC unless something happens.

## 2015-10-12 NOTE — Telephone Encounter (Signed)
Noted  

## 2015-10-13 ENCOUNTER — Ambulatory Visit (HOSPITAL_BASED_OUTPATIENT_CLINIC_OR_DEPARTMENT_OTHER): Payer: Medicare Other

## 2015-10-13 ENCOUNTER — Telehealth: Payer: Self-pay | Admitting: *Deleted

## 2015-10-13 DIAGNOSIS — D479 Neoplasm of uncertain behavior of lymphoid, hematopoietic and related tissue, unspecified: Secondary | ICD-10-CM | POA: Diagnosis not present

## 2015-10-13 DIAGNOSIS — D469 Myelodysplastic syndrome, unspecified: Secondary | ICD-10-CM

## 2015-10-13 LAB — CBC WITH DIFFERENTIAL/PLATELET
HEMATOCRIT: 23 % — AB (ref 34.8–46.6)
HGB: 7 g/dL — ABNORMAL LOW (ref 11.6–15.9)
MCH: 26.5 pg (ref 25.1–34.0)
MCHC: 30.2 g/dL — AB (ref 31.5–36.0)
MCV: 87.7 fL (ref 79.5–101.0)
PLATELETS: 95 10*3/uL — AB (ref 145–400)
RBC: 2.62 10*6/uL — ABNORMAL LOW (ref 3.70–5.45)
RDW: 31.1 % — ABNORMAL HIGH (ref 11.2–14.5)
WBC: 37.4 10*3/uL — AB (ref 3.9–10.3)

## 2015-10-13 LAB — MANUAL DIFFERENTIAL
ALC: 4.1 10*3/uL — ABNORMAL HIGH (ref 0.9–3.3)
ANC (CHCC manual diff): 27.7 10*3/uL — ABNORMAL HIGH (ref 1.5–6.5)
BAND NEUTROPHILS: 12 % — AB (ref 0–10)
Basophil: 2 % (ref 0–2)
Blasts: 4 % — ABNORMAL HIGH (ref 0–0)
EOS%: 0 % (ref 0–7)
LYMPH: 11 % — AB (ref 14–49)
MONO: 9 % (ref 0–14)
MYELOCYTES: 2 % — AB (ref 0–0)
Metamyelocytes: 5 % — ABNORMAL HIGH (ref 0–0)
NRBC: 2 % — AB (ref 0–0)
OTHER CELL: 0 % (ref 0–0)
PLT EST: DECREASED
PROMYELO: 0 % (ref 0–0)
SEG: 55 % (ref 38–77)
VARIANT LYMPH: 0 % (ref 0–0)

## 2015-10-13 NOTE — Telephone Encounter (Signed)
Pt called / informed of results which she was already awared.; informed to stay on current dose of Hydrea per Dr Beryle Beams. Pt wanted to know if she keep lab appt scheduled next week; talked to Dr Beryle Beams -stated yes; pt informed; voiced understanding.

## 2015-10-13 NOTE — Telephone Encounter (Signed)
-----   Message from Annia Belt, MD sent at 10/13/2015 11:54 AM EDT ----- Call pt: white count & platelets OK. Stay on current dose of Hydrea. Hemoglobin 7, which I think she knows already

## 2015-10-17 ENCOUNTER — Encounter: Payer: Self-pay | Admitting: Oncology

## 2015-10-17 NOTE — Progress Notes (Signed)
Patient ID: Carolyn Brennan, female   DOB: March 14, 1941, 75 y.o.   MRN: [id number]  I have had a number of email communications with the patient over the last few days. She is getting tired. She is spending most of the day in bed. Still having GI symptoms but less over the last few days. She is due to see gastroenterology this week. She is now strongly considering stopping transfusions and Hydrea. She has made her peace. She has communicated with her rabbi. She is interested in hospice care. I updated her primary care physician Dr. Darcus Austin by phone. She will make a hospice referral and act as hospice attending. I will remain involved for support.

## 2015-10-20 ENCOUNTER — Other Ambulatory Visit: Payer: Medicare Other

## 2015-10-29 ENCOUNTER — Encounter (HOSPITAL_COMMUNITY): Payer: Self-pay | Admitting: Emergency Medicine

## 2015-10-29 ENCOUNTER — Emergency Department (HOSPITAL_COMMUNITY)
Admission: EM | Admit: 2015-10-29 | Discharge: 2015-10-29 | Discharge status: Against Medical Advice | Attending: Emergency Medicine | Admitting: Emergency Medicine

## 2015-10-29 DIAGNOSIS — F329 Major depressive disorder, single episode, unspecified: Secondary | ICD-10-CM | POA: Diagnosis not present

## 2015-10-29 DIAGNOSIS — Z87891 Personal history of nicotine dependence: Secondary | ICD-10-CM | POA: Diagnosis not present

## 2015-10-29 DIAGNOSIS — F419 Anxiety disorder, unspecified: Secondary | ICD-10-CM | POA: Insufficient documentation

## 2015-10-29 DIAGNOSIS — Z8639 Personal history of other endocrine, nutritional and metabolic disease: Secondary | ICD-10-CM | POA: Diagnosis not present

## 2015-10-29 DIAGNOSIS — Z86018 Personal history of other benign neoplasm: Secondary | ICD-10-CM | POA: Diagnosis not present

## 2015-10-29 DIAGNOSIS — D649 Anemia, unspecified: Secondary | ICD-10-CM | POA: Diagnosis not present

## 2015-10-29 DIAGNOSIS — R040 Epistaxis: Secondary | ICD-10-CM | POA: Diagnosis present

## 2015-10-29 LAB — BASIC METABOLIC PANEL
Anion gap: 7 (ref 5–15)
BUN: 23 mg/dL — ABNORMAL HIGH (ref 6–20)
CO2: 21 mmol/L — ABNORMAL LOW (ref 22–32)
Calcium: 8.2 mg/dL — ABNORMAL LOW (ref 8.9–10.3)
Chloride: 113 mmol/L — ABNORMAL HIGH (ref 101–111)
Creatinine, Ser: 0.75 mg/dL (ref 0.44–1.00)
GFR calc Af Amer: >60 mL/min (ref >60)
GFR calc non Af Amer: >60 mL/min (ref >60)
Glucose, Bld: 91 mg/dL (ref 65–99)
Potassium: 3.8 mmol/L (ref 3.5–5.1)
Sodium: 141 mmol/L (ref 135–145)

## 2015-10-29 LAB — CBC
HCT: 19.6 % — ABNORMAL LOW (ref 36.0–46.0)
Hemoglobin: 6 g/dL — CL (ref 12.0–15.0)
MCH: 28.2 pg (ref 26.0–34.0)
MCHC: 30.6 g/dL (ref 30.0–36.0)
MCV: 92 fL (ref 78.0–100.0)
Platelets: 92 10*3/uL — ABNORMAL LOW (ref 150–400)
RBC: 2.13 MIL/uL — ABNORMAL LOW (ref 3.87–5.11)
RDW: 31.9 % — ABNORMAL HIGH (ref 11.5–15.5)
WBC: 35.3 10*3/uL — ABNORMAL HIGH (ref 4.0–10.5)

## 2015-10-29 NOTE — Discharge Instructions (Signed)

## 2015-10-29 NOTE — ED Notes (Signed)
Pt has not had a nose bleed since arrival. Pt wishes to go home.

## 2015-10-29 NOTE — ED Notes (Signed)
Bed: [id number] Expected date: 10/29/15 Expected time: 1:13 PM Means of arrival:  Comments: Nose bleed

## 2015-10-29 NOTE — ED Notes (Signed)
Pt arrived via EMS with report of rt nare nose bleed x6 hrs after blowing. Pt denies taking blood thinner but report [id number]. Pt was given Afrin with some relief. Pt does not have active bleeding noted.

## 2015-10-29 NOTE — ED Provider Notes (Signed)
CSN: [id number]     Arrival date & time 10/29/15  1312 History   First MD Initiated Contact with Patient 10/29/15 1437     Chief Complaint  Patient presents with  . 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 Patient reports that upon waking up this morning she blew her nose and had no episode of bleeding. Patient reports this is not abnormal for her as she's had nasal surgery prior. She notes that bleeding continued longer than anticipated duration. She notes this lasted for several hours, she called EMS at that time. Patient is a hospice patient, she has progressive hematological disease, currently being taken care of by Dr. Beryle Beams. She reports she no him, he requested that she be seen in the emergency room with a CBC performed. At this time my evaluation patient reports she is no longer bleeding. She reports that she will not accept blood transfusions as she is on hospice care and her quality of life is poor to begin with. No other bleeding noted. She reports she feels weak, denies any chest pain, shortness of breath, abdominal pain.    Past Medical History  Diagnosis Date  . MDS/MPN (myelodysplastic/myeloproliferative neoplasms) ([place]) 08/12/2011  . Anxiety and depression 08/12/2011  . Anemia   . Iron overload due to repeated red blood cell transfusions 07/21/2013  . Iron overload due to repeated red blood cell transfusions 08/23/2014   Past Surgical History  Procedure Laterality Date  . Neck plastic surgery    . Abdominoplasty/panniculectomy    . Tubal ligation    . Breast reduction surgery    . Appendectomy    . Tee without cardioversion N/A 11/19/2012    Procedure: TRANSESOPHAGEAL ECHOCARDIOGRAM (TEE);  Surgeon: Jettie Booze, MD;  Location: [facility] ENDOSCOPY;  Service: Cardiovascular;  Laterality: N/A;   Family History  Problem Relation Age of Onset  . Breast cancer Mother    . Colon cancer Father 43   Social History  Substance Use Topics  . Smoking status: Former Smoker    Start date: 04/16/1992    Quit date: 12/16/2006  . Smokeless tobacco: Never Used  . Alcohol Use: No   OB History    Gravida Para Term Preterm AB TAB SAB Ectopic Multiple Living   3 0        0     Review of Systems  All other systems reviewed and are negative.   Allergies  Review of patient's allergies indicates no known allergies.  Home Medications   Prior to Admission medications   Medication Sig Start Date End Date Taking? Authorizing Provider  ampicillin (PRINCIPEN) 250 MG capsule Take 8 capsules (2,000 mg total) by mouth once. 4 capsules 1 hour before dental work 08/22/15  Yes Annia Belt, MD  ibuprofen (ADVIL,MOTRIN) 200 MG tablet Take 400 mg by mouth every 6 (six) hours as needed for moderate pain.   Yes Historical Provider, MD  LORazepam (ATIVAN) 0.5 MG tablet Take 1 tablet (0.5 mg total) by mouth daily as needed for anxiety or sleep. Or nausea. 05/03/14  Yes Annia Belt, MD  allopurinol (ZYLOPRIM) 300 MG tablet Take 1 tablet (300 mg total) by mouth daily. Patient not taking: Reported on 10/29/2015 09/09/15 09/08/16  Annia Belt, MD  hydroxyurea (HYDREA) 500 MG capsule Take 2 capsules (1,000 mg total) by mouth daily. May take with food to minimize GI side effects. Patient not taking: Reported  on 10/29/2015 10/03/15   Annia Belt, MD  levofloxacin (LEVAQUIN) 500 MG tablet Take 1 tablet (500 mg total) by mouth daily. Patient not taking: Reported on 10/29/2015 05/26/15   Susanne Borders, NP  lidocaine-prilocaine (EMLA) cream Apply topically as needed. Apply over port area 1-2 hours before treatment and cover with plastic wrap Patient not taking: Reported on 05/18/2015 05/03/14   Annia Belt, MD  ondansetron (ZOFRAN ODT) 4 MG disintegrating tablet Dissolve 1 tablet under tongue 30 minutes before taking Hydrea then use every 8 hours as  needed. Patient not taking: Reported on 10/29/2015 10/03/15   Annia Belt, MD   BP 114/60 mmHg  Pulse 84  Temp(Src) 98.5 F (36.9 C) (Oral)  Resp 18  SpO2 96%   Physical Exam  Constitutional: She is oriented to person, place, and time. She appears well-developed and well-nourished.  Pale appearing  HENT:  Head: Normocephalic and atraumatic.  Dried blood bilateral nares, no signs of active bleeding  Eyes: Conjunctivae are normal. Pupils are equal, round, and reactive to light. Right eye exhibits no discharge. Left eye exhibits no discharge. No scleral icterus.  Neck: Normal range of motion. No JVD present. No tracheal deviation present.  Cardiovascular: Normal rate, regular rhythm and normal heart sounds.   Pulmonary/Chest: Effort normal and breath sounds normal. No stridor. No respiratory distress. She has no wheezes. She has no rales. She exhibits no tenderness.  Abdominal: Soft.  Neurological: She is alert and oriented to person, place, and time. Coordination normal.  Skin: Skin is warm and dry. No rash noted. No erythema.  Psychiatric: She has a normal mood and affect. Her behavior is normal. Judgment and thought content normal.  Nursing note and vitals reviewed.   ED Course  Procedures (including critical care time) Labs Review Labs Reviewed  CBC - Abnormal; Notable for the following:    WBC 35.3 (*)    RBC 2.13 (*)    Hemoglobin 6.0 (*)    HCT 19.6 (*)    RDW 31.9 (*)    Platelets 92 (*)    All other components within normal limits  BASIC METABOLIC PANEL - Abnormal; Notable for the following:    Chloride 113 (*)    CO2 21 (*)    BUN 23 (*)    Calcium 8.2 (*)    All other components within normal limits    Imaging Review No results found. I have personally reviewed and evaluated these images and lab results as part of my medical decision-making.   EKG Interpretation None      MDM   Final diagnoses:  Epistaxis  Anemia, unspecified anemia type     Labs:WBC 35, hemoglobin 6, platelets 92  Imaging:  Consults:  Therapeutics:  Discharge Meds:   Assessment/Plan: 76 year old female presents today with epistaxis. This is resolved at the time my evaluation. CBC shows a hemoglobin of 6.0, WBC of 35.3 and platelets of 92. Platelets are relatively unchanged from previous, his most recent hemoglobin 7.0. Patient was made aware of these findings and advised that blood transfusion would be indicated. Patient refused this. Patient reports that she is on hospice care, she has a poor quality of life even with near normal blood levels. She reports that she is dying, she knows this, and again refuses any sort of treatment. Patient is competent and capable of making her own decisions. I kindly asked her to even out her oncologist and inform him of today's vital laboratory results and decision. Patient  understood the risks including morbidity and mortality, and that likely this 6.0 continued to drop as this was an acute bleed. Patient understands the risks, she knows that if she has another bleed with significant blood loss she could potentially die. Patient will be discharged home [place], she is instructed to return immediately to the emergency room if bleeding starts again. She is informed that she can return at any time and received her blood transfusion. Patient verbalized understanding and agreement to today's plan had no further questions or concerns at time of discharge         Okey Regal, PA-C 10/29/15 [place], MD 10/30/15 [phone]

## 2015-11-01 ENCOUNTER — Ambulatory Visit: Payer: Medicare Other | Admitting: Oncology

## 2015-11-03 ENCOUNTER — Other Ambulatory Visit: Payer: Medicare Other

## 2015-11-08 IMAGING — CR DG CHEST 2V
2 series · 2 of 2 positions shown · non-contrast
Comparison: Chest x-ray of 11/15/2012

CLINICAL DATA: History of cough for a month, former smoking history

EXAM:
CHEST  2 VIEW

[view not recorded (1 of 2)]
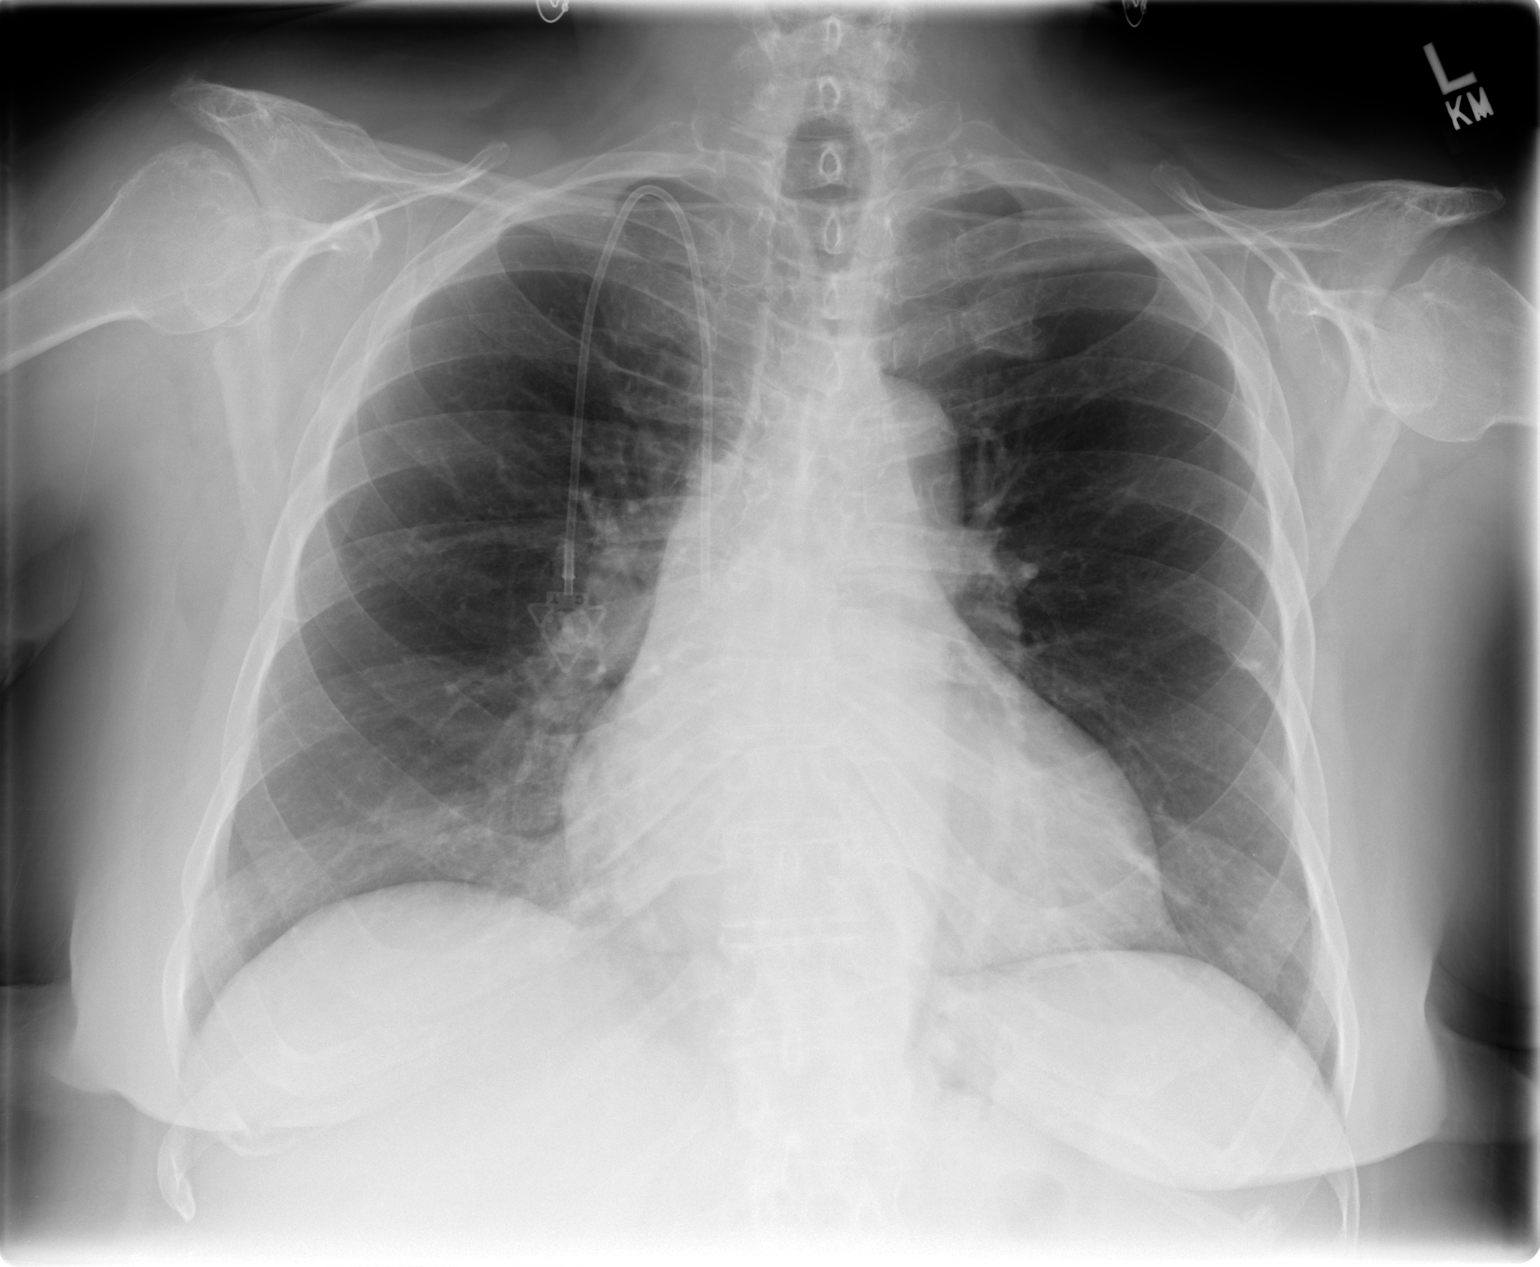

[view not recorded (2 of 2)]
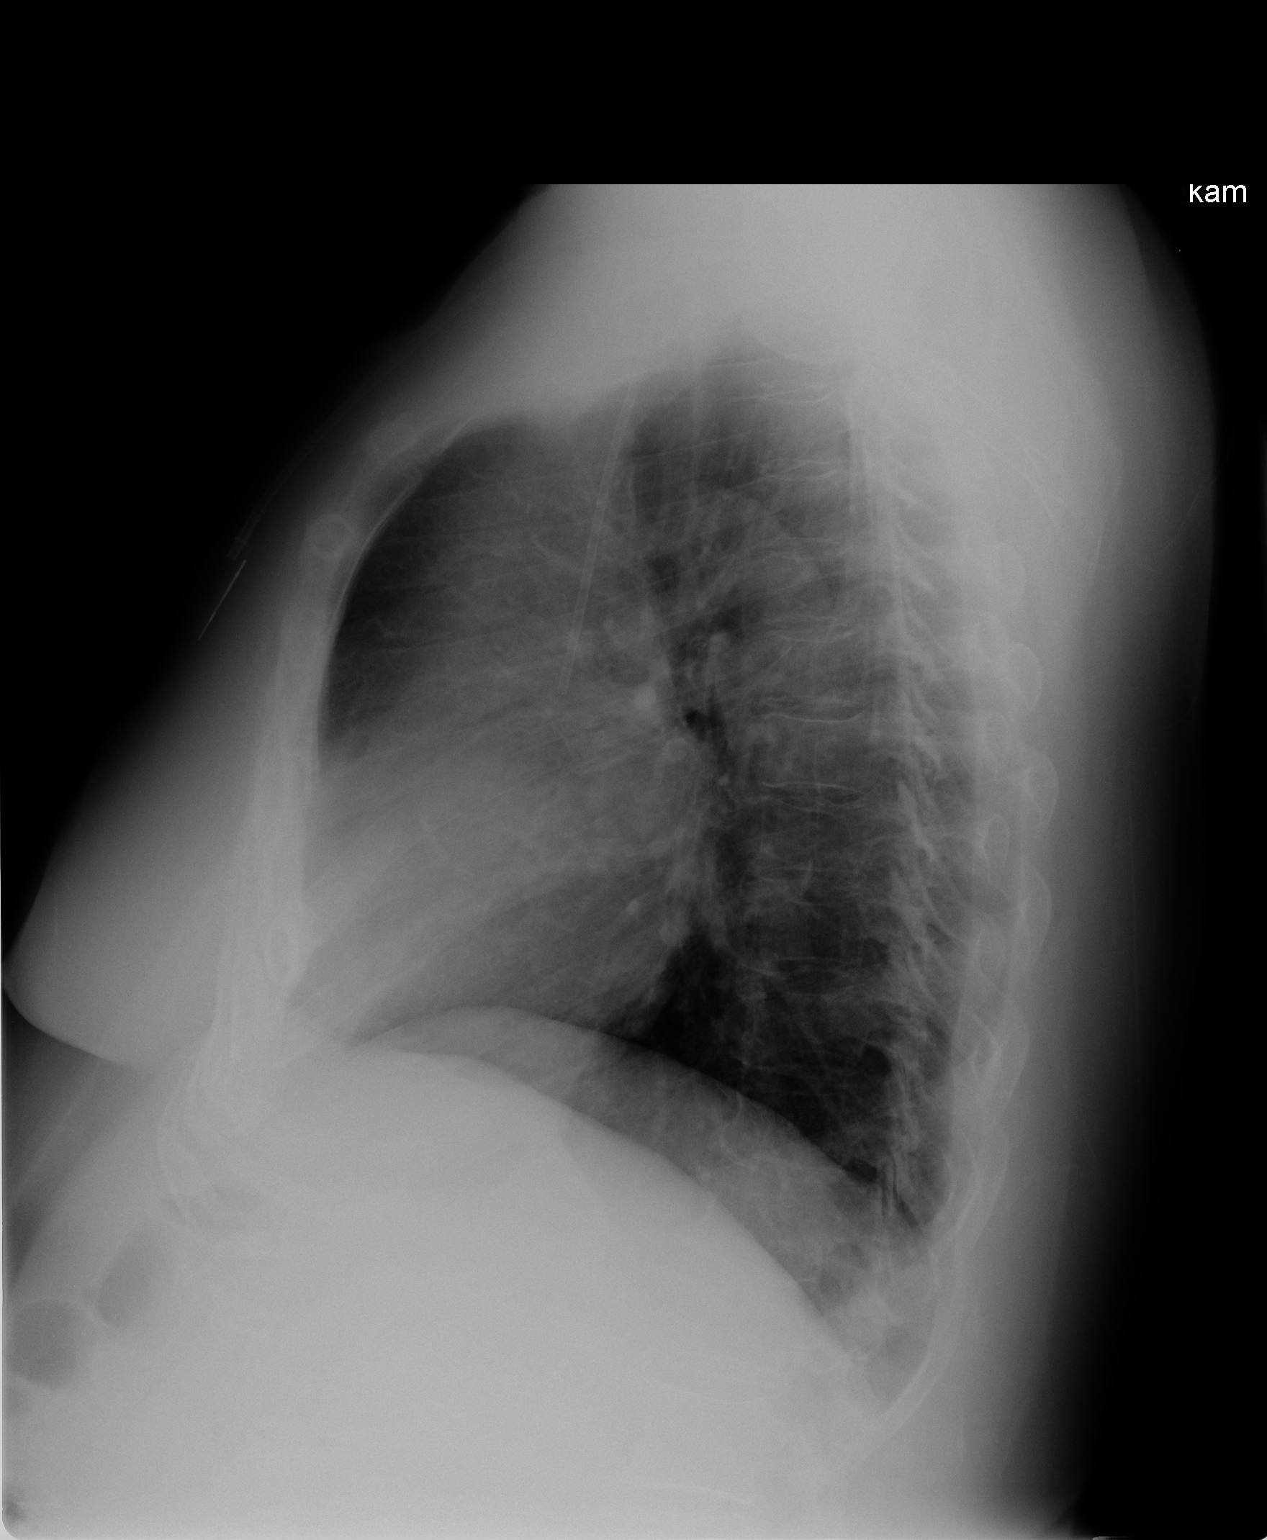

[2 of 2 positions shown; findings below may reference images not displayed]

FINDINGS: No active infiltrate or effusion is seen. Mild cardiomegaly is
stable and mediastinal contours are stable. A right-sided power port
Port-A-Cath is present with the tip within the lower SVC. No bony
abnormality is noted.
IMPRESSION: Stable cardiomegaly.  No active lung disease.

## 2015-11-10 ENCOUNTER — Other Ambulatory Visit: Payer: Medicare Other

## 2015-12-15 DEATH — deceased

## 2015-12-22 ENCOUNTER — Telehealth: Payer: Self-pay | Admitting: Oncology

## 2015-12-22 NOTE — Telephone Encounter (Signed)
HIM informed via staff message from Abelina Bachelor, RN patient died 12-22-15. Patient status changed to deceased.

## 2016-01-10 ENCOUNTER — Ambulatory Visit: Payer: Medicare Other | Admitting: Oncology
